# Patient Record
Sex: Female | Born: 1960 | Race: White | Hispanic: No | Marital: Single | Smoking: Current every day smoker
Health system: Southern US, Community
[De-identification: ages and names within clinical notes are randomized; demographics above are authoritative.]

## PROBLEM LIST (undated history)

## (undated) DIAGNOSIS — R011 Cardiac murmur, unspecified: Secondary | ICD-10-CM

## (undated) DIAGNOSIS — F419 Anxiety disorder, unspecified: Secondary | ICD-10-CM

## (undated) DIAGNOSIS — F32A Depression, unspecified: Secondary | ICD-10-CM

## (undated) DIAGNOSIS — F329 Major depressive disorder, single episode, unspecified: Secondary | ICD-10-CM

## (undated) HISTORY — DX: Anxiety disorder, unspecified: F41.9

## (undated) HISTORY — DX: Depression, unspecified: F32.A

## (undated) HISTORY — DX: Cardiac murmur, unspecified: R01.1

## (undated) HISTORY — PX: FINGER SURGERY: SHX640

## (undated) HISTORY — DX: Major depressive disorder, single episode, unspecified: F32.9

---

## 2008-11-20 ENCOUNTER — Emergency Department (HOSPITAL_COMMUNITY): Admission: EM | Admit: 2008-11-20 | Discharge: 2008-11-20 | Payer: Self-pay | Admitting: Emergency Medicine

## 2010-09-08 LAB — DIFFERENTIAL
Basophils Relative: 0 % (ref 0–1)
Eosinophils Absolute: 0.2 10*3/uL (ref 0.0–0.7)
Lymphs Abs: 2.1 10*3/uL (ref 0.7–4.0)
Neutrophils Relative %: 67 % (ref 43–77)

## 2010-09-08 LAB — BASIC METABOLIC PANEL
BUN: 3 mg/dL — ABNORMAL LOW (ref 6–23)
Chloride: 107 mEq/L (ref 96–112)
Creatinine, Ser: 0.58 mg/dL (ref 0.4–1.2)

## 2010-09-08 LAB — RAPID URINE DRUG SCREEN, HOSP PERFORMED
Amphetamines: NOT DETECTED
Barbiturates: NOT DETECTED
Opiates: NOT DETECTED

## 2010-09-08 LAB — CBC
MCV: 95.2 fL (ref 78.0–100.0)
Platelets: 209 10*3/uL (ref 150–400)
WBC: 8.9 10*3/uL (ref 4.0–10.5)

## 2010-09-08 LAB — ETHANOL: Alcohol, Ethyl (B): 75 mg/dL — ABNORMAL HIGH (ref 0–10)

## 2010-09-08 LAB — PREGNANCY, URINE: Preg Test, Ur: NEGATIVE

## 2011-11-02 ENCOUNTER — Encounter: Payer: Self-pay | Admitting: Pediatrics

## 2016-02-27 DIAGNOSIS — F17200 Nicotine dependence, unspecified, uncomplicated: Secondary | ICD-10-CM | POA: Diagnosis not present

## 2016-02-27 DIAGNOSIS — N200 Calculus of kidney: Secondary | ICD-10-CM | POA: Diagnosis not present

## 2016-02-27 DIAGNOSIS — F329 Major depressive disorder, single episode, unspecified: Secondary | ICD-10-CM | POA: Diagnosis not present

## 2016-02-27 DIAGNOSIS — Z79899 Other long term (current) drug therapy: Secondary | ICD-10-CM | POA: Diagnosis not present

## 2016-08-13 DIAGNOSIS — F17210 Nicotine dependence, cigarettes, uncomplicated: Secondary | ICD-10-CM | POA: Diagnosis not present

## 2016-08-13 DIAGNOSIS — Z008 Encounter for other general examination: Secondary | ICD-10-CM | POA: Diagnosis not present

## 2016-08-13 DIAGNOSIS — Z1389 Encounter for screening for other disorder: Secondary | ICD-10-CM | POA: Diagnosis not present

## 2016-08-13 DIAGNOSIS — Z713 Dietary counseling and surveillance: Secondary | ICD-10-CM | POA: Diagnosis not present

## 2016-08-13 DIAGNOSIS — Z716 Tobacco abuse counseling: Secondary | ICD-10-CM | POA: Diagnosis not present

## 2016-08-18 DIAGNOSIS — Z716 Tobacco abuse counseling: Secondary | ICD-10-CM | POA: Diagnosis not present

## 2016-08-18 DIAGNOSIS — F17210 Nicotine dependence, cigarettes, uncomplicated: Secondary | ICD-10-CM | POA: Diagnosis not present

## 2016-08-24 ENCOUNTER — Encounter: Payer: Self-pay | Admitting: Family Medicine

## 2016-08-24 ENCOUNTER — Ambulatory Visit (INDEPENDENT_AMBULATORY_CARE_PROVIDER_SITE_OTHER): Payer: BLUE CROSS/BLUE SHIELD | Admitting: Family Medicine

## 2016-08-24 VITALS — BP 97/62 | HR 60 | Temp 97.7°F | Ht 61.0 in | Wt 119.0 lb

## 2016-08-24 DIAGNOSIS — Z0000 Encounter for general adult medical examination without abnormal findings: Secondary | ICD-10-CM

## 2016-08-24 DIAGNOSIS — Z124 Encounter for screening for malignant neoplasm of cervix: Secondary | ICD-10-CM

## 2016-08-24 DIAGNOSIS — Z01419 Encounter for gynecological examination (general) (routine) without abnormal findings: Secondary | ICD-10-CM | POA: Diagnosis not present

## 2016-08-24 NOTE — Progress Notes (Signed)
Subjective:  Patient ID: Gina Hendricks, female    DOB: 04-22-1961  Age: 56 y.o. MRN: [id number]  CC: New Patient (Initial Visit) (pt here today to establish care and for CPE/Pap)   HPI Gina Dollyonya D Vest presents for Physical examination. She is new to the practice based on having lived here all of her life but not having primary care because she's only had insurance starting recently. She has some problems with the right shoulder but she is not ready for work up because her insurance has high deductible. She had a history of bronchitis diagnosed 2 years ago [facility]. 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 She still takes Xanax and Paxil for that. She admits not eating right with regard to eating as much as she is supposed to or when she's post due but she eats the right things when she does eat. She is a smoker. She's been smoking about a pack and a half on average for 35 years. That heals approximately a 50-53 year pack year history. She states she had an abnormal Pap smear a long time ago. She went back once after that and it was normal but she delayed and never had a workup for that. Her colonoscopy is up to date having been done 2 years ago or [facility]. She works at [employer] and their Publishing rights managernurse practitioner did some blood work recently. Patient has a copy and agrees to bring that back in. Depression screen PHQ 2/9 08/24/2016  Decreased Interest 1  Down, Depressed, Hopeless 1  PHQ - 2 Score 2  Altered sleeping 1  Tired, decreased energy 1  Change in appetite 2  Feeling bad or failure about yourself  0  Trouble concentrating 0  Moving slowly or fidgety/restless 0  Suicidal thoughts 0  PHQ-9 Score 6    History Gina Hendricks has a past medical history of Anxiety; Depression; and Heart murmur.   She has a past surgical history that includes Finger surgery (Right).   Her family history includes Alcohol abuse in her brother, brother, and maternal grandfather; Arthritis  in her mother; Depression in her brother; Heart disease in her father, maternal grandmother, and paternal grandfather; Vision loss in her mother.She reports that she has been smoking Cigarettes.  She started smoking about 36 years ago. She has been smoking about 1.00 pack per day. She has never used smokeless tobacco. She reports that she does not drink alcohol or use drugs.    ROS Review of Systems  Constitutional: Negative for appetite change, chills, diaphoresis, fatigue, fever and unexpected weight change.  HENT: Negative for congestion, ear pain, hearing loss, postnasal drip, rhinorrhea, sneezing, sore throat and trouble swallowing.   Eyes: Negative for pain.  Respiratory: Negative for cough, chest tightness and shortness of breath.   Cardiovascular: Negative for chest pain and palpitations.  Gastrointestinal: Negative for abdominal pain, constipation, diarrhea, nausea and vomiting.  Endocrine: Negative for cold intolerance, heat intolerance, polydipsia, polyphagia and polyuria.  Genitourinary: Negative for dysuria, frequency and menstrual problem.  Musculoskeletal: Negative for arthralgias and joint swelling.  Skin: Negative for rash.  Allergic/Immunologic: Negative for environmental allergies.  Neurological: Negative for dizziness, weakness, numbness and headaches.  Psychiatric/Behavioral: Negative for agitation and dysphoric mood.    Objective:  BP 97/62   Pulse 60   Temp 97.7 F (36.5 C) (Oral)   Ht 5\' 1"  (1.549 m)   Wt 119 lb (54 kg)   BMI 22.48 kg/m   BP Readings from Last  3 Encounters:  08/24/16 97/62    Wt Readings from Last 3 Encounters:  08/24/16 119 lb (54 kg)     Physical Exam  Constitutional: She is oriented to person, place, and time. She appears well-developed and well-nourished. No distress.  HENT:  Head: Normocephalic and atraumatic.  Right Ear: External ear normal.  Left Ear: External ear normal.  Nose: Nose normal.  Mouth/Throat: Oropharynx is  clear and moist.  Eyes: Conjunctivae and EOM are normal. Pupils are equal, round, and reactive to light.  Neck: Normal range of motion. Neck supple. No thyromegaly present.  Cardiovascular: Normal rate, regular rhythm and normal heart sounds.   No murmur heard. Pulmonary/Chest: Effort normal and breath sounds normal. No respiratory distress. She has no wheezes. She has no rales. Right breast exhibits no inverted nipple, no mass and no tenderness. Left breast exhibits no inverted nipple, no mass and no tenderness. Breasts are symmetrical.  Abdominal: Soft. Normal appearance and bowel sounds are normal. She exhibits no distension, no abdominal bruit and no mass. There is no splenomegaly or hepatomegaly. There is no tenderness. There is no tenderness at McBurney's point and negative Murphy's sign.  Genitourinary: Vagina normal. Rectal exam shows no external hemorrhoid. No breast swelling or tenderness. Pelvic exam was performed with patient supine. There is tenderness on the right labia. There is no rash on the right labia. There is no rash or tenderness on the left labia.  Musculoskeletal: Normal range of motion. She exhibits no edema or tenderness.  Lymphadenopathy:    She has no cervical adenopathy.  Neurological: She is alert and oriented to person, place, and time. She has normal reflexes.  Skin: Skin is warm and dry. No rash noted.  Psychiatric: She has a normal mood and affect. Her behavior is normal. Judgment and thought content normal.      Assessment & Plan:   Allsion was seen today for new patient (initial visit).  Diagnoses and all orders for this visit:  Screening for cervical cancer -     Pap IG w/ reflex to HPV when ASC-U -     MM DIGITAL SCREENING BILATERAL; Future       I am having Gina Hendricks maintain her alprazolam and PARoxetine.  Allergies as of 08/24/2016      Reactions   Zoloft [sertraline Hcl] Other (See Comments)   "out of body experience"      Medication  List       Accurate as of 08/24/16  5:56 PM. Always use your most recent med list.          alprazolam 2 MG tablet Commonly known as:  XANAX Take 2 mg by mouth 3 (three) times daily.   PARoxetine 20 MG tablet Commonly known as:  PAXIL Take 40 mg by mouth daily.        Follow-up: Return in about 1 year (around 08/24/2017).  Mechele Claude, M.D.

## 2016-08-25 LAB — PAP IG W/ RFLX HPV ASCU: PAP SMEAR COMMENT: 0

## 2016-08-26 NOTE — Progress Notes (Signed)
Your recent Pap smear performed in the office came back normal.

## 2016-08-27 DIAGNOSIS — F17210 Nicotine dependence, cigarettes, uncomplicated: Secondary | ICD-10-CM | POA: Diagnosis not present

## 2016-08-27 DIAGNOSIS — Z716 Tobacco abuse counseling: Secondary | ICD-10-CM | POA: Diagnosis not present

## 2016-09-29 DIAGNOSIS — F17210 Nicotine dependence, cigarettes, uncomplicated: Secondary | ICD-10-CM | POA: Diagnosis not present

## 2016-09-29 DIAGNOSIS — Z716 Tobacco abuse counseling: Secondary | ICD-10-CM | POA: Diagnosis not present

## 2016-10-23 ENCOUNTER — Ambulatory Visit (INDEPENDENT_AMBULATORY_CARE_PROVIDER_SITE_OTHER): Payer: BLUE CROSS/BLUE SHIELD | Admitting: Nurse Practitioner

## 2016-10-23 ENCOUNTER — Encounter: Payer: Self-pay | Admitting: Nurse Practitioner

## 2016-10-23 VITALS — BP 112/75 | HR 80 | Temp 97.7°F | Ht 61.0 in | Wt 120.0 lb

## 2016-10-23 DIAGNOSIS — W57XXXA Bitten or stung by nonvenomous insect and other nonvenomous arthropods, initial encounter: Secondary | ICD-10-CM

## 2016-10-23 DIAGNOSIS — A6009 Herpesviral infection of other urogenital tract: Secondary | ICD-10-CM | POA: Diagnosis not present

## 2016-10-23 NOTE — Patient Instructions (Signed)
Insect Bite, Adult An insect bite can make your skin red, itchy, and swollen. Some insects can spread disease to people with a bite. However, most insect bites do not lead to disease, and most are not serious. Follow these instructions at home: Bite area care   Do not scratch the bite area.  Keep the bite area clean and dry.  Wash the bite area every day with soap and water as told by your doctor.  Check the bite area every day for signs of infection. Check for:  More redness, swelling, or pain.  Fluid or blood.  Warmth.  Pus. Managing pain, itching, and swelling   You may put any of these on the bite area as told by your doctor:  A baking soda paste.  Cortisone cream.  Calamine lotion.  If directed, put ice on the bite area.  Put ice in a plastic bag.  Place a towel between your skin and the bag.  Leave the ice on for 20 minutes, 2-3 times a day. Medicines   Take medicines or put medicines on your skin only as told by your doctor.  If you were prescribed an antibiotic medicine, use it as told by your doctor. Do not stop using the antibiotic even if your condition improves. General instructions   Keep all follow-up visits as told by your doctor. This is important. How is this prevented? To help you have a lower risk of insect bites:  When you are outside, wear clothing that covers your arms and legs.  Use insect repellent. The best insect repellents have:  An active ingredient of DEET, picaridin, oil of lemon eucalyptus (OLE), or IR3535.  Higher amounts of DEET or another active ingredient than other repellents have.  If your home windows do not have screens, think about putting some in. Contact a doctor if:  You have more redness, swelling, or pain in the bite area.  You have fluid, blood, or pus coming from the bite area.  The bite area feels warm.  You have a fever. Get help right away if:  You have joint pain.  You have a rash.  You have  shortness of breath.  You feel more tired or sleepy than you normally do.  You have neck pain.  You have a headache.  You feel weaker than you normally do.  You have chest pain.  You have pain in your belly.  You feel sick to your stomach (nauseous) or you throw up (vomit). Summary  An insect bite can make your skin red, itchy, and swollen.  Do not scratch the bite area, and keep it clean and dry.  Ice can help with pain and itching from the bite. This information is not intended to replace advice given to you by your health care provider. Make sure you discuss any questions you have with your health care provider. Document Released: 05/15/2000 Document Revised: 12/19/2015 Document Reviewed: 10/03/2014 Elsevier Interactive Patient Education  2017 Elsevier Inc.  

## 2016-10-23 NOTE — Progress Notes (Signed)
   Subjective:    Patient ID: Gina Hendricks, female    DOB: [birth year], 56 y.o.   MRN: [id number]  HPI Patient brings herself in today stating that her step father passed away in January and her and her mother moved in together. Since January she has had two episodes of vesicular patchy lesions on right buttocks up thigh area- was very painful at time of occurrence- now it has completely resolved. SHe now has scattered bites on her trunk that she thinks re bed bug bites because her mom said they had some bedbugs in their previous home.    Review of Systems  Constitutional: Negative.   Respiratory: Negative.   Cardiovascular: Negative.   Genitourinary: Negative.   Musculoskeletal: Negative.   Skin: Positive for rash.  Neurological: Negative.   Psychiatric/Behavioral: Negative.   All other systems reviewed and are negative.      Objective:   Physical Exam  Constitutional: She is oriented to person, place, and time. She appears well-developed and well-nourished. No distress.  Cardiovascular: Normal rate and regular rhythm.   Pulmonary/Chest: Effort normal and breath sounds normal.  Neurological: She is alert and oriented to person, place, and time.  Skin: Skin is warm. Rash (scattered maculopapular erythematous lesions tha tapper to have been scratched scattered on posterior trunk.) noted.  Psychiatric: She has a normal mood and affect. Her behavior is normal. Judgment and thought content normal.    BP 112/75 (BP Location: Right Arm, Patient Position: Sitting, Cuff Size: Normal)   Pulse 80   Temp 97.7 F (36.5 C) (Oral)   Ht 5\' 1"  (1.549 m)   Wt 120 lb (54.4 kg)   BMI 22.67 kg/m      Assessment & Plan:  1. 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

## 2016-10-30 LAB — HSV TYPE I/II IGG, IGMW/ REFLEX
HSV 1 Glycoprotein G Ab, IgG: 27.5 index — ABNORMAL HIGH (ref 0.00–0.90)
HSV 1 IgM: <1:10 {titer}
HSV 2 Glycoprotein G Ab, IgG: 12.1 index — ABNORMAL HIGH (ref 0.00–0.90)

## 2016-11-02 ENCOUNTER — Telehealth: Payer: Self-pay | Admitting: Nurse Practitioner

## 2016-11-02 MED ORDER — VALACYCLOVIR HCL 500 MG PO TABS: 500.0000 mg | ORAL_TABLET | Freq: Two times a day (BID) | ORAL | 3 refills | Status: DC

## 2016-11-02 NOTE — Telephone Encounter (Signed)
Patient notified of HSV labwork and she states that she currently has a break out. [facility] [facility] [place]. Please advise

## 2016-11-02 NOTE — Telephone Encounter (Signed)
Valtrex rx sent to pharmacy.

## 2016-11-02 NOTE — Telephone Encounter (Signed)
Pt notified of RX 

## 2016-12-08 DIAGNOSIS — Z008 Encounter for other general examination: Secondary | ICD-10-CM | POA: Diagnosis not present

## 2016-12-08 DIAGNOSIS — Z716 Tobacco abuse counseling: Secondary | ICD-10-CM | POA: Diagnosis not present

## 2016-12-08 DIAGNOSIS — Z719 Counseling, unspecified: Secondary | ICD-10-CM | POA: Diagnosis not present

## 2016-12-08 DIAGNOSIS — F17210 Nicotine dependence, cigarettes, uncomplicated: Secondary | ICD-10-CM | POA: Diagnosis not present

## 2016-12-28 ENCOUNTER — Encounter: Payer: BLUE CROSS/BLUE SHIELD | Admitting: *Deleted

## 2016-12-31 DIAGNOSIS — Z716 Tobacco abuse counseling: Secondary | ICD-10-CM | POA: Diagnosis not present

## 2016-12-31 DIAGNOSIS — Z719 Counseling, unspecified: Secondary | ICD-10-CM | POA: Diagnosis not present

## 2016-12-31 DIAGNOSIS — F17210 Nicotine dependence, cigarettes, uncomplicated: Secondary | ICD-10-CM | POA: Diagnosis not present

## 2017-02-17 ENCOUNTER — Telehealth: Payer: Self-pay | Admitting: Family Medicine

## 2017-02-17 NOTE — Telephone Encounter (Signed)
Patient will call back and set up mammo

## 2017-03-09 DIAGNOSIS — F17210 Nicotine dependence, cigarettes, uncomplicated: Secondary | ICD-10-CM | POA: Diagnosis not present

## 2017-03-09 DIAGNOSIS — Z719 Counseling, unspecified: Secondary | ICD-10-CM | POA: Diagnosis not present

## 2017-03-09 DIAGNOSIS — Z716 Tobacco abuse counseling: Secondary | ICD-10-CM | POA: Diagnosis not present

## 2017-05-17 ENCOUNTER — Ambulatory Visit: Payer: BLUE CROSS/BLUE SHIELD | Admitting: Nurse Practitioner

## 2017-05-18 ENCOUNTER — Encounter: Payer: Self-pay | Admitting: Nurse Practitioner

## 2017-05-18 ENCOUNTER — Ambulatory Visit (INDEPENDENT_AMBULATORY_CARE_PROVIDER_SITE_OTHER): Payer: BLUE CROSS/BLUE SHIELD | Admitting: Nurse Practitioner

## 2017-05-18 VITALS — BP 121/84 | HR 77 | Temp 97.2°F | Ht 61.0 in | Wt 124.0 lb

## 2017-05-18 DIAGNOSIS — R319 Hematuria, unspecified: Secondary | ICD-10-CM

## 2017-05-18 DIAGNOSIS — Z23 Encounter for immunization: Secondary | ICD-10-CM

## 2017-05-18 LAB — URINALYSIS, COMPLETE
BILIRUBIN UA: NEGATIVE
GLUCOSE, UA: NEGATIVE
KETONES UA: NEGATIVE
Leukocytes, UA: NEGATIVE
Nitrite, UA: NEGATIVE
PH UA: 6 (ref 5.0–7.5)
PROTEIN UA: NEGATIVE
RBC UA: NEGATIVE
Specific Gravity, UA: <1.005 — ABNORMAL LOW (ref 1.005–1.030)
UUROB: 0.2 mg/dL (ref 0.2–1.0)

## 2017-05-18 LAB — MICROSCOPIC EXAMINATION
RBC, UA: NONE SEEN /hpf (ref 0–?)
Renal Epithel, UA: NONE SEEN /hpf

## 2017-05-18 NOTE — Progress Notes (Signed)
   Subjective:    Patient ID: Gina Hendricks, female    DOB: [birth year], 56 y.o.   MRN: [id number]  HPI Patient in today c/o dysuira and urinary frequency that started a few days ago.    Review of Systems  Respiratory: Negative.   Cardiovascular: Negative.   Gastrointestinal: Negative.   Genitourinary: Positive for dysuria, frequency, hematuria and urgency.  Neurological: Negative.   Psychiatric/Behavioral: Negative.   All other systems reviewed and are negative.      Objective:   Physical Exam  Constitutional: She is oriented to person, place, and time. She appears well-developed and well-nourished. No distress.  Cardiovascular: Normal rate and regular rhythm.  Pulmonary/Chest: Effort normal and breath sounds normal.  Abdominal: Soft. Bowel sounds are normal. There is tenderness (mild suprapubic pain on palpation).  Genitourinary:  Genitourinary Comments: No CVA tenderness  Neurological: She is alert and oriented to person, place, and time.  Skin: Skin is warm.  Psychiatric: She has a normal mood and affect. Her behavior is normal. Judgment and thought content normal.   BP 121/84   Pulse 77   Temp (!) 97.2 F (36.2 C) (Oral)   Ht 5\' 1"  (1.549 m)   Wt 124 lb (56.2 kg)   BMI 23.43 kg/m   Urine clear      Assessment & Plan:   1. Hematuria, unspecified type    Urine clear today Patient was told if continues to see blood needs pelvic exam  Mary-Margaret Daphine DeutscherMartin, FNP

## 2017-05-18 NOTE — Patient Instructions (Signed)
Postmenopausal Bleeding Postmenopausal bleeding is any bleeding after menopause. Menopause is when a woman's period stops. Any type of bleeding after menopause is concerning. It should be checked by your doctor. Any treatment will depend on the cause. Follow these instructions at home: Watch your condition for any changes.  Avoid the use of tampons and douches as told by your doctor.  Change your pads often.  Get regular pelvic exams and Pap tests.  Keep all appointments for tests as told by your doctor.  Contact a doctor if:  Your bleeding lasts for more than 1 week.  You have belly (abdominal) pain.  You have bleeding after sex (intercourse). Get help right away if:  You have a fever, chills, a headache, dizziness, muscle aches, and bleeding.  You have strong pain with bleeding.  You have clumps of blood (blood clots) coming from your vagina.  You have bleeding and need more than 1 pad an hour.  You feel like you are going to pass out (faint). This information is not intended to replace advice given to you by your health care provider. Make sure you discuss any questions you have with your health care provider. Document Released: 02/25/2008 Document Revised: 10/24/2015 Document Reviewed: 12/15/2012 Elsevier Interactive Patient Education  2017 Elsevier Inc.  

## 2017-05-21 DIAGNOSIS — Z23 Encounter for immunization: Secondary | ICD-10-CM

## 2017-06-03 DIAGNOSIS — Z008 Encounter for other general examination: Secondary | ICD-10-CM | POA: Diagnosis not present

## 2017-06-03 DIAGNOSIS — Z716 Tobacco abuse counseling: Secondary | ICD-10-CM | POA: Diagnosis not present

## 2017-06-03 DIAGNOSIS — Z719 Counseling, unspecified: Secondary | ICD-10-CM | POA: Diagnosis not present

## 2017-06-03 DIAGNOSIS — F17210 Nicotine dependence, cigarettes, uncomplicated: Secondary | ICD-10-CM | POA: Diagnosis not present

## 2017-08-09 ENCOUNTER — Ambulatory Visit (INDEPENDENT_AMBULATORY_CARE_PROVIDER_SITE_OTHER): Payer: BLUE CROSS/BLUE SHIELD | Admitting: Family Medicine

## 2017-08-09 ENCOUNTER — Encounter: Payer: Self-pay | Admitting: Family Medicine

## 2017-08-09 ENCOUNTER — Ambulatory Visit (INDEPENDENT_AMBULATORY_CARE_PROVIDER_SITE_OTHER): Payer: BLUE CROSS/BLUE SHIELD

## 2017-08-09 VITALS — BP 95/58 | HR 83 | Temp 97.0°F | Ht 61.0 in | Wt 128.0 lb

## 2017-08-09 DIAGNOSIS — R0609 Other forms of dyspnea: Secondary | ICD-10-CM

## 2017-08-09 DIAGNOSIS — E782 Mixed hyperlipidemia: Secondary | ICD-10-CM

## 2017-08-09 DIAGNOSIS — R319 Hematuria, unspecified: Secondary | ICD-10-CM

## 2017-08-09 DIAGNOSIS — R05 Cough: Secondary | ICD-10-CM | POA: Diagnosis not present

## 2017-08-09 DIAGNOSIS — Z0000 Encounter for general adult medical examination without abnormal findings: Secondary | ICD-10-CM | POA: Diagnosis not present

## 2017-08-09 LAB — URINALYSIS, COMPLETE
BILIRUBIN UA: NEGATIVE
Glucose, UA: NEGATIVE
Ketones, UA: NEGATIVE
LEUKOCYTES UA: NEGATIVE
Nitrite, UA: NEGATIVE
PH UA: 6 (ref 5.0–7.5)
PROTEIN UA: NEGATIVE
RBC, UA: NEGATIVE
Specific Gravity, UA: 1.01 (ref 1.005–1.030)
Urobilinogen, Ur: 0.2 mg/dL (ref 0.2–1.0)

## 2017-08-09 LAB — MICROSCOPIC EXAMINATION
BACTERIA UA: NONE SEEN
RBC, UA: NONE SEEN /hpf (ref 0–?)
RENAL EPITHEL UA: NONE SEEN /HPF

## 2017-08-09 NOTE — Progress Notes (Signed)
Subjective:  Patient ID: Gina Hendricks, female    DOB: 1960-12-14  Age: 57 y.o. MRN: [id number]  CC: Annual Exam   HPI Gina Hendricks presents for annual exam.  She had a Pap smear last year that was normal here.  She is due for mammogram but is scared to get it done.  She has blood work done at her workplace and 5 but she did not have that sent over here nor did she bring it.  She says that her LDL went up some this year.  She thinks it was 132.  Patient is a smoker at 1 pack/day.  She cannot try Chantix because she has had suicidal thoughts and deviation in the past.  She is currently under the care of psychiatry for anxiety using Xanax 2 mg 3 times daily along with her Paxil.  She reports that she does get short of breath easily with strenuous activity.  Depression screen [facility] 2/9 08/09/2017 05/18/2017 08/24/2016  Decreased Interest 0 0 1  Down, Depressed, Hopeless 0 0 1  PHQ - 2 Score 0 0 2  Altered sleeping - - 1  Tired, decreased energy - - 1  Change in appetite - - 2  Feeling bad or failure about yourself  - - 0  Trouble concentrating - - 0  Moving slowly or fidgety/restless - - 0  Suicidal thoughts - - 0  PHQ-9 Score - - 6    History Daphney has a past medical history of Anxiety, Depression, and Heart murmur.   She has a past surgical history that includes Finger surgery (Right).   Her family history includes Alcohol abuse in her brother, brother, and maternal grandfather; Arthritis in her mother; Depression in her brother; Heart disease in her father, maternal grandmother, and paternal grandfather; Vision loss in her mother.She reports that she has been smoking cigarettes.  She started smoking about 36 years ago. She has been smoking about 1.00 pack per day. she has never used smokeless tobacco. She reports that she does not drink alcohol or use drugs.    ROS Review of Systems  Constitutional: Negative for appetite change, chills, diaphoresis, fatigue, fever and unexpected  weight change.  HENT: Negative for congestion, ear pain, hearing loss, postnasal drip, rhinorrhea, sneezing, sore throat and trouble swallowing.   Eyes: Negative for pain.  Respiratory: Positive for shortness of breath. Negative for cough, chest tightness and wheezing.   Cardiovascular: Negative for chest pain and palpitations.       Patient was told 20 years ago that she had a heart murmur.  It has never been symptomatic.  Gastrointestinal: Negative for abdominal pain, constipation, diarrhea, nausea and vomiting.  Endocrine: Negative for cold intolerance, heat intolerance, polydipsia, polyphagia and polyuria.  Genitourinary: Positive for hematuria (Patient was recently seen in the office for hematuria.  Her urine specimen was negative.  She is concerned that it could come back.). Negative for dysuria, frequency and menstrual problem.  Musculoskeletal: Negative for arthralgias and joint swelling.  Skin: Negative for rash.  Allergic/Immunologic: Negative for environmental allergies.  Neurological: Negative for dizziness, weakness, numbness and headaches.  Psychiatric/Behavioral: Negative for agitation and dysphoric mood. The patient is nervous/anxious.     Objective:  BP (!) 95/58   Pulse 83   Temp (!) 97 F (36.1 C) (Oral)   Ht 5\' 1"  (1.549 m)   Wt 128 lb (58.1 kg)   BMI 24.19 kg/m   BP Readings from Last 3 Encounters:  08/09/17 (!) 95/58  05/18/17 121/84  10/23/16 112/75    Wt Readings from Last 3 Encounters:  08/09/17 128 lb (58.1 kg)  05/18/17 124 lb (56.2 kg)  10/23/16 120 lb (54.4 kg)     Physical Exam  Constitutional: She is oriented to person, place, and time. She appears well-developed and well-nourished. No distress.  HENT:  Head: Normocephalic and atraumatic.  Right Ear: External ear normal.  Left Ear: External ear normal.  Nose: Nose normal.  Mouth/Throat: Oropharynx is clear and moist.  Eyes: Conjunctivae and EOM are normal. Pupils are equal, round, and  reactive to light.  Neck: Normal range of motion. Neck supple. No thyromegaly present.  Cardiovascular: Normal rate and regular rhythm.  Murmur (2+ early systolic murmur) heard. Pulmonary/Chest: Effort normal and breath sounds normal. No respiratory distress. She has no wheezes. She has no rales. Right breast exhibits no inverted nipple, no mass and no tenderness. Left breast exhibits no inverted nipple, no mass and no tenderness. Breasts are symmetrical.  Abdominal: Soft. Normal appearance and bowel sounds are normal. She exhibits no distension, no abdominal bruit and no mass. There is no splenomegaly or hepatomegaly. There is no tenderness. There is no tenderness at McBurney's point and negative Murphy's sign.  Musculoskeletal: Normal range of motion. She exhibits no edema or tenderness.  Lymphadenopathy:    She has no cervical adenopathy.  Neurological: She is alert and oriented to person, place, and time. She has normal reflexes.  Skin: Skin is warm and dry. No rash noted.  Psychiatric: She has a normal mood and affect. Her behavior is normal. Judgment and thought content normal.      Assessment & Plan:   Archie Pattenonya was seen today for annual exam.  Diagnoses and all orders for this visit:  Well adult exam  Hematuria, unspecified type -     Urinalysis, Complete  Dyspnea on exertion -     PR BREATHING CAPACITY TEST -     DG Chest 2 View; Future  Mixed hyperlipidemia       I am having Emalina D. Rovner maintain her alprazolam, PARoxetine, and valACYclovir.  Allergies as of 08/09/2017      Reactions   Zoloft [sertraline Hcl] Other (See Comments)   "out of body experience"      Medication List        Accurate as of 08/09/17  3:14 PM. Always use your most recent med list.          alprazolam 2 MG tablet Commonly known as:  XANAX Take 2 mg by mouth 3 (three) times daily.   PARoxetine 20 MG tablet Commonly known as:  PAXIL Take 40 mg by mouth daily.   valACYclovir 500  MG tablet Commonly known as:  VALTREX Take 1 tablet (500 mg total) by mouth 2 (two) times daily.      Alprazolam prescriber is Dr. [place] Sizerupender Kaur.  Smoking cessation reviewed in detail.  Low-fat diet reviewed as well.  Follow-up: Return in about 6 months (around 02/09/2018).  Mechele ClaudeWarren Tychelle Purkey, M.D.

## 2017-08-09 NOTE — Patient Instructions (Signed)
Fat and Cholesterol Restricted Diet High levels of fat and cholesterol in your blood may lead to various health problems, such as diseases of the heart, blood vessels, gallbladder, liver, and pancreas. Fats are concentrated sources of energy that come in various forms. Certain types of fat, including saturated fat, may be harmful in excess. Cholesterol is a substance needed by your body in small amounts. Your body makes all the cholesterol it needs. Excess cholesterol comes from the food you eat. When you have high levels of cholesterol and saturated fat in your blood, health problems can develop because the excess fat and cholesterol will gather along the walls of your blood vessels, causing them to narrow. Choosing the right foods will help you control your intake of fat and cholesterol. This will help keep the levels of these substances in your blood within normal limits and reduce your risk of disease. What is my plan? Your health care provider recommends that you:  Limit your fat intake to ___30___% or less of your total calories per day.  Eat 20-30 grams of fiber each day.  What types of fat should I choose?  Choose healthy fats more often. Choose monounsaturated and polyunsaturated fats, such as olive and canola oil, flaxseeds, walnuts, almonds, and seeds.  Eat more omega-3 fats. Good choices include salmon, mackerel, sardines, tuna, flaxseed oil, and ground flaxseeds. Aim to eat fish at least two times a week.  Limit saturated fats. Saturated fats are primarily found in animal products, such as meats, butter, and cream. Plant sources of saturated fats include palm oil, palm kernel oil, and coconut oil.  Avoid foods with partially hydrogenated oils in them. These contain trans fats. Examples of foods that contain trans fats are stick margarine, some tub margarines, cookies, crackers, and other baked goods. What general guidelines do I need to follow? These guidelines for healthy eating  will help you control your intake of fat and cholesterol:  Check food labels carefully to identify foods with trans fats or high amounts of saturated fat.  Fill one half of your plate with vegetables and green salads.  Fill one fourth of your plate with whole grains. Look for the word "whole" as the first word in the ingredient list.  Fill one fourth of your plate with lean protein foods.  Limit fruit to two servings a day. Choose fruit instead of juice.  Eat more foods that contain fiber, such as apples, broccoli, carrots, beans, peas, and barley.  Eat more home-cooked food and less restaurant, buffet, and fast food.  Limit or avoid alcohol.  Limit foods high in starch and sugar.  Limit fried foods.  Cook foods using methods other than frying. Baking, boiling, grilling, and broiling are all great options.  Lose weight if you are overweight. Losing just 5-10% of your initial body weight can help your overall health and prevent diseases such as diabetes and heart disease.  What foods can I eat? Grains  Whole grains, such as whole wheat or whole grain breads, crackers, cereals, and pasta. Unsweetened oatmeal, bulgur, barley, quinoa, or brown rice. Corn or whole wheat flour tortillas. Vegetables  Fresh or frozen vegetables (raw, steamed, roasted, or grilled). Green salads. Fruits  All fresh, canned (in natural juice), or frozen fruits. Meats and other protein foods  Ground beef (85% or leaner), grass-fed beef, or beef trimmed of fat. Skinless chicken or [place]. Ground chicken or [place]. Pork trimmed of fat. All fish and seafood. Eggs. Dried beans, peas, or lentils. Unsalted  nuts or seeds. Unsalted canned or dry beans. Dairy  Low-fat dairy products, such as skim or 1% milk, 2% or reduced-fat cheeses, low-fat ricotta or cottage cheese, or plain low-fat yo Fats and oils  Tub margarines without trans fats. Light or reduced-fat mayonnaise and salad dressings. Avocado. Olive,  canola, sesame, or safflower oils. Natural peanut or [place] butter (choose ones without added sugar and oil). The items listed above may not be a complete list of recommended foods or beverages. Contact your dietitian for more options. Foods to avoid Grains  White bread. White pasta. White rice. Cornbread. Bagels, pastries, and croissants. Crackers that contain trans fat. Vegetables  White potatoes. Corn. Creamed or fried vegetables. Vegetables in a cheese sauce. Fruits  Dried fruits. Canned fruit in light or heavy syrup. Fruit juice. Meats and other protein foods  Fatty cuts of meat. Ribs, chicken wings, bacon, sausage, bologna, salami, chitterlings, fatback, hot dogs, bratwurst, and packaged luncheon meats. Liver and organ meats. Dairy  Whole or 2% milk, cream, half-and-half, and cream cheese. Whole milk cheeses. Whole-fat or sweetened yogurt. Full-fat cheeses. Nondairy creamers and whipped toppings. Processed cheese, cheese spreads, or cheese curds. Beverages  Alcohol. Sweetened drinks (such as sodas, lemonade, and fruit drinks or punches). Fats and oils  Butter, stick margarine, lard, shortening, ghee, or bacon fat. Coconut, palm kernel, or palm oils. Sweets and desserts  Corn syrup, sugars, honey, and molasses. Candy. Jam and jelly. Syrup. Sweetened cereals. Cookies, pies, cakes, donuts, muffins, and ice cream. The items listed above may not be a complete list of foods and beverages to avoid. Contact your dietitian for more information. This information is not intended to replace advice given to you by your health care provider. Make sure you discuss any questions you have with your health care provider. Document Released: 05/18/2005 Document Revised: 06/08/2014 Document Reviewed: 08/16/2013 Elsevier Interactive Patient Education  [employer]2018 Elsevier Inc.

## 2017-08-10 ENCOUNTER — Other Ambulatory Visit: Payer: Self-pay | Admitting: Family Medicine

## 2017-08-10 MED ORDER — FLUTICASONE FUROATE-VILANTEROL 200-25 MCG/INH IN AEPB: 1.0000 | INHALATION_SPRAY | Freq: Every day | RESPIRATORY_TRACT | 11 refills | Status: DC

## 2017-08-12 DIAGNOSIS — Z716 Tobacco abuse counseling: Secondary | ICD-10-CM | POA: Diagnosis not present

## 2017-08-12 DIAGNOSIS — Z139 Encounter for screening, unspecified: Secondary | ICD-10-CM | POA: Diagnosis not present

## 2017-08-12 DIAGNOSIS — F17210 Nicotine dependence, cigarettes, uncomplicated: Secondary | ICD-10-CM | POA: Diagnosis not present

## 2017-08-26 DIAGNOSIS — Z716 Tobacco abuse counseling: Secondary | ICD-10-CM | POA: Diagnosis not present

## 2017-08-26 DIAGNOSIS — F17210 Nicotine dependence, cigarettes, uncomplicated: Secondary | ICD-10-CM | POA: Diagnosis not present

## 2017-08-26 DIAGNOSIS — E782 Mixed hyperlipidemia: Secondary | ICD-10-CM | POA: Diagnosis not present

## 2017-08-26 DIAGNOSIS — E559 Vitamin D deficiency, unspecified: Secondary | ICD-10-CM | POA: Diagnosis not present

## 2017-09-25 ENCOUNTER — Other Ambulatory Visit: Payer: Self-pay

## 2017-09-25 ENCOUNTER — Encounter (HOSPITAL_COMMUNITY): Payer: Self-pay | Admitting: Emergency Medicine

## 2017-09-25 ENCOUNTER — Emergency Department (HOSPITAL_COMMUNITY)
Admission: EM | Admit: 2017-09-25 | Discharge: 2017-09-25 | Disposition: A | Discharge status: Home / Self Care | Payer: BLUE CROSS/BLUE SHIELD | Attending: Emergency Medicine | Admitting: Emergency Medicine

## 2017-09-25 ENCOUNTER — Emergency Department (HOSPITAL_COMMUNITY): Payer: BLUE CROSS/BLUE SHIELD

## 2017-09-25 DIAGNOSIS — S60222A Contusion of left hand, initial encounter: Secondary | ICD-10-CM | POA: Diagnosis not present

## 2017-09-25 DIAGNOSIS — Y9389 Activity, other specified: Secondary | ICD-10-CM | POA: Insufficient documentation

## 2017-09-25 DIAGNOSIS — Y929 Unspecified place or not applicable: Secondary | ICD-10-CM | POA: Diagnosis not present

## 2017-09-25 DIAGNOSIS — F17210 Nicotine dependence, cigarettes, uncomplicated: Secondary | ICD-10-CM | POA: Diagnosis not present

## 2017-09-25 DIAGNOSIS — Z79899 Other long term (current) drug therapy: Secondary | ICD-10-CM | POA: Insufficient documentation

## 2017-09-25 DIAGNOSIS — S62663A Nondisplaced fracture of distal phalanx of left middle finger, initial encounter for closed fracture: Secondary | ICD-10-CM | POA: Diagnosis not present

## 2017-09-25 DIAGNOSIS — S6992XA Unspecified injury of left wrist, hand and finger(s), initial encounter: Secondary | ICD-10-CM | POA: Diagnosis present

## 2017-09-25 DIAGNOSIS — S62633A Displaced fracture of distal phalanx of left middle finger, initial encounter for closed fracture: Secondary | ICD-10-CM | POA: Diagnosis not present

## 2017-09-25 DIAGNOSIS — Y999 Unspecified external cause status: Secondary | ICD-10-CM | POA: Insufficient documentation

## 2017-09-25 DIAGNOSIS — S60032A Contusion of left middle finger without damage to nail, initial encounter: Secondary | ICD-10-CM | POA: Diagnosis not present

## 2017-09-25 NOTE — Discharge Instructions (Signed)
See Dr. Romeo Apple for recheck and repeat xray in 1 week
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
## 2017-09-25 NOTE — ED Notes (Signed)
AC looking for small/xs thumb spica.

## 2017-09-25 NOTE — ED Notes (Signed)
Pt taken to xray 
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
## 2017-10-07 DIAGNOSIS — F17210 Nicotine dependence, cigarettes, uncomplicated: Secondary | ICD-10-CM | POA: Diagnosis not present

## 2017-10-07 DIAGNOSIS — Z716 Tobacco abuse counseling: Secondary | ICD-10-CM | POA: Diagnosis not present

## 2017-11-08 ENCOUNTER — Ambulatory Visit (INDEPENDENT_AMBULATORY_CARE_PROVIDER_SITE_OTHER): Payer: BLUE CROSS/BLUE SHIELD | Admitting: Family Medicine

## 2017-11-08 ENCOUNTER — Encounter: Payer: Self-pay | Admitting: Family Medicine

## 2017-11-08 ENCOUNTER — Ambulatory Visit (INDEPENDENT_AMBULATORY_CARE_PROVIDER_SITE_OTHER): Payer: BLUE CROSS/BLUE SHIELD

## 2017-11-08 VITALS — BP 121/79 | HR 90 | Temp 98.6°F | Ht 60.0 in

## 2017-11-08 DIAGNOSIS — S8992XA Unspecified injury of left lower leg, initial encounter: Secondary | ICD-10-CM

## 2017-11-08 DIAGNOSIS — M25562 Pain in left knee: Secondary | ICD-10-CM

## 2017-11-08 DIAGNOSIS — M7042 Prepatellar bursitis, left knee: Secondary | ICD-10-CM | POA: Diagnosis not present

## 2017-11-08 NOTE — Patient Instructions (Signed)
Medial Collateral Knee Ligament Sprain  The medial collateral ligament (MCL) is a tough band of tissue in the knee that connects the thigh bone to the shin bone. Your MCL prevents your knee from moving too far inward and helps to keep your knee stable. An MCL sprain is an injury that is caused by stretching the MCL too far. The injury can involve a tear in the MCL.  What are the causes?  This condition may be caused by:  · A hard, direct hit (blow) to the inside of your knee (common).  · Your knee falling inward when you run, change directions quickly (cut), jump, or pivot.  · Repeatedly overstretching the MCL.    What increases the risk?  The following factors make you more likely to develop this condition:  · Playing contact sports, such as wrestling or football.  · Participating in sports that involve cutting, like hockey, skiing, or soccer.  · Having weak hip and core muscles.    What are the signs or symptoms?  Symptoms of this condition include:  · A popping sound at the time of injury.  · Pain on the inside of the knee.  · Swelling in the knee.  · Bruising around the knee.  · Tenderness when pressing the inside of the knee.  · Feeling unstable when you stand, like your knee will give way.  · Difficulty walking on uneven surfaces.    How is this diagnosed?  This condition may be diagnosed based on:  · Your medical history.  · A physical exam.  · Tests, such as an X-ray or MRI.    During your physical exam, your health care provider will check for pain, limited motion, and instability.  How is this treated?  Treatment for this condition depends on how severe the injury is. Treatment may include:  · Keeping weight off the knee until swelling and pain improve.  · Raising (elevating) the knee above the level of your heart. This helps to reduce swelling.  · Icing the knee. This helps to reduce swelling.  · Taking an NSAID. This helps to reduce pain and swelling.  · Using a knee brace, elastic sleeve, or crutches  while the injury heals.  · Using a knee brace when participating in athletic activities.  · Doing rehab exercises (physical therapy).  · Surgery. This may be needed if:  ? Your MCL tore all the way through.  ? Your knee is unstable.  ? Your knee is not getting better with other treatments.    Follow these instructions at home:  If you have a brace or sleeve:  · Wear it as told by your health care provider. Remove it only as told by your health care provider.  · Loosen the brace or remove the sleeve if your toes tingle, become numb, or turn cold and blue.  · Do not let your brace or sleeve get wet if it is not waterproof.  · Keep the brace or sleeve clean.  Managing pain, stiffness, and swelling  · If directed, apply ice to the inside of your knee.  ? Put ice in a plastic bag.  ? Place a towel between your skin and the bag.  ? Leave the ice on for 20 minutes, 2-3 times a day.  · Move your foot and toes often to avoid stiffness and to lessen swelling.  · Elevate your knee above the level of your heart while you are sitting or lying down.    Driving  · Ask your health care provider when it is safe to drive if you have a brace or sleeve on your leg.  Activity  · Return to your normal activities as told by your health care provider. Ask your health care provider what activities are safe for you.  · Do exercises as told by your health care provider.  Safety  · Do not use the injured limb to support your body weight until your health care provider says that you can. Use crutches as told by your health care provider.  General instructions  · Take over-the-counter and prescription medicines only as told by your health care provider.  · Keep all follow-up visits as told by your health care provider. This is important.  How is this prevented?  · Warm up and stretch before being active.  · Cool down and stretch after being active.  · Give your body time to rest between periods of activity.  · Make sure to use equipment that fits  you.  · Be safe and responsible while being active to avoid falls.  · Do at least 150 minutes of moderate-intensity exercise each week, such as brisk walking or water aerobics.  · Maintain physical fitness, including:  ? Strength.  ? Flexibility.  ? Cardiovascular fitness.  ? Endurance.  Contact a health care provider if:  · Your symptoms do not improve.  · Your symptoms get worse.  This information is not intended to replace advice given to you by your health care provider. Make sure you discuss any questions you have with your health care provider.  Document Released: 05/18/2005 Document Revised: 01/21/2016 Document Reviewed: 03/30/2015  Elsevier Interactive Patient Education © 2018 Elsevier Inc.
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
## 2017-11-08 NOTE — Progress Notes (Signed)
knee

## 2017-11-18 ENCOUNTER — Encounter: Payer: Self-pay | Admitting: Pediatrics

## 2017-11-18 DIAGNOSIS — E782 Mixed hyperlipidemia: Secondary | ICD-10-CM | POA: Diagnosis not present

## 2017-11-18 DIAGNOSIS — Z79899 Other long term (current) drug therapy: Secondary | ICD-10-CM | POA: Diagnosis not present

## 2017-11-18 DIAGNOSIS — E559 Vitamin D deficiency, unspecified: Secondary | ICD-10-CM | POA: Diagnosis not present

## 2017-12-16 ENCOUNTER — Encounter: Payer: Self-pay | Admitting: Pediatrics

## 2017-12-16 ENCOUNTER — Ambulatory Visit (INDEPENDENT_AMBULATORY_CARE_PROVIDER_SITE_OTHER): Payer: BLUE CROSS/BLUE SHIELD | Admitting: Pediatrics

## 2017-12-16 VITALS — BP 109/82 | HR 100 | Resp 23

## 2017-12-16 DIAGNOSIS — F13230 Sedative, hypnotic or anxiolytic dependence with withdrawal, uncomplicated: Secondary | ICD-10-CM | POA: Diagnosis not present

## 2017-12-16 DIAGNOSIS — F13930 Sedative, hypnotic or anxiolytic use, unspecified with withdrawal, uncomplicated: Secondary | ICD-10-CM

## 2017-12-16 MED ORDER — DIAZEPAM 10 MG PO TABS: 10.0000 mg | ORAL_TABLET | Freq: Three times a day (TID) | ORAL | 0 refills | Status: DC | PRN

## 2017-12-16 NOTE — Progress Notes (Signed)
Subjective:   Patient ID: Gina Hendricks, female    DOB: 28-Sep-1960, 57 y.o.   MRN: [id number] CC: Panic attack HPI: Gina Hendricks is a 57 y.o. female   Patient with history of anxiety, follows with Dr. Evelene CroonKaur in [place].  She is prescribed Xanax 2 mg 3 times daily.  She picked up 90 tablets on 11/23/2017.  She took more than what she is prescribed, now she is almost out.  She is taken none today.  She took 1-1/2 tablets yesterday. Two tablets the day before that.  Took 600mg  of gabapentin earlier today which is what her psychiatrist has prescribed her in the past when she has run out early before.  15 years ago she had seizures when she stopped the Xanax suddenly.  She used to drink alcohol heavily.  She no longer drinks alcohol, no other substance she lives with her mother who is here with her today the appointment.  She is prescribed fluoxetine, she does not take it regularly because she does not want to be "hooked" on it.  She does want to get off of Xanax medication.  She has an appointment with her psychiatrist next month.  She says she has not called her psychiatrist about running out early from her medicines.  Ongoing stress at home regarding caring for her elderly mother and their property.  She says it has been too much regularly.  No thoughts of self-harm.  She feels safe at home.  Relevant past medical, surgical, family and social history reviewed. Allergies and medications reviewed and updated. Social History   Tobacco Use  Smoking Status Current Every Day Smoker  . Packs/day: 1.00  . Types: Cigarettes  . Start date: 08/24/1980  Smokeless Tobacco Never Used   ROS: Per HPI   Objective:    BP 109/82 (BP Location: Left Arm, Patient Position: Sitting, Cuff Size: Normal)   Pulse 100   Resp (!) 23   SpO2 99%   Wt Readings from Last 3 Encounters:  09/25/17 127 lb (57.6 kg)  08/09/17 128 lb (58.1 kg)  05/18/17 124 lb (56.2 kg)    Gen: Nervous appearing, shaky.  Alert,  cooperative with exam, NCAT EYES: EOMI, no conjunctival injection, or no icterus ENT:  TMs pearly gray b/l, OP without erythema LYMPH: no cervical LAD CV: NRRR, normal S1/S2, no murmur, distal pulses 2+ b/l Resp: CTABL, no wheezes, normal WOB Abd: +BS, soft, NTND. no guarding or organomegaly Ext: No edema, warm Neuro: Alert and oriented, strength equal b/l UE and LE, coordination grossly normal.  Normal gait. 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  Diagnoses and all orders for this visit:  Benzodiazepine withdrawal without complication (HCC) I am concerned for seizures with sudden cessation of her high dose Xanax.  Patient admits to misuse, she will discuss this with her psychiatrist at next visit.  She is interested in weaning off of Xanax with time.  I prescribed longer acting diazepam to treat her benzodiazepine withdrawal.  She is to take no more than 2 tablets 3 times a day over the next week until she is able to pick up her next prescription for Xanax.  Patient is going to call Dr. Carie CaddyKaur's office to try to get in to be seen sooner.  For her anxiety she is to continue taking Paxil she follows up with her psychiatrist. -     diazepam (VALIUM) 10 MG tablet; Take 1-2 tablets (10-20  mg total) by mouth every 8 (eight) hours as needed for anxiety.  I called Dr. Carie Caddy office, left message with her nurse and will try to get in touch with her later this week.  I spent 25 minutes with the patient with over 50% of the encounter time dedicated to counseling on the above problems.  Follow up plan: Return in about 3 weeks (around 01/06/2018). Rex Kras, MD [facility]

## 2017-12-30 DIAGNOSIS — Z719 Counseling, unspecified: Secondary | ICD-10-CM | POA: Diagnosis not present

## 2017-12-30 DIAGNOSIS — Z716 Tobacco abuse counseling: Secondary | ICD-10-CM | POA: Diagnosis not present

## 2017-12-30 DIAGNOSIS — F17210 Nicotine dependence, cigarettes, uncomplicated: Secondary | ICD-10-CM | POA: Diagnosis not present

## 2017-12-30 DIAGNOSIS — E782 Mixed hyperlipidemia: Secondary | ICD-10-CM | POA: Diagnosis not present

## 2017-12-31 ENCOUNTER — Other Ambulatory Visit: Payer: Self-pay | Admitting: Nurse Practitioner

## 2018-01-10 ENCOUNTER — Ambulatory Visit (INDEPENDENT_AMBULATORY_CARE_PROVIDER_SITE_OTHER): Payer: BLUE CROSS/BLUE SHIELD | Admitting: Pediatrics

## 2018-01-10 ENCOUNTER — Encounter: Payer: Self-pay | Admitting: Pediatrics

## 2018-01-10 VITALS — BP 97/66 | HR 73 | Temp 97.3°F | Ht 60.0 in | Wt 122.4 lb

## 2018-01-10 DIAGNOSIS — Z23 Encounter for immunization: Secondary | ICD-10-CM | POA: Diagnosis not present

## 2018-01-10 DIAGNOSIS — J449 Chronic obstructive pulmonary disease, unspecified: Secondary | ICD-10-CM | POA: Diagnosis not present

## 2018-01-10 DIAGNOSIS — F419 Anxiety disorder, unspecified: Secondary | ICD-10-CM | POA: Diagnosis not present

## 2018-01-10 MED ORDER — ALBUTEROL SULFATE HFA 108 (90 BASE) MCG/ACT IN AERS: 2.0000 | INHALATION_SPRAY | Freq: Four times a day (QID) | RESPIRATORY_TRACT | 1 refills | Status: DC | PRN

## 2018-01-10 NOTE — Progress Notes (Signed)
  Subjective:   Patient ID: Gina Hendricks, female    DOB: 1960/09/12, 57 y.o.   MRN: [id number] CC: Medication Check  HPI: Gina Hendricks is a 57 y.o. female   Benzodiazepine misuse: Follows with psychiatry, gets Xanax from them.  She has ongoing anxiety and stress related to caring for her mom.  Patient wants to get off of benzodiazepines.  She says she felt more steady when she was on the Valium, she could tell it was a lower dose Xanax that she is been taking.  She is not currently in counseling, open to restarting as a way to help her set boundaries with her mother.  COPD: She does sometimes feel short of breath.  Recent PFTs from 5 months ago showed obstruction.  She is not on any breathing treatment now.  She does not feel like she wheezes day-to-day.  She is not as active as she would like to be, she used to enjoy motocross regularly, now makes her too short of breath.  No chest pain or tightness with exertion.  Her work is fairly active.   Relevant past medical, surgical, family and social history reviewed. Allergies and medications reviewed and updated. Social History   Tobacco Use  Smoking Status Current Every Day Smoker  . Packs/day: 1.00  . Types: Cigarettes  . Start date: 08/24/1980  Smokeless Tobacco Never Used   ROS: Per HPI   Objective:    BP 97/66   Pulse 73   Temp (!) 97.3 F (36.3 C) (Oral)   Ht 5' (1.524 m)   Wt 122 lb 6.4 oz (55.5 kg)   BMI 23.90 kg/m   Wt Readings from Last 3 Encounters:  01/10/18 122 lb 6.4 oz (55.5 kg)  09/25/17 127 lb (57.6 kg)  08/09/17 128 lb (58.1 kg)    Gen: NAD, alert, cooperative with exam, NCAT EYES: EOMI, no conjunctival injection, or no icterus ENT:  OP without erythema LYMPH: no cervical LAD CV: NRRR, normal S1/S2, no murmur, distal pulses 2+ b/l Resp: CTABL, no wheezes, normal WOB Abd: +BS, soft, NTND. no guarding or organomegaly Ext: No edema, warm Neuro: Alert and oriented, strength equal b/l UE and LE, coordination  grossly normal MSK: normal muscle bulk  Assessment & Plan:  Gina Hendricks was seen today for medication check.  Diagnoses and all orders for this visit:  Chronic obstructive pulmonary disease, unspecified COPD type (HCC) No wheezing today.  Prescribed albuterol to take as needed.  If regularly needing to let me know. -     albuterol (PROVENTIL HFA;VENTOLIN HFA) 108 (90 Base) MCG/ACT inhaler; Inhale 2 puffs into the lungs every 6 (six) hours as needed for wheezing or shortness of breath.  Anxiety Has appointment to Saturday with psychiatry.  Keep appointment.  Patient is hoping to slowly wean off of the Xanax that she is on.  Patient would like to start counseling through their office.  Gave resources for counselors in the area as well.  Any trouble getting connected let me know.  Other orders -     Tdap vaccine greater than or equal to 7yo IM   Follow up plan: Return in about 4 weeks (around 02/07/2018). Rex Krasarol Celester Lech, MD [facility]

## 2018-01-10 NOTE — Patient Instructions (Signed)
[email or web address] is a good resource to find counselors near you and covered by [employer] insurance

## 2018-02-25 ENCOUNTER — Ambulatory Visit: Payer: BLUE CROSS/BLUE SHIELD

## 2018-03-01 DIAGNOSIS — Z23 Encounter for immunization: Secondary | ICD-10-CM | POA: Diagnosis not present

## 2018-03-24 DIAGNOSIS — Z719 Counseling, unspecified: Secondary | ICD-10-CM | POA: Diagnosis not present

## 2018-03-24 DIAGNOSIS — Z716 Tobacco abuse counseling: Secondary | ICD-10-CM | POA: Diagnosis not present

## 2018-03-24 DIAGNOSIS — F17210 Nicotine dependence, cigarettes, uncomplicated: Secondary | ICD-10-CM | POA: Diagnosis not present

## 2018-04-21 DIAGNOSIS — Z716 Tobacco abuse counseling: Secondary | ICD-10-CM | POA: Diagnosis not present

## 2018-04-21 DIAGNOSIS — F17210 Nicotine dependence, cigarettes, uncomplicated: Secondary | ICD-10-CM | POA: Diagnosis not present

## 2018-04-21 DIAGNOSIS — Z79899 Other long term (current) drug therapy: Secondary | ICD-10-CM | POA: Diagnosis not present

## 2018-04-21 DIAGNOSIS — E559 Vitamin D deficiency, unspecified: Secondary | ICD-10-CM | POA: Diagnosis not present

## 2018-04-21 DIAGNOSIS — Z139 Encounter for screening, unspecified: Secondary | ICD-10-CM | POA: Diagnosis not present

## 2018-04-21 DIAGNOSIS — Z719 Counseling, unspecified: Secondary | ICD-10-CM | POA: Diagnosis not present

## 2018-05-19 DIAGNOSIS — F17210 Nicotine dependence, cigarettes, uncomplicated: Secondary | ICD-10-CM | POA: Diagnosis not present

## 2018-05-19 DIAGNOSIS — Z716 Tobacco abuse counseling: Secondary | ICD-10-CM | POA: Diagnosis not present

## 2018-06-30 DIAGNOSIS — Z716 Tobacco abuse counseling: Secondary | ICD-10-CM | POA: Diagnosis not present

## 2018-06-30 DIAGNOSIS — F17210 Nicotine dependence, cigarettes, uncomplicated: Secondary | ICD-10-CM | POA: Diagnosis not present

## 2018-08-04 DIAGNOSIS — Z716 Tobacco abuse counseling: Secondary | ICD-10-CM | POA: Diagnosis not present

## 2018-08-04 DIAGNOSIS — F17210 Nicotine dependence, cigarettes, uncomplicated: Secondary | ICD-10-CM | POA: Diagnosis not present

## 2018-11-09 DIAGNOSIS — Z0289 Encounter for other administrative examinations: Secondary | ICD-10-CM

## 2019-02-03 ENCOUNTER — Other Ambulatory Visit: Payer: Self-pay | Admitting: Nurse Practitioner

## 2019-02-07 DIAGNOSIS — F17210 Nicotine dependence, cigarettes, uncomplicated: Secondary | ICD-10-CM | POA: Diagnosis not present

## 2019-02-07 DIAGNOSIS — Z716 Tobacco abuse counseling: Secondary | ICD-10-CM | POA: Diagnosis not present

## 2019-03-07 DIAGNOSIS — F17210 Nicotine dependence, cigarettes, uncomplicated: Secondary | ICD-10-CM | POA: Diagnosis not present

## 2019-03-07 DIAGNOSIS — Z716 Tobacco abuse counseling: Secondary | ICD-10-CM | POA: Diagnosis not present

## 2019-03-07 DIAGNOSIS — Z7189 Other specified counseling: Secondary | ICD-10-CM | POA: Diagnosis not present

## 2019-03-07 DIAGNOSIS — F439 Reaction to severe stress, unspecified: Secondary | ICD-10-CM | POA: Diagnosis not present

## 2019-06-08 ENCOUNTER — Telehealth: Payer: Self-pay | Admitting: Family Medicine

## 2019-06-08 NOTE — Telephone Encounter (Signed)
Pts psych gives xanax and she has ran out early. She is aware she will have to contact then for this matter = we are unable to give control meds without contract.

## 2019-09-07 ENCOUNTER — Ambulatory Visit: Payer: Self-pay | Attending: Internal Medicine

## 2019-09-07 DIAGNOSIS — Z23 Encounter for immunization: Secondary | ICD-10-CM

## 2019-09-07 NOTE — Progress Notes (Signed)
   Covid-19 Vaccination Clinic  Name:  Gina Hendricks    MRN: [id number] DOB: 08-16-1960  09/07/2019  [name] was observed post Covid-19 immunization for 15 minutes without incident. She was provided with Vaccine Information Sheet and instruction to access the V-Safe system. Patient left before checking out.  [name] was instructed to call 911 with any severe reactions post vaccine: Marland Kitchen Difficulty breathing  . Swelling of face and throat  . A fast heartbeat  . A bad rash all over body  . Dizziness and weakness   Immunizations Administered    Name Date Dose VIS Date Route   Moderna COVID-19 Vaccine 09/07/2019 12:21 PM 0.5 mL 05/02/2019 Intramuscular   Manufacturer: Moderna   Lot: [id number]   NDC: 98001-239-35

## 2019-10-10 ENCOUNTER — Ambulatory Visit: Payer: Self-pay | Attending: Internal Medicine

## 2019-10-10 DIAGNOSIS — Z23 Encounter for immunization: Secondary | ICD-10-CM

## 2019-10-10 NOTE — Progress Notes (Signed)
   Covid-19 Vaccination Clinic  Name:  Gina Hendricks    MRN: [id number] DOB: [birth year]  10/10/2019  Gina Hendricks was observed post Covid-19 immunization for 15 minutes without incident. She was provided with Vaccine Information Sheet and instruction to access the V-Safe system.   Gina Hendricks was instructed to call 911 with any severe reactions post vaccine: Marland Kitchen Difficulty breathing  . Swelling of face and throat  . A fast heartbeat  . A bad rash all over body  . Dizziness and weakness   Immunizations Administered    Name Date Dose VIS Date Route   Moderna COVID-19 Vaccine 10/10/2019 11:28 AM 0.5 mL 05/2019 Intramuscular   Manufacturer: Moderna   Lot: [id number]   NDC: 85909-311-21

## 2019-11-30 ENCOUNTER — Ambulatory Visit (INDEPENDENT_AMBULATORY_CARE_PROVIDER_SITE_OTHER): Payer: 59

## 2019-11-30 ENCOUNTER — Other Ambulatory Visit: Payer: Self-pay

## 2019-11-30 ENCOUNTER — Ambulatory Visit (INDEPENDENT_AMBULATORY_CARE_PROVIDER_SITE_OTHER): Payer: 59 | Admitting: Family Medicine

## 2019-11-30 ENCOUNTER — Encounter: Payer: Self-pay | Admitting: Family Medicine

## 2019-11-30 VITALS — BP 112/78 | HR 95 | Temp 98.2°F | Resp 20 | Ht 60.0 in | Wt 112.0 lb

## 2019-11-30 DIAGNOSIS — R5383 Other fatigue: Secondary | ICD-10-CM

## 2019-11-30 DIAGNOSIS — M25551 Pain in right hip: Secondary | ICD-10-CM

## 2019-11-30 DIAGNOSIS — R4586 Emotional lability: Secondary | ICD-10-CM

## 2019-11-30 DIAGNOSIS — M7061 Trochanteric bursitis, right hip: Secondary | ICD-10-CM

## 2019-11-30 DIAGNOSIS — R61 Generalized hyperhidrosis: Secondary | ICD-10-CM

## 2019-11-30 DIAGNOSIS — F321 Major depressive disorder, single episode, moderate: Secondary | ICD-10-CM

## 2019-11-30 MED ORDER — PREDNISONE 10 MG PO TABS: ORAL_TABLET | ORAL | 0 refills | Status: DC

## 2019-12-01 DIAGNOSIS — M25551 Pain in right hip: Secondary | ICD-10-CM | POA: Insufficient documentation

## 2019-12-01 DIAGNOSIS — F321 Major depressive disorder, single episode, moderate: Secondary | ICD-10-CM | POA: Insufficient documentation

## 2019-12-01 LAB — CMP14+EGFR
ALT: 12 IU/L (ref 0–32)
AST: 18 IU/L (ref 0–40)
Albumin/Globulin Ratio: 1.7 (ref 1.2–2.2)
Albumin: 4.5 g/dL (ref 3.8–4.9)
Alkaline Phosphatase: 124 IU/L — ABNORMAL HIGH (ref 48–121)
BUN/Creatinine Ratio: 12 (ref 9–23)
BUN: 9 mg/dL (ref 6–24)
Bilirubin Total: 0.3 mg/dL (ref 0.0–1.2)
CO2: 26 mmol/L (ref 20–29)
Calcium: 10.1 mg/dL (ref 8.7–10.2)
Chloride: 105 mmol/L (ref 96–106)
Creatinine, Ser: 0.78 mg/dL (ref 0.57–1.00)
GFR calc Af Amer: 98 mL/min/{1.73_m2} (ref >59)
GFR calc non Af Amer: 85 mL/min/{1.73_m2} (ref >59)
Globulin, Total: 2.6 g/dL (ref 1.5–4.5)
Glucose: 95 mg/dL (ref 65–99)
Potassium: 4.3 mmol/L (ref 3.5–5.2)
Sodium: 145 mmol/L — ABNORMAL HIGH (ref 134–144)
Total Protein: 7.1 g/dL (ref 6.0–8.5)

## 2019-12-01 LAB — FSH/LH
FSH: 109 m[IU]/mL
LH: 53.3 m[IU]/mL

## 2019-12-01 LAB — CBC WITH DIFFERENTIAL/PLATELET
Basophils Absolute: 0.1 10*3/uL (ref 0.0–0.2)
Basos: 1 %
EOS (ABSOLUTE): 0.2 10*3/uL (ref 0.0–0.4)
Eos: 2 %
Hematocrit: 43.8 % (ref 34.0–46.6)
Hemoglobin: 14.8 g/dL (ref 11.1–15.9)
Immature Grans (Abs): 0 10*3/uL (ref 0.0–0.1)
Immature Granulocytes: 0 %
Lymphocytes Absolute: 2.9 10*3/uL (ref 0.7–3.1)
Lymphs: 25 %
MCH: 31.5 pg (ref 26.6–33.0)
MCHC: 33.8 g/dL (ref 31.5–35.7)
MCV: 93 fL (ref 79–97)
Monocytes Absolute: 0.7 10*3/uL (ref 0.1–0.9)
Monocytes: 6 %
Neutrophils Absolute: 7.5 10*3/uL — ABNORMAL HIGH (ref 1.4–7.0)
Neutrophils: 66 %
Platelets: 258 10*3/uL (ref 150–450)
RBC: 4.7 x10E6/uL (ref 3.77–5.28)
RDW: 12.7 % (ref 11.7–15.4)
WBC: 11.5 10*3/uL — ABNORMAL HIGH (ref 3.4–10.8)

## 2019-12-01 LAB — LIPID PANEL
Chol/HDL Ratio: 4.7 ratio — ABNORMAL HIGH (ref 0.0–4.4)
Cholesterol, Total: 241 mg/dL — ABNORMAL HIGH (ref 100–199)
HDL: 51 mg/dL (ref >39)
LDL Chol Calc (NIH): 177 mg/dL — ABNORMAL HIGH (ref 0–99)
Triglycerides: 75 mg/dL (ref 0–149)
VLDL Cholesterol Cal: 13 mg/dL (ref 5–40)

## 2019-12-01 LAB — TSH: TSH: 1.94 u[IU]/mL (ref 0.450–4.500)

## 2019-12-01 NOTE — Progress Notes (Signed)
Subjective:  Patient ID: Gina Hendricks, female    DOB: 1960/10/14  Age: 59 y.o. MRN: [id number]  CC: No chief complaint on file.   HPI Gina Hendricks presents for having mood swings and crying spells.  She does not even know why she is crying.  She is breaking out in sweats as well.  She says that her mom has dementia.  There is significant conflict between them.  She lashes out at the patient.  Patient is also having issues with decreased energy.  She has been out of work to help with her mom.  She is considering going back to work.  Gina Hendricks is seeing a psychiatrist and had a recent appointment with no intervention recommended at that time.  Depression screen [facility] 2/9 11/30/2019 01/10/2018 08/09/2017  Decreased Interest 1 1 0  Down, Depressed, Hopeless 1 1 0  PHQ - 2 Score 2 2 0  Altered sleeping 1 2 -  Tired, decreased energy 3 2 -  Change in appetite 1 2 -  Feeling bad or failure about yourself  1 2 -  Trouble concentrating 0 0 -  Moving slowly or fidgety/restless 0 1 -  Suicidal thoughts 0 0 -  PHQ-9 Score 8 11 -  Difficult doing work/chores Extremely dIfficult Somewhat difficult -    History Gina Hendricks has a past medical history of Anxiety, Depression, and Heart murmur.   She has a past surgical history that includes Finger surgery (Right).   Her family history includes Alcohol abuse in her brother, brother, and maternal grandfather; Arthritis in her mother; Depression in her brother; Heart disease in her father, maternal grandmother, and paternal grandfather; Vision loss in her mother.She reports that she has been smoking cigarettes. She started smoking about 39 years ago. She has been smoking about 1.00 pack per day. She has never used smokeless tobacco. She reports that she does not drink alcohol and does not use drugs.    ROS Review of Systems  Constitutional: Positive for fatigue.  HENT: Negative.   Eyes: Negative for visual disturbance.  Respiratory: Negative for  shortness of breath.   Cardiovascular: Negative for chest pain.  Gastrointestinal: Negative for abdominal pain.  Musculoskeletal: Positive for arthralgias (Right posterior hip).    Objective:  BP 112/78   Pulse 95   Temp 98.2 F (36.8 C) (Temporal)   Resp 20   Ht 5' (1.524 m)   Wt 112 lb (50.8 kg)   SpO2 97%   BMI 21.87 kg/m   BP Readings from Last 3 Encounters:  11/30/19 112/78  01/10/18 97/66  12/16/17 109/82    Wt Readings from Last 3 Encounters:  11/30/19 112 lb (50.8 kg)  01/10/18 122 lb 6.4 oz (55.5 kg)  09/25/17 127 lb (57.6 kg)     Physical Exam Constitutional:      General: She is not in acute distress.    Appearance: She is well-developed.  HENT:     Head: Normocephalic and atraumatic.     Right Ear: External ear normal.     Left Ear: External ear normal.     Nose: Nose normal.  Eyes:     Conjunctiva/sclera: Conjunctivae normal.     Pupils: Pupils are equal, round, and reactive to light.  Neck:     Thyroid: No thyromegaly.  Cardiovascular:     Rate and Rhythm: Normal rate and regular rhythm.     Heart sounds: Normal heart sounds. No murmur heard.   Pulmonary:  Effort: Pulmonary effort is normal. No respiratory distress.     Breath sounds: Normal breath sounds. No wheezing or rales.  Abdominal:     General: Bowel sounds are normal. There is no distension.     Palpations: Abdomen is soft.     Tenderness: There is no abdominal tenderness.  Musculoskeletal:        General: Tenderness (Posterior aspect of the right hip.  Near the intertrochanteric region) present.     Cervical back: Normal range of motion and neck supple.  Lymphadenopathy:     Cervical: No cervical adenopathy.  Skin:    General: Skin is warm and dry.  Neurological:     Mental Status: She is alert and oriented to person, place, and time.     Deep Tendon Reflexes: Reflexes are normal and symmetric.  Psychiatric:        Behavior: Behavior normal.        Thought Content: Thought  content normal.        Judgment: Judgment normal.    Results for orders placed or performed in visit on 11/30/19  CBC with Differential/Platelet  Result Value Ref Range   WBC 11.5 (H) 3.4 - 10.8 x10E3/uL   RBC 4.70 3.77 - 5.28 x10E6/uL   Hemoglobin 14.8 11.1 - 15.9 g/dL   Hematocrit 43.8 34.0 - 46.6 %   MCV 93 79 - 97 fL   MCH 31.5 26.6 - 33.0 pg   MCHC 33.8 31 - 35 g/dL   RDW 12.7 11.7 - 15.4 %   Platelets 258 150 - 450 x10E3/uL   Neutrophils 66 Not Estab. %   Lymphs 25 Not Estab. %   Monocytes 6 Not Estab. %   Eos 2 Not Estab. %   Basos 1 Not Estab. %   Neutrophils Absolute 7.5 (H) 1 - 7 x10E3/uL   Lymphocytes Absolute 2.9 0 - 3 x10E3/uL   Monocytes Absolute 0.7 0 - 0 x10E3/uL   EOS (ABSOLUTE) 0.2 0.0 - 0.4 x10E3/uL   Basophils Absolute 0.1 0 - 0 x10E3/uL   Immature Granulocytes 0 Not Estab. %   Immature Grans (Abs) 0.0 0.0 - 0.1 x10E3/uL  CMP14+EGFR  Result Value Ref Range   Glucose 95 65 - 99 mg/dL   BUN 9 6 - 24 mg/dL   Creatinine, Ser 0.78 0.57 - 1.00 mg/dL   GFR calc non Af Amer 85 >59 mL/min/1.73   GFR calc Af Amer 98 >59 mL/min/1.73   BUN/Creatinine Ratio 12 9 - 23   Sodium 145 (H) 134 - 144 mmol/L   Potassium 4.3 3.5 - 5.2 mmol/L   Chloride 105 96 - 106 mmol/L   CO2 26 20 - 29 mmol/L   Calcium 10.1 8.7 - 10.2 mg/dL   Total Protein 7.1 6.0 - 8.5 g/dL   Albumin 4.5 3.8 - 4.9 g/dL   Globulin, Total 2.6 1.5 - 4.5 g/dL   Albumin/Globulin Ratio 1.7 1.2 - 2.2   Bilirubin Total 0.3 0.0 - 1.2 mg/dL   Alkaline Phosphatase 124 (H) 48 - 121 IU/L   AST 18 0 - 40 IU/L   ALT 12 0 - 32 IU/L  Lipid panel  Result Value Ref Range   Cholesterol, Total 241 (H) 100 - 199 mg/dL   Triglycerides 75 0 - 149 mg/dL   HDL 51 >39 mg/dL   VLDL Cholesterol Cal 13 5 - 40 mg/dL   LDL Chol Calc (NIH) 177 (H) 0 - 99 mg/dL   Chol/HDL Ratio 4.7 (H) 0.0 -  4.4 ratio  TSH  Result Value Ref Range   TSH 1.940 0.450 - 4.500 uIU/mL  FSH/LH  Result Value Ref Range   LH 53.3 mIU/mL   FSH  109.0 mIU/mL      Assessment & Plan:   Diagnoses and all orders for this visit:  Fatigue, unspecified type -     CBC with Differential/Platelet -     CMP14+EGFR -     Lipid panel -     TSH -     FSH/LH  Chronic night sweats -     CBC with Differential/Platelet -     CMP14+EGFR -     Lipid panel -     TSH -     FSH/LH  Mood swings -     CBC with Differential/Platelet -     CMP14+EGFR -     Lipid panel -     TSH -     FSH/LH  Right hip pain -     predniSONE (DELTASONE) 10 MG tablet; Take 5 daily for 3 days followed by 4,3,2 and 1 for 3 days each. -     DG HIP UNILAT W OR W/O PELVIS 2-3 VIEWS RIGHT; Future  Depression, major, single episode, moderate (HCC)  Trochanteric bursitis of right hip -     predniSONE (DELTASONE) 10 MG tablet; Take 5 daily for 3 days followed by 4,3,2 and 1 for 3 days each.       I am having Gina Hendricks start on predniSONE. I am also having her maintain her alprazolam, PARoxetine, rosuvastatin, Vitamin D (Ergocalciferol), albuterol, valACYclovir, and cholecalciferol.  Allergies as of 11/30/2019      Reactions   Zoloft [sertraline Hcl] Other (See Comments)   "out of body experience"      Medication List       Accurate as of November 30, 2019 11:59 PM. If you have any questions, ask your nurse or doctor.        albuterol 108 (90 Base) MCG/ACT inhaler Commonly known as: VENTOLIN HFA Inhale 2 puffs into the lungs every 6 (six) hours as needed for wheezing or shortness of breath.   alprazolam 2 MG tablet Commonly known as: XANAX Take 2 mg by mouth 3 (three) times daily.   cholecalciferol 25 MCG (1000 UNIT) tablet Commonly known as: VITAMIN D3 Take 1,000 Units by mouth daily.   PARoxetine 20 MG tablet Commonly known as: PAXIL Take 40 mg by mouth daily.   predniSONE 10 MG tablet Commonly known as: DELTASONE Take 5 daily for 3 days followed by 4,3,2 and 1 for 3 days each. Started by: Claretta Fraise, MD   rosuvastatin 20 MG  tablet Commonly known as: CRESTOR Take 20 mg by mouth daily.   valACYclovir 500 MG tablet Commonly known as: VALTREX TAKE 1 TABLET BY MOUTH TWICE A DAY   Vitamin D (Ergocalciferol) 1.25 MG (50000 UNIT) Caps capsule Commonly known as: DRISDOL Take 50,000 Units by mouth once a week.      Patient is currently being followed by psychiatry for her issues with depression anxiety.  Significant prescriptions have been written.  Therefore I declined to intervene in that area today.  She needs to see her psychiatrist.  I did offer support regarding her relationship between her and her mother.  Follow-up: Return in about 6 weeks (around 01/11/2020).  Claretta Fraise, M.D.

## 2020-02-04 ENCOUNTER — Emergency Department (HOSPITAL_COMMUNITY)
Admission: EM | Admit: 2020-02-04 | Discharge: 2020-02-07 | Disposition: A | Discharge status: Home / Self Care | Payer: 59 | Attending: Emergency Medicine | Admitting: Emergency Medicine

## 2020-02-04 ENCOUNTER — Encounter (HOSPITAL_COMMUNITY): Payer: Self-pay

## 2020-02-04 DIAGNOSIS — F13232 Sedative, hypnotic or anxiolytic dependence with withdrawal with perceptual disturbance: Secondary | ICD-10-CM | POA: Insufficient documentation

## 2020-02-04 DIAGNOSIS — F17210 Nicotine dependence, cigarettes, uncomplicated: Secondary | ICD-10-CM | POA: Diagnosis not present

## 2020-02-04 DIAGNOSIS — Z79899 Other long term (current) drug therapy: Secondary | ICD-10-CM | POA: Diagnosis not present

## 2020-02-04 DIAGNOSIS — F1320 Sedative, hypnotic or anxiolytic dependence, uncomplicated: Secondary | ICD-10-CM | POA: Insufficient documentation

## 2020-02-04 DIAGNOSIS — F1994 Other psychoactive substance use, unspecified with psychoactive substance-induced mood disorder: Secondary | ICD-10-CM

## 2020-02-04 DIAGNOSIS — F332 Major depressive disorder, recurrent severe without psychotic features: Secondary | ICD-10-CM | POA: Insufficient documentation

## 2020-02-04 DIAGNOSIS — R45851 Suicidal ideations: Secondary | ICD-10-CM | POA: Insufficient documentation

## 2020-02-04 DIAGNOSIS — Z20822 Contact with and (suspected) exposure to covid-19: Secondary | ICD-10-CM | POA: Insufficient documentation

## 2020-02-04 DIAGNOSIS — F13932 Sedative, hypnotic or anxiolytic use, unspecified with withdrawal with perceptual disturbances: Secondary | ICD-10-CM

## 2020-02-04 DIAGNOSIS — R410 Disorientation, unspecified: Secondary | ICD-10-CM | POA: Diagnosis present

## 2020-02-04 LAB — CBC WITH DIFFERENTIAL/PLATELET
Abs Immature Granulocytes: 0.03 10*3/uL (ref 0.00–0.07)
Basophils Absolute: 0 10*3/uL (ref 0.0–0.1)
Basophils Relative: 1 %
Eosinophils Absolute: 0.1 10*3/uL (ref 0.0–0.5)
Eosinophils Relative: 1 %
HCT: 41.1 % (ref 36.0–46.0)
Hemoglobin: 13.8 g/dL (ref 12.0–15.0)
Immature Granulocytes: 0 %
Lymphocytes Relative: 26 %
Lymphs Abs: 2.2 10*3/uL (ref 0.7–4.0)
MCH: 32.2 pg (ref 26.0–34.0)
MCHC: 33.6 g/dL (ref 30.0–36.0)
MCV: 96 fL (ref 80.0–100.0)
Monocytes Absolute: 0.5 10*3/uL (ref 0.1–1.0)
Monocytes Relative: 6 %
Neutro Abs: 5.7 10*3/uL (ref 1.7–7.7)
Neutrophils Relative %: 66 %
Platelets: 219 10*3/uL (ref 150–400)
RBC: 4.28 MIL/uL (ref 3.87–5.11)
RDW: 14.2 % (ref 11.5–15.5)
WBC: 8.5 10*3/uL (ref 4.0–10.5)
nRBC: 0 % (ref 0.0–0.2)

## 2020-02-04 LAB — COMPREHENSIVE METABOLIC PANEL
ALT: 14 U/L (ref 0–44)
AST: 16 U/L (ref 15–41)
Albumin: 3.9 g/dL (ref 3.5–5.0)
Alkaline Phosphatase: 75 U/L (ref 38–126)
Anion gap: 10 (ref 5–15)
BUN: 9 mg/dL (ref 6–20)
CO2: 23 mmol/L (ref 22–32)
Calcium: 9.5 mg/dL (ref 8.9–10.3)
Chloride: 108 mmol/L (ref 98–111)
Creatinine, Ser: 0.68 mg/dL (ref 0.44–1.00)
GFR calc Af Amer: >60 mL/min (ref >60)
GFR calc non Af Amer: >60 mL/min (ref >60)
Glucose, Bld: 90 mg/dL (ref 70–99)
Potassium: 3.8 mmol/L (ref 3.5–5.1)
Sodium: 141 mmol/L (ref 135–145)
Total Bilirubin: 0.5 mg/dL (ref 0.3–1.2)
Total Protein: 6.8 g/dL (ref 6.5–8.1)

## 2020-02-04 LAB — RAPID URINE DRUG SCREEN, HOSP PERFORMED
Amphetamines: NOT DETECTED
Barbiturates: NOT DETECTED
Benzodiazepines: POSITIVE — AB
Cocaine: NOT DETECTED
Opiates: NOT DETECTED
Tetrahydrocannabinol: NOT DETECTED

## 2020-02-04 LAB — POC URINE PREG, ED: Preg Test, Ur: NEGATIVE

## 2020-02-04 LAB — ACETAMINOPHEN LEVEL: Acetaminophen (Tylenol), Serum: <10 ug/mL — ABNORMAL LOW (ref 10–30)

## 2020-02-04 LAB — SALICYLATE LEVEL: Salicylate Lvl: <7 mg/dL — ABNORMAL LOW (ref 7.0–30.0)

## 2020-02-04 LAB — ETHANOL: Alcohol, Ethyl (B): <10 mg/dL (ref <10)

## 2020-02-04 LAB — SARS CORONAVIRUS 2 BY RT PCR (HOSPITAL ORDER, PERFORMED IN ~~LOC~~ HOSPITAL LAB): SARS Coronavirus 2: NEGATIVE

## 2020-02-04 MED ORDER — ZIPRASIDONE MESYLATE 20 MG IM SOLR: 20.0000 mg | INTRAMUSCULAR | Status: DC | PRN

## 2020-02-04 MED ORDER — PAROXETINE HCL 20 MG PO TABS
40.0000 mg | ORAL_TABLET | Freq: Every day | ORAL | Status: DC
  Administered 2020-02-05 – 2020-02-07 (×3): 40 mg via ORAL
  Filled 2020-02-04 (×7): qty 2

## 2020-02-04 MED ORDER — ZOLPIDEM TARTRATE 5 MG PO TABS
5.0000 mg | ORAL_TABLET | Freq: Every evening | ORAL | Status: DC | PRN
  Administered 2020-02-04 – 2020-02-05 (×2): 5 mg via ORAL
  Filled 2020-02-04 (×2): qty 1

## 2020-02-04 MED ORDER — ONDANSETRON HCL 4 MG PO TABS: 4.0000 mg | ORAL_TABLET | Freq: Three times a day (TID) | ORAL | Status: DC | PRN

## 2020-02-04 MED ORDER — LORAZEPAM 2 MG/ML IJ SOLN
INTRAMUSCULAR | Status: AC
  Administered 2020-02-04: 2 mg via INTRAMUSCULAR
  Filled 2020-02-04: qty 1

## 2020-02-04 MED ORDER — LORAZEPAM 1 MG PO TABS
1.0000 mg | ORAL_TABLET | ORAL | Status: AC | PRN
  Administered 2020-02-04: 1 mg via ORAL
  Filled 2020-02-04: qty 1

## 2020-02-04 MED ORDER — NICOTINE 21 MG/24HR TD PT24
21.0000 mg | MEDICATED_PATCH | Freq: Every day | TRANSDERMAL | Status: DC
  Administered 2020-02-04 – 2020-02-07 (×4): 21 mg via TRANSDERMAL
  Filled 2020-02-04 (×4): qty 1

## 2020-02-04 MED ORDER — ACETAMINOPHEN 325 MG PO TABS
650.0000 mg | ORAL_TABLET | ORAL | Status: DC | PRN
  Administered 2020-02-05: 650 mg via ORAL
  Filled 2020-02-04: qty 2

## 2020-02-04 MED ORDER — RISPERIDONE 1 MG PO TBDP
2.0000 mg | ORAL_TABLET | Freq: Three times a day (TID) | ORAL | Status: DC | PRN
  Administered 2020-02-04 – 2020-02-05 (×2): 2 mg via ORAL
  Filled 2020-02-04 (×2): qty 2

## 2020-02-04 MED ORDER — ALUM & MAG HYDROXIDE-SIMETH 200-200-20 MG/5ML PO SUSP: 30.0000 mL | Freq: Four times a day (QID) | ORAL | Status: DC | PRN

## 2020-02-04 MED ORDER — LORAZEPAM 2 MG/ML IJ SOLN: 2.0000 mg | Freq: Once | INTRAMUSCULAR | Status: AC

## 2020-02-04 NOTE — ED Notes (Signed)
Pt calm and cooperative at this time. Bilateral leg restraints discontinued at time. [facility] signed off on at this time.

## 2020-02-04 NOTE — ED Notes (Signed)
Pt became teary and stated that "I have been crazy for 40 years". When asked what makes her think that, pt stated "I am depressed". Attempted to calm down pt. Sitter Deanna called RN over after pt stated "I want to kill myself.". Pt is being monitored

## 2020-02-04 NOTE — BH Assessment (Signed)
Comprehensive Clinical Assessment (CCA) Note  02/04/2020 JENNAE HAKEEM [id number]   Patient presented to [facility] via EMS suicidal, but no plan.  However, patient was agitated and making statements about wanting to kill herself.  Patient has a long history of depression and anxiety and she is treated my Rupinder Evelene Croon. Patient has been prescribed Xanax for the past thirty-five years and has most recently been taking approximately six milligrams daily.  Patient states that she would like to detox off the Xanax because she is not able to take care of her mother like she needs to.  Patient states that she has been trying to taper herself down off Xanax, however, she is currently out of her medicine and the prescription cannot be filled until 02/10/2020.  Patient is starting to go into some withdrawal and is fearful because she states that she has a hitory of seizures coming off the Xanax.  Patient states that she really had no plan of how she would kill herself, but earlier in the ED, patient tried to choke herself out, gouge out the eyes of EMS workers and she stabbed herself with a pen. Patient had to be restrained.  She is now cooperative and no longer in restrains.  Patient states, "I was just really upset."  Patient denies HI/Psychosis.  She states that she does not use illicit drugs or alcohol.  Patient states that she has not been sleeping well and she states that she has not been eating well and she has lost 23 pounds.  Patient denies a history of self-mutilation and abuse.  Patient states that she has never been married and she states that she has no children.  Patient resides with her mother.  She states that she has been unemployed for almost a year.  She worked at [employer] for the past three years.  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  Patient presents as alert and oriented.  Her mood is depressed and she is moderately anxious.  Her judgment, insight and impulse control are impaired.  Her  thoughts are organized and her memory intact.  She does not appear to be responding to any internal stimuli.  Her eye contact is good and her speech coherent.  Visit Diagnosis:      ICD-10-CM   1. Suicidal ideation  R45.851   2. Benzodiazepine withdrawal with perceptual disturbance (HCC)  F13.232   3.      MDD Recurrent Severe without Psychosis   F33.2    CCA Screening, Triage and Referral (STR)  Patient Reported Information How did you hear about [place]? Self  Referral name: No data recorded Referral phone number: No data recorded  Whom do you see for routine medical problems? Primary Care  Practice/Facility Name: [facility]  Practice/Facility Phone Number: No data recorded Name of Contact: No data recorded Contact Number: No data recorded Contact Fax Number: No data recorded Prescriber Name: No data recorded Prescriber Address (if known): No data recorded  What Is the Reason for Your Visit/Call Today? Patient is detoxing from Xanax and is suicidal  How Long Has This Been Causing You Problems? > than 6 months  What Do You Feel Would Help You the Most Today? Other (Comment) (Inpatient Hospitalization)   Have You Recently Been in Any Inpatient Treatment (Hospital/Detox/Crisis Center/28-Day Program)? No  Name/Location of Program/Hospital:No data recorded How Long Were You There? No data recorded When Were You Discharged? No data recorded  Have You Ever Received Services From [facility] Before? Yes  [facility]? patient has been seen in the Emergency Departments   Have You Recently Had Any Thoughts About Hurting Yourself? Yes  Are You Planning to Commit Suicide/Harm Yourself At This time? No (patient had suicidal thoughts, but no plan of how to kill self)   Have you Recently Had Thoughts About Hurting Someone Karolee Ohslse? No  Explanation: No data recorded  Have You Used Any Alcohol or Drugs in the Past 24 Hours? No  How Long Ago Did You Use  Drugs or Alcohol? No data recorded What Did You Use and How Much? No data recorded  Do You Currently Have a Therapist/Psychiatrist? Yes  Name of Therapist/Psychiatrist: Rupinder Evelene CroonKaur   Have You Been Recently Discharged From Any Office Practice or Programs? No  Explanation of Discharge From Practice/Program: No data recorded    CCA Screening Triage Referral Assessment Type of Contact: Tele-Assessment  Is this Initial or Reassessment? Initial Assessment  Date Telepsych consult ordered in [facility]:  02/04/20  [facility]   Patient Reported Information Reviewed? Yes  Patient Left Without Being Seen? No data recorded Reason for Not Completing Assessment: No data recorded  Collateral Involvement: no collateral information available   Does Patient Have a Court Appointed Legal Guardian? No data recorded Name and Contact of Legal Guardian: No data recorded If Minor and Not Living with Parent(s), Who has Custody? No data recorded Is CPS involved or ever been involved? Never  Is APS involved or ever been involved? Never   Patient Determined To Be At Risk for Harm To Self or Others Based on Review of Patient Reported Information or Presenting Complaint? No  Method: No data recorded Availability of Means: No data recorded Intent: No data recorded Notification Required: No data recorded Additional Information for Danger to Others Potential: No data recorded Additional Comments for Danger to Others Potential: No data recorded Are There Guns or Other Weapons in Your Home? No data recorded Types of Guns/Weapons: No data recorded Are These Weapons Safely Secured?                            No data recorded Who Could Verify You Are Able To Have These Secured: No data recorded Do You Have any Outstanding Charges, Pending Court Dates, Parole/Probation? No data recorded Contacted To Inform of Risk of Harm To Self or Others: No data recorded  Location of  Assessment: [facility]   Does Patient Present under Involuntary Commitment? No  IVC Papers Initial File Date: No data recorded  [place]   Patient Currently Receiving the Following Services: Medication Management   Determination of Need: Emergent (2 hours)   Options For Referral: Inpatient Hospitalization;Other: Comment (possible medical admission for detox)     CCA Biopsychosocial  Intake/Chief Complaint:  CCA Intake With Chief Complaint CCA Part Two Date: 02/04/20 CCA Part Two Time: 1812 Chief Complaint/Presenting Problem: Patient has run out of Xanax and was upset and suicidal today, no plan.  Patient states that she was upset because she cannot do for her aged mother what she wishes that she could do for her. Patient's Currently Reported Symptoms/Problems: Patient's anxiety is high, she is depressed and irritable Individual's Strengths: Patient states that she is unable to identify any strengths Individual's Preferences: Patient states that she has no preferences that require accommodation Individual's Abilities: Patient states that she has no particular abilities  Type of Services Patient Feels Are Needed: Patient feels like she needs to be hospitalized Initial Clinical Notes/Concerns: Patient is at high risk for withdrawal seizures  Mental Health Symptoms Depression:  Depression: Change in energy/activity, Difficulty Concentrating, Irritability, Duration of symptoms less than two weeks  Mania:  Mania: None  Anxiety:   Anxiety: Restlessness, Worrying, Tension  Psychosis:  Psychosis: None  Trauma:  Trauma: None  Obsessions:  Obsessions: None  Compulsions:  Compulsions: None  Inattention:  Inattention: None  Hyperactivity/Impulsivity:  Hyperactivity/Impulsivity: N/A  Oppositional/Defiant Behaviors:  Oppositional/Defiant Behaviors: None  Emotional Irregularity:  Emotional Irregularity: Mood lability, Potentially harmful impulsivity  Other  Mood/Personality Symptoms:      Mental Status Exam Appearance and self-care  Stature:  Stature: Small  Weight:  Weight: Average weight  Clothing:  Clothing: Neat/clean, Casual  Grooming:  Grooming: Normal  Cosmetic use:  Cosmetic Use: Age appropriate  Posture/gait:  Posture/Gait: Normal  Motor activity:  Motor Activity: Agitated, Restless  Sensorium  Attention:  Attention: Normal  Concentration:  Concentration: Anxiety interferes  Orientation:  Orientation: Object, Person, Place, Situation, Time  Recall/memory:  Recall/Memory: Normal  Affect and Mood  Affect:  Affect: Anxious, Depressed, Flat  Mood:  Mood: Depressed, Anxious, Worthless, Irritable  Relating  Eye contact:  Eye Contact: Normal  Facial expression:  Facial Expression: Depressed, Anxious  Attitude toward examiner:  Attitude Toward Examiner: Cooperative  Thought and Language  Speech flow: Speech Flow: Clear and Coherent  Thought content:  Thought Content: Appropriate to Mood and Circumstances  Preoccupation:  Preoccupations: None  Hallucinations:  Hallucinations: None  Organization:     Company secretary of Knowledge:  Fund of Knowledge: Good  Intelligence:  Intelligence: Average  Abstraction:  Abstraction: Normal  Judgement:  Judgement: Impaired  Reality Testing:  Reality Testing: Realistic  Insight:  Insight: Poor  Decision Making:  Decision Making: Impulsive  Social Functioning  Social Maturity:  Social Maturity: Responsible  Social Judgement:  Social Judgement: Normal  Stress  Stressors:  Stressors: Illness (mother is older and she needs patient's help)  Coping Ability:  Coping Ability: Horticulturist, commercial Deficits:  Skill Deficits: Self-control  Supports:  Supports: Family     Religion: Religion/Spirituality Are You A Religious Person?: Yes What is Your Religious Affiliation?: Christian How Might This Affect Treatment?: N/A  Leisure/Recreation: Leisure / Recreation Do You Have Hobbies?:  No  Exercise/Diet: Exercise/Diet Do You Exercise?: No Have You Gained or Lost A Significant Amount of Weight in the Past Six Months?: Yes-Lost Number of Pounds Lost?: 23 Do You Follow a Special Diet?: No Do You Have Any Trouble Sleeping?: No   CCA Employment/Education  Employment/Work Situation: Employment / Work Psychologist, occupational Employment situation: Unemployed Patient's job has been impacted by current illness: No What is the longest time patient has a held a job?: Patient states thats she last worked at [employer] for a little over 3 years Has patient ever been in the [employer]?: No  Education: Education Is Patient Currently Attending School?: No Last Grade Completed: 12 Name of High School: [employer] Did Garment/textile technologist From [employer]?: Yes Did Theme park manager?: Yes What Type of College Degree Do you Have?: Associates Degree in [employer] Did You Attend Graduate School?: No Did You Have An Individualized Education Program (IIEP): No Did You Have Any Difficulty At [employer]?: No Patient's Education Has Been Impacted by Current Illness: No   CCA Family/Childhood History  Family and Relationship History: Family history Marital status: Single Are you sexually active?:  No What is your sexual orientation?: not assessed Has your sexual activity been affected by drugs, alcohol, medication, or emotional stress?: N/A  Childhood History:  Childhood History By whom was/is the patient raised?: Both parents Description of patient's relationship with caregiver when they were a child: Patient states that she grew up in a great home with good parents that she states that she was very close to. Patient's description of current relationship with people who raised him/her: Patient states that she is still close to her mother, her father is deceased How were you disciplined when you got in trouble as a child/adolescent?: Patient states that she was disciplined appropriately Does patient have  siblings?: Yes Number of Siblings: 3 Description of patient's current relationship with siblings: Patient states that she is only close to two of her siblings Did patient suffer any verbal/emotional/physical/sexual abuse as a child?: No Did patient suffer from severe childhood neglect?: No Has patient ever been sexually abused/assaulted/raped as an adolescent or adult?: No Was the patient ever a victim of a crime or a disaster?: No Witnessed domestic violence?: No Has patient been affected by domestic violence as an adult?: No  Child/Adolescent Assessment:     CCA Substance Use  Alcohol/Drug Use:                           ASAM's:  Six Dimensions of Multidimensional Assessment  Dimension 1:  Acute Intoxication and/or Withdrawal Potential:      Dimension 2:  Biomedical Conditions and Complications:      Dimension 3:  Emotional, Behavioral, or Cognitive Conditions and Complications:     Dimension 4:  Readiness to Change:     Dimension 5:  Relapse, Continued use, or Continued Problem Potential:     Dimension 6:  Recovery/Living Environment:     ASAM Severity Score:    ASAM Recommended Level of Treatment:     Substance use Disorder (SUD)    Recommendations for Services/Supports/Treatments:    DSM5 Diagnoses: Patient Active Problem List   Diagnosis Date Noted  . Severe episode of recurrent major depressive disorder, without psychotic features (HCC)   . Sedative dependence (HCC)   . Depression, major, single episode, moderate (HCC) 12/01/2019  . Right hip pain 12/01/2019    Disposition:  Per Malachy Chamber, DNP, patient is appropriate for an inpatient admission   Referrals to Alternative Service(s): Referred to Alternative Service(s):   Place:   Date:   Time:    Referred to Alternative Service(s):   Place:   Date:   Time:    Referred to Alternative Service(s):   Place:   Date:   Time:    Referred to Alternative Service(s):   Place:   Date:   Time:     Arnoldo Lenis Chelisa Hennen

## 2020-02-04 NOTE — ED Notes (Signed)
Sheriff's Deputy states pt had 2 guns at house when law enforcement arrived.

## 2020-02-04 NOTE — ED Notes (Signed)
Pt began trying to choke herself and soft restraints were ordered.

## 2020-02-04 NOTE — ED Notes (Signed)
TTS in progress 

## 2020-02-04 NOTE — ED Notes (Signed)
Pt assisted to BR. Hands on pt very unsteady

## 2020-02-04 NOTE — ED Notes (Addendum)
Pt becoming agitated. Crying and states "give me something to put me out forever". Risperidone 2mg  p.o given to pt. Will follow up.

## 2020-02-04 NOTE — ED Notes (Signed)
1932:Pt awake sitting up in bed looking around the room. Asked if pt needed anything ans she just looked aat me but no verbal response.  1940: pt laid down, proceeded to try and remove seizure pad. When told to stop patient rolled to her stomach and proceeded to try and pull IV poll up from back of bed. This tech had to go in and remove pt hands from poll and have her roll back over on the mattress.  Pt now resting on side in bed. ** IV poll not removable  RN Shanda Bumps made aware

## 2020-02-04 NOTE — ED Provider Notes (Signed)
[facility] EMERGENCY DEPARTMENT Provider Note   CSN: [id number] Arrival date & time: 02/04/20  1314     History Chief Complaint  Patient presents with  . Delirium Tremens (DTS)  . V70.1    Gina Hendricks is a 59 y.o. female.  The history is provided by the patient, the EMS personnel and medical records. No language interpreter was used.     59 year old female significant history of anxiety, chronic Xanax use, prior alcohol abuse but none recently presenting for evaluation Xanax withdrawal.  Per EMS note, patient's family, history of department and EMS due to patient's behavior.  Patient refused to keep her clothes on, she was making comments about killing herself.  Per report the patient ran out of her Xanax and was splitting them.  It was noted that patient attempt to grab a pen from an EMS worker and attempt to gouge her eyes.  History is limited due to patient's current state of mind.  Patient continues saying I want to die, die.  She has had history of Xanax withdrawals in the past.  She does complain of aches and pains. She report hallucination. The remainder of the history is limited.  Past Medical History:  Diagnosis Date  . Anxiety   . Depression   . Heart murmur     Patient Active Problem List   Diagnosis Date Noted  . Depression, major, single episode, moderate (HCC) 12/01/2019  . Right hip pain 12/01/2019    Past Surgical History:  Procedure Laterality Date  . FINGER SURGERY Right    right index finger     OB History    Gravida      Para      Term      Preterm      AB      Living  0     SAB      TAB      Ectopic      Multiple      Live Births              Family History  Problem Relation Age of Onset  . Arthritis Mother   . Vision loss Mother   . Heart disease Father   . Alcohol abuse Brother   . Depression Brother   . Heart disease Maternal Grandmother   . Alcohol abuse Maternal Grandfather   . Heart disease Paternal  Grandfather   . Alcohol abuse Brother     Social History   Tobacco Use  . Smoking status: Current Every Day Smoker    Packs/day: 1.00    Types: Cigarettes    Start date: 08/24/1980  . Smokeless tobacco: Never Used  Vaping Use  . Vaping Use: Never used  Substance Use Topics  . Alcohol use: No  . Drug use: No    Home Medications Prior to Admission medications   Medication Sig Start Date End Date Taking? Authorizing Provider  albuterol (PROVENTIL HFA;VENTOLIN HFA) 108 (90 Base) MCG/ACT inhaler Inhale 2 puffs into the lungs every 6 (six) hours as needed for wheezing or shortness of breath. Patient not taking: Reported on 11/30/2019 01/10/18   Johna Sheriff, MD  alprazolam Prudy Feeler) 2 MG tablet Take 2 mg by mouth 3 (three) times daily. 07/30/16   [provider]  cholecalciferol (VITAMIN D3) 25 MCG (1000 UNIT) tablet Take 1,000 Units by mouth daily.    [provider]  PARoxetine (PAXIL) 20 MG tablet Take 40 mg by mouth daily. 07/28/16  [provider]  predniSONE (DELTASONE) 10 MG tablet Take 5 daily for 3 days followed by 4,3,2 and 1 for 3 days each. 11/30/19   Mechele Claude, MD  rosuvastatin (CRESTOR) 20 MG tablet Take 20 mg by mouth daily. Patient not taking: Reported on 11/30/2019 10/13/17   [provider]  valACYclovir (VALTREX) 500 MG tablet TAKE 1 TABLET BY MOUTH TWICE A DAY Patient not taking: Reported on 11/30/2019 02/03/19   Mechele Claude, MD  Vitamin D, Ergocalciferol, (DRISDOL) 50000 units CAPS capsule Take 50,000 Units by mouth once a week. Patient not taking: Reported on 11/30/2019 08/26/17   [provider]    Allergies    Zoloft [sertraline hcl]  Review of Systems   Review of Systems  Unable to perform ROS: Mental status change    Physical Exam Updated Vital Signs BP 111/69 (BP Location: Right Arm)   Pulse 71   Temp 98.6 F (37 C) (Oral)   Resp 17   SpO2 97%   Physical Exam Vitals and nursing note reviewed.   Constitutional:      Appearance: She is well-developed.     Comments: Patient is restrained to the bed.  She is crying, yelling, saying "die, die"   HENT:     Head: Atraumatic.     Mouth/Throat:     Mouth: Mucous membranes are dry.  Eyes:     Extraocular Movements: Extraocular movements intact.     Conjunctiva/sclera: Conjunctivae normal.     Pupils: Pupils are equal, round, and reactive to light.  Cardiovascular:     Rate and Rhythm: Normal rate and regular rhythm.     Pulses: Normal pulses.     Heart sounds: Normal heart sounds.  Pulmonary:     Effort: Pulmonary effort is normal.     Breath sounds: Normal breath sounds.  Abdominal:     Palpations: Abdomen is soft.  Musculoskeletal:     Cervical back: Normal range of motion and neck supple.  Skin:    Findings: No rash.  Neurological:     GCS: GCS eye subscore is 4. GCS verbal subscore is 5. GCS motor subscore is 6.  Psychiatric:        Mood and Affect: Mood is anxious. Affect is angry and tearful.        Speech: She is noncommunicative.        Behavior: Behavior is uncooperative and agitated.        Thought Content: Thought content includes homicidal and suicidal ideation.     ED Results / Procedures / Treatments   Labs (all labs ordered are listed, but only abnormal results are displayed) Labs Reviewed  ACETAMINOPHEN LEVEL - Abnormal; Notable for the following components:      Result Value   Acetaminophen (Tylenol), Serum <10 (*)    All other components within normal limits  SALICYLATE LEVEL - Abnormal; Notable for the following components:   Salicylate Lvl <7.0 (*)    All other components within normal limits  RAPID URINE DRUG SCREEN, HOSP PERFORMED - Abnormal; Notable for the following components:   Benzodiazepines POSITIVE (*)    All other components within normal limits  SARS CORONAVIRUS 2 BY RT PCR (HOSPITAL ORDER, PERFORMED IN [place] [facility])  COMPREHENSIVE METABOLIC PANEL  ETHANOL  CBC WITH  DIFFERENTIAL/PLATELET  POC URINE PREG, ED    EKG None  Radiology No results found.  Procedures Procedures (including critical care time)  Medications Ordered in ED Medications  risperiDONE (RISPERDAL M-TABS) disintegrating  tablet 2 mg (2 mg Oral Given 02/04/20 1843)    And  LORazepam (ATIVAN) tablet 1 mg (1 mg Oral Given 02/04/20 2010)    And  ziprasidone (GEODON) injection 20 mg (has no administration in time range)  acetaminophen (TYLENOL) tablet 650 mg (has no administration in time range)  zolpidem (AMBIEN) tablet 5 mg (5 mg Oral Given 02/04/20 2010)  ondansetron (ZOFRAN) tablet 4 mg (has no administration in time range)  alum & mag hydroxide-simeth (MAALOX/MYLANTA) 200-200-20 MG/5ML suspension 30 mL (has no administration in time range)  nicotine (NICODERM CQ - dosed in mg/24 hours) patch 21 mg (21 mg Transdermal Patch Applied 02/04/20 1715)  PARoxetine (PAXIL) tablet 40 mg (40 mg Oral Not Given 02/04/20 1715)  LORazepam (ATIVAN) injection 2 mg (2 mg Intramuscular Given 02/04/20 1421)    ED Course  I have reviewed the triage vital signs and the nursing notes.  Pertinent labs & imaging results that were available during my care of the patient were reviewed by me and considered in my medical decision making (see chart for details).    MDM Rules/Calculators/A&P                          BP 111/69 (BP Location: Right Arm)   Pulse 71   Temp 98.6 F (37 C) (Oral)   Resp 17   SpO2 97%   Final Clinical Impression(s) / ED Diagnoses Final diagnoses:  Suicidal ideation  Benzodiazepine withdrawal with perceptual disturbance (HCC)    Rx / DC Orders ED Discharge Orders    None     2:36 PM Patient with significant history of depression as well as regular use of Xanax who is here with apparent suicidal ideation and psychosis.  She is not cooperative, screaming, yelling, tearful with suicidal ideation.  Per family's note, patient has been taking less and less of her Xanax.  There is  concern for potential drug withdrawals.  Work-up initiated.  3:59 PM UDS shows positive for benzodiazepine.  Screening Covid test is negative.  The remainder of her labs are reassuring.  At this time patient is medically cleared and can be further evaluated by TTS for her psychiatric illness.  IVC form and first exam form filled.  AME HEAGLE was evaluated in Emergency Department on 02/04/2020 for the symptoms described in the history of present illness. She was evaluated in the context of the global COVID-19 pandemic, which necessitated consideration that the patient might be at risk for infection with the SARS-CoV-2 virus that causes COVID-19. Institutional protocols and algorithms that pertain to the evaluation of patients at risk for COVID-19 are in a state of rapid change based on information released by regulatory bodies including the CDC and federal and state organizations. These policies and algorithms were followed during the patient's care in the ED.    Fayrene Helper, PA-C 02/04/20 2208    Bethann Berkshire, MD 02/06/20 [phone]

## 2020-02-04 NOTE — ED Notes (Signed)
Pt cooperative and ambulating to bathroom. All restraints removed at this time.

## 2020-02-04 NOTE — ED Triage Notes (Signed)
Pts family called sheriffs dept and thenEMS notified due to pts behavior. . Pt will not keep clothes on in dept. . Making comments of killing herself. Family reports that she has ran out of her xanax and was splitting them. No refill until the 11th. Patient was making statements I need to kill myself, let me die. Pt attempted to grab pen from EMS worker and atttempting to [employer]

## 2020-02-05 NOTE — ED Notes (Signed)
Offered pt water and BR. Pt not needed at this time

## 2020-02-05 NOTE — BH Assessment (Addendum)
Per Denzil Magnuson, NP given the safety concerns on arrival, patient continues to meet inpatient criteria. SW to continue to pursue inpatient treatment options. No appropriate beds available at [facility]. Patient referred to the following facilities for bed placement:  [facility]  [facility]  [facility]  [facility]  [facility]  [facility]  [facility]  [facility]  [facility]  [facility]  [facility]  [facility]

## 2020-02-05 NOTE — ED Notes (Signed)
Pt walked to the bathroom. Pt compliant during the entire time. Pt is now back on her bed resting. Will continue to monitor pt

## 2020-02-05 NOTE — ED Notes (Signed)
Pt's dinner has arrived, Pt sitting up and eating her dinner. Will continue to monitor pt.

## 2020-02-05 NOTE — ED Notes (Signed)
Pt has been cooperative and calm. Currently watching tv. Will continue to be monitored.

## 2020-02-05 NOTE — ED Notes (Signed)
Pt.s breakfast has arrived. Pt only wanted here chicken broth.

## 2020-02-05 NOTE — ED Notes (Signed)
Pt became very teary asking for her mother and that she was weak. Pt stated "My mama is strong, and I can't take care of her. I am useless". Told pt that "we shouldn't think that way. You are a strong person for fighting with depression". Pt began to cry. Comforted pt, pt now laying down on bed with blankets over her. Pt complained of being unhappy and having a headache. RN Daphne notified. Will continue to monitor pt.

## 2020-02-05 NOTE — ED Notes (Signed)
Pt talking to TTS at the moment. Pt sitting up and talking to TTS.
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
## 2020-02-06 NOTE — BH Assessment (Addendum)
Patient re-assessed on this day by TTS. Continues to meet inpatient criteria. Re-faxed referrals to the following facilities for consideration of bed placement.    [facility]  [facility]  [facility]  [facility]  [facility]  [facility]  [facility]  [facility]  [facility]  [facility]  [facility]  [facility]

## 2020-02-06 NOTE — BH Assessment (Signed)
Reassessment note: Pt presents sitting on her bed with knees to her chest. Affect is constricted and tone is flat. Pt reports she has been depressed for a long time. She had her xanax rx filled q 6 months. Pt reports she had been taking xanax 2mg  TID. She states she doesn't know how the 15 xanax tablets went missing from the pill bottle. Pt reports wants to get off xanax because she is too dependent on them. "I need to stop. I don't know how to go about it". Pt denies SI, HI and AVH. She is anxious to get home to care for her 85 yo mother. Pt reports her brother lives near mother and pt guesses brother is checking on mother. Inpt tx continues to be recommended.

## 2020-02-07 NOTE — ED Provider Notes (Addendum)
Emergency Medicine Observation Re-evaluation Note  Gina Hendricks is a 59 y.o. female, seen on rounds today.  Pt initially presented to the ED for complaints of Delirium Tremens (DTS), V70.1, Depression, and Anxiety Currently, the patient is alert, content, no acute distress.   Physical Exam  BP 96/63 (BP Location: Left Arm)   Pulse 62   Temp 99.2 F (37.3 C) (Oral)   Resp 14   SpO2 96%  Physical Exam General: alert, content, no tremor or shakes.  Cardiac: regular rhythm, pulses normal.  Lungs: cta bil. No increased wob.  Psych: alert, content. Normal mood/affect. Current denies SI.   ED Course / MDM  EKG:    I have reviewed the labs performed to date as well as medications administered while in observation.  Recent changes in the last 24 hours include remaining calm/cooperative.  Plan  Current plan is for [facility] reassessment this AM remains pending. Disposition per bh team.  [facility] team has completed their reassessment and indicates to d/c to home, they have discussed plan with pt (see below).  Disposition: Patient denies current SI, HI and psychosis. There are no signs that she is internally preoccupied. She has had no further behaviors issues while in the ED. She sees Dr. Evelene Croon for outpatient psychiatric services/medication management and she stated that her plans was to call Dr. Evelene Croon to schedule an appointment to discuss alternatives to Xanax as she does have some anxiety. She was encouraged to do so. She stated that she had no current outpatient therapist and resource can be provided so these services can be established.   I discussed with both patient and brother that if in fact patient's symptoms worsen or do not continue to improve or if the patient becomes actively suicidal or homicidal then it is recommended that the patient return to the closest hospital emergency room or call 911 for further evaluation and treatment.  National Suicide Prevention Lifeline 1800-SUICIDE or  (210)550-3620. Family was also educated about removing/locking any firearms, medications or dangerous products from the home.   No evidence of imminent risk to self or others at present. Discussed case with Dr. Lucianne Muss and it was determined that patient is stable enough for discharged and follow-up with outpatient psychiatric services for ongoing psychiatric needs.   ED updated on disposition. Patients brother stated that he would be on his way to to the ED to pick patient for discharge.   Patient has normal mood and affect. Is denying SI or thoughts of harm to self. Pt currently appears stable for d/c per [facility] plan.       Cathren Laine, MD 02/07/20 1043
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
## 2020-02-07 NOTE — Discharge Instructions (Addendum)
It was our pleasure to provide your ER care today - we hope that you feel better.  For benzodiazepine medication such as xanax, you cannot abruptly stop use, but rather need to taper off over a prolonged period - discuss plan for tapering off with your doctor, and discuss alternative therapies to xanax.   Follow up with your doctor and therapist/psychiatrist in the coming week - call office to arrange appointment.   Return to ER if worse, new symptoms, fevers, new or severe pain, trouble breathing, weak/fainting, seizures, or other concern.   For mental health issues and/or crisis, you may also go to the [facility].

## 2020-02-08 ENCOUNTER — Telehealth (HOSPITAL_COMMUNITY): Payer: Self-pay | Admitting: Psychiatry

## 2020-02-08 NOTE — Telephone Encounter (Signed)
This pt was seen in ED recently. Contact pt to offer appointment.

## 2020-02-09 NOTE — Telephone Encounter (Signed)
Reached out to [employer], supervisor to be advised on who to refer her to since this clinic is primarily for indigent and medicaid patients in [facility] and this patient has insurance and lives in [place]. He referred her to the [place] office. Waiting on an outcome from that office for a provider and an appt.

## 2020-02-09 NOTE — Telephone Encounter (Signed)
Thank you :)

## 2020-02-09 NOTE — Telephone Encounter (Signed)
???   Patient lives in [place]  & I was told that we're supposedly to service [address] [facility] residents.

## 2020-02-15 ENCOUNTER — Telehealth (HOSPITAL_COMMUNITY): Payer: Self-pay | Admitting: *Deleted

## 2020-02-15 NOTE — Telephone Encounter (Signed)
Information shared from [place] office that she be referred to [facility] for outpatient services. Called and her and left her a voice mail to call back if she would like to set up outpatient services.
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
## 2020-08-21 ENCOUNTER — Other Ambulatory Visit: Payer: Self-pay

## 2020-08-21 ENCOUNTER — Ambulatory Visit (HOSPITAL_COMMUNITY)
Admission: EM | Admit: 2020-08-21 | Discharge: 2020-08-22 | Disposition: A | Discharge status: Home / Self Care | Payer: No Payment, Other | Attending: Urology | Admitting: Urology

## 2020-08-21 DIAGNOSIS — R45851 Suicidal ideations: Secondary | ICD-10-CM | POA: Insufficient documentation

## 2020-08-21 DIAGNOSIS — Z634 Disappearance and death of family member: Secondary | ICD-10-CM | POA: Insufficient documentation

## 2020-08-21 DIAGNOSIS — Z79899 Other long term (current) drug therapy: Secondary | ICD-10-CM | POA: Insufficient documentation

## 2020-08-21 DIAGNOSIS — F333 Major depressive disorder, recurrent, severe with psychotic symptoms: Secondary | ICD-10-CM | POA: Diagnosis not present

## 2020-08-21 DIAGNOSIS — R634 Abnormal weight loss: Secondary | ICD-10-CM | POA: Insufficient documentation

## 2020-08-21 DIAGNOSIS — Z91128 Patient's intentional underdosing of medication regimen for other reason: Secondary | ICD-10-CM | POA: Insufficient documentation

## 2020-08-21 DIAGNOSIS — F17210 Nicotine dependence, cigarettes, uncomplicated: Secondary | ICD-10-CM | POA: Diagnosis not present

## 2020-08-21 DIAGNOSIS — F411 Generalized anxiety disorder: Secondary | ICD-10-CM | POA: Insufficient documentation

## 2020-08-21 DIAGNOSIS — T424X6A Underdosing of benzodiazepines, initial encounter: Secondary | ICD-10-CM | POA: Insufficient documentation

## 2020-08-21 DIAGNOSIS — Z20822 Contact with and (suspected) exposure to covid-19: Secondary | ICD-10-CM | POA: Insufficient documentation

## 2020-08-21 LAB — POCT URINE DRUG SCREEN - MANUAL ENTRY (I-SCREEN)
POC Amphetamine UR: NOT DETECTED
POC Buprenorphine (BUP): NOT DETECTED
POC Cocaine UR: NOT DETECTED
POC Marijuana UR: NOT DETECTED
POC Methadone UR: NOT DETECTED
POC Methamphetamine UR: NOT DETECTED
POC Morphine: NOT DETECTED
POC Oxazepam (BZO): NOT DETECTED
POC Oxycodone UR: NOT DETECTED
POC Secobarbital (BAR): NOT DETECTED

## 2020-08-21 LAB — POC SARS CORONAVIRUS 2 AG: SARS Coronavirus 2 Ag: NEGATIVE

## 2020-08-21 LAB — POCT PREGNANCY, URINE: Preg Test, Ur: NEGATIVE

## 2020-08-21 MED ORDER — ALUM & MAG HYDROXIDE-SIMETH 200-200-20 MG/5ML PO SUSP: 30.0000 mL | ORAL | Status: DC | PRN

## 2020-08-21 MED ORDER — PAROXETINE HCL 10 MG PO TABS
10.0000 mg | ORAL_TABLET | Freq: Once | ORAL | Status: AC
  Administered 2020-08-22: 10 mg via ORAL
  Filled 2020-08-21: qty 1

## 2020-08-21 MED ORDER — TRAZODONE HCL 50 MG PO TABS: 50.0000 mg | ORAL_TABLET | Freq: Every evening | ORAL | Status: DC | PRN

## 2020-08-21 MED ORDER — MAGNESIUM HYDROXIDE 400 MG/5ML PO SUSP: 30.0000 mL | Freq: Every day | ORAL | Status: DC | PRN

## 2020-08-21 MED ORDER — NICOTINE 21 MG/24HR TD PT24
21.0000 mg | MEDICATED_PATCH | Freq: Every day | TRANSDERMAL | Status: DC
  Administered 2020-08-22: 21 mg via TRANSDERMAL
  Filled 2020-08-21: qty 1

## 2020-08-21 MED ORDER — HYDROXYZINE HCL 25 MG PO TABS: 25.0000 mg | ORAL_TABLET | Freq: Three times a day (TID) | ORAL | Status: DC | PRN

## 2020-08-21 MED ORDER — QUETIAPINE FUMARATE 25 MG PO TABS
25.0000 mg | ORAL_TABLET | Freq: Every day | ORAL | Status: DC
  Administered 2020-08-21: 25 mg via ORAL
  Filled 2020-08-21: qty 1

## 2020-08-21 MED ORDER — ACETAMINOPHEN 325 MG PO TABS: 650.0000 mg | ORAL_TABLET | Freq: Four times a day (QID) | ORAL | Status: DC | PRN

## 2020-08-21 NOTE — ED Notes (Signed)
Clothing in locker 26

## 2020-08-21 NOTE — ED Provider Notes (Incomplete)
Behavioral Health Admission H&P [facility] & OBS)  Date: 08/22/20 Patient Name: Gina Hendricks MRN: [id number] Chief Complaint:  Chief Complaint  Patient presents with  . Paranoid   Chief Complaint/Presenting Problem: Paranoia  Diagnoses:  Final diagnoses:  MDD (major depressive disorder), recurrent, severe, with psychosis (HCC)    HPI: Gina Hendricks is a 59y/o female. Patient presented voluntarily to [facility] accompanied by her friend Doran Durand who also participated in assessment at the request of patient. Patient presented with chief complaint of "suicidal thoughts, anxiety, worsening depression, and paranoia." Patient reports that she stopped taking her prescription Xanax on Jul 12 2020. She reported "I quit taking them because I promised my mom that I would stop taking xanax." Patient reports that she has been having "suicidal ideations with plans to overdose on her mother's left over medications." She report worsening depressive symptoms of weight loss, poor appetite, crying daily, worthlessness, hopelessness, not showering and isolating. Patient report that she has not left her home in over 6 weeks. She reports that her triggers are "mother recently passing away and myself." She states "I am my own trigger, I dont care for my self, I can't function or handle life."   Patient continues to endorse SI with plans to overdose on her mother's medications. Patient denies HI, AVH. Patient is endorsing paranoia. Patient states "Im constantly looking over my shoulder like someone is out to get me; and I'm scared of myself. I'm paranoid about what I might do to myself." Patient reports a past history of alcohol abuse; she reports that she quit alcohol consumption ~5 years ago. She denies illicit drug use. She lives at home alone.   Collateral information was obtain from patient's friend Doran Durand [phone]). Ms. Lupita Leash reports that patient was suicidal 2 days ago. Lupita Leash stated "She grabbed a knife  and wanted to hurt herself. I had to talk her down from hurting herself with a butcher knife." She reports that patient's mother passed away on September 05, 2020 and patient is having a tough time grieving her mother. Ms. Lupita Leash believes that patient is a danger to self and will not maintain safety because patient lives alone since the passing of patient's mother.   PHQ 2-9:  Flowsheet Row ED from 02/04/2020 in [facility] EMERGENCY DEPARTMENT Office Visit from 11/30/2019 in [facility] Office Visit from 01/10/2018 in [facility]  Thoughts that you would be better off dead, or of hurting yourself in some way More than half the days Not at all Not at all  PHQ-9 Total Score 17 8 11       Flowsheet Row ED from 08/21/2020 in [facility] ED from 02/04/2020 in [place] EMERGENCY DEPARTMENT  C-SSRS RISK CATEGORY High Risk High Risk       Total Time spent with patient: 30 minutes  Musculoskeletal  Strength & Muscle Tone: within normal limits Gait & Station: normal Patient leans: Right  Psychiatric Specialty Exam  Presentation General Appearance: Fairly Groomed  Eye Contact:Minimal  Speech:Blocked  Speech Volume:Decreased  Handedness:Right   Mood and Affect  Mood:Depressed; Anxious; Irritable  Affect:Congruent   Thought Process  Thought Processes:Coherent  Descriptions of Associations:Intact  Orientation:Full (Time, Place and Person)  Thought Content:WDL  Diagnosis of Schizophrenia or Schizoaffective disorder in past: No   Hallucinations:No data recorded Ideas of Reference:Paranoia  Suicidal Thoughts:Suicidal Thoughts: Yes, Passive SI Passive Intent and/or Plan: With Plan; With Means to Carry Out  Homicidal Thoughts:Homicidal Thoughts: No  Sensorium  Memory:Immediate Good; Recent Good; Remote Good  Judgment:Fair  Insight:Good   Executive Functions  Concentration:Good  Attention  Span:Good  Recall:Good  Fund of Knowledge:Good  Language:Good   Psychomotor Activity  Psychomotor Activity:Psychomotor Activity: Normal   Assets  Assets:Communication Skills; Desire for Improvement; Housing; Social Support; Transportation   Sleep  Sleep:Sleep: Fair   Nutritional Assessment (For OBS and [facility] admissions only) Has the patient had a weight loss or gain of 10 pounds or more in the last 3 months?: Yes Has the patient had a decrease in food intake/or appetite?: Yes Does the patient have dental problems?: No Does the patient have eating habits or behaviors that may be indicators of an eating disorder including binging or inducing vomiting?: No Has the patient recently lost weight without trying?: Yes, 2-13 lbs. Has the patient been eating poorly because of a decreased appetite?: Yes Malnutrition Screening Tool Score: 2    Physical Exam Vitals and nursing note reviewed.  Constitutional:      General: She is not in acute distress.    Appearance: She is well-developed.  HENT:     Head: Normocephalic and atraumatic.  Eyes:     Conjunctiva/sclera: Conjunctivae normal.  Cardiovascular:     Rate and Rhythm: Normal rate and regular rhythm.     Heart sounds: No murmur heard.   Pulmonary:     Effort: Pulmonary effort is normal. No respiratory distress.     Breath sounds: Normal breath sounds.  Abdominal:     Palpations: Abdomen is soft.     Tenderness: There is no abdominal tenderness.  Musculoskeletal:     Cervical back: Neck supple.  Skin:    General: Skin is warm and dry.  Neurological:     Mental Status: She is alert and oriented to person, place, and time.  Psychiatric:        Attention and Perception: Attention normal. She does not perceive auditory or visual hallucinations.        Mood and Affect: Mood is anxious and depressed.        Speech: Speech normal.        Behavior: Behavior normal. Behavior is cooperative.        Thought Content: Thought  content is paranoid. Thought content is not delusional. Thought content does not include homicidal or suicidal ideation. Thought content does not include homicidal or suicidal plan.        Cognition and Memory: Cognition normal.        Judgment: Judgment normal.    Review of Systems  Constitutional: Positive for weight loss. Negative for fever.  HENT: Negative.  Negative for ear discharge and ear pain.   Eyes: Negative.  Negative for pain, discharge and redness.  Respiratory: Negative.  Negative for cough and hemoptysis.   Cardiovascular: Negative.  Negative for chest pain and palpitations.  Gastrointestinal: Negative.  Negative for abdominal pain and vomiting.  Genitourinary: Negative.   Musculoskeletal: Negative.   Skin: Negative.   Neurological: Negative.   Endo/Heme/Allergies: Negative.   Psychiatric/Behavioral: Positive for depression and suicidal ideas. Negative for hallucinations and substance abuse. The patient is nervous/anxious.     Blood pressure 129/80, pulse 97, temperature 98.2 F (36.8 C), temperature source Oral, resp. rate 18, SpO2 99 %. There is no height or weight on file to calculate BMI.  Past Psychiatric History: Anxiety, Depression,    Is the patient at risk to self? Yes  Has the patient been a risk to self in the past 6  months? No .    Has the patient been a risk to self within the distant past? No   Is the patient a risk to others? No   Has the patient been a risk to others in the past 6 months? No   Has the patient been a risk to others within the distant past? No   Past Medical History:  Past Medical History:  Diagnosis Date  . Anxiety   . Depression   . Heart murmur     Past Surgical History:  Procedure Laterality Date  . FINGER SURGERY Right    right index finger    Family History:  Family History  Problem Relation Age of Onset  . Arthritis Mother   . Vision loss Mother   . Heart disease Father   . Alcohol abuse Brother   . Depression  Brother   . Heart disease Maternal Grandmother   . Alcohol abuse Maternal Grandfather   . Heart disease Paternal Grandfather   . Alcohol abuse Brother     Social History:  Social History   Socioeconomic History  . Marital status: Single    Spouse name: Not on file  . Number of children: Not on file  . Years of education: Not on file  . Highest education level: Not on file  Occupational History  . Not on file  Tobacco Use  . Smoking status: Current Every Day Smoker    Packs/day: 1.00    Types: Cigarettes    Start date: 08/24/1980  . Smokeless tobacco: Never Used  Vaping Use  . Vaping Use: Never used  Substance and Sexual Activity  . Alcohol use: No  . Drug use: No  . Sexual activity: Not Currently  Other Topics Concern  . Not on file  Social History Narrative  . Not on file   Social Determinants of Health   Financial Resource Strain: Not on file  Food Insecurity: Not on file  Transportation Needs: Not on file  Physical Activity: Not on file  Stress: Not on file  Social Connections: Not on file  Intimate Partner Violence: Not on file    SDOH:  SDOH Screenings   Alcohol Screen: Not on file  Depression (PHQ2-9): Medium Risk  . PHQ-2 Score: 17  Financial Resource Strain: Not on file  Food Insecurity: Not on file  Housing: Not on file  Physical Activity: Not on file  Social Connections: Not on file  Stress: Not on file  Tobacco Use: High Risk  . Smoking Tobacco Use: Current Every Day Smoker  . Smokeless Tobacco Use: Never Used  Transportation Needs: Not on file    Last Labs:  Admission on 08/21/2020  Component Date Value Ref Range Status  . WBC 08/21/2020 14.9* 4.0 - 10.5 K/uL Final  . RBC 08/21/2020 4.08  3.87 - 5.11 MIL/uL Final  . Hemoglobin 08/21/2020 13.1  12.0 - 15.0 g/dL Final  . HCT 95/62/1308 38.1  36.0 - 46.0 % Final  . MCV 08/21/2020 93.4  80.0 - 100.0 fL Final  . MCH 08/21/2020 32.1  26.0 - 34.0 pg Final  . MCHC 08/21/2020 34.4  30.0 -  36.0 g/dL Final  . RDW 65/78/4696 13.5  11.5 - 15.5 % Final  . Platelets 08/21/2020 243  150 - 400 K/uL Final  . nRBC 08/21/2020 0.0  0.0 - 0.2 % Final  . Neutrophils Relative % 08/21/2020 76  % Final  . Neutro Abs 08/21/2020 11.3* 1.7 - 7.7 K/uL Final  . Lymphocytes  Relative 08/21/2020 16  % Final  . Lymphs Abs 08/21/2020 2.4  0.7 - 4.0 K/uL Final  . Monocytes Relative 08/21/2020 7  % Final  . Monocytes Absolute 08/21/2020 1.0  0.1 - 1.0 K/uL Final  . Eosinophils Relative 08/21/2020 1  % Final  . Eosinophils Absolute 08/21/2020 0.1  0.0 - 0.5 K/uL Final  . Basophils Relative 08/21/2020 0  % Final  . Basophils Absolute 08/21/2020 0.1  0.0 - 0.1 K/uL Final  . Immature Granulocytes 08/21/2020 0  % Final  . Abs Immature Granulocytes 08/21/2020 0.04  0.00 - 0.07 K/uL Final   Performed at [facility], [address] [place], [place] [address]  . Hgb A1c MFr Bld 08/21/2020 5.5  4.8 - 5.6 % Final   Comment: (NOTE) Pre diabetes:          5.7%-6.4%  Diabetes:              >6.4%  Glycemic control for   <7.0% adults with diabetes   . Mean Plasma Glucose 08/21/2020 111.15  mg/dL Final   Performed at [facility], [address] [place], [place] [address]  . Alcohol, Ethyl (B) 08/21/2020 <10  <10 mg/dL Final   Comment: (NOTE) Lowest detectable limit for serum alcohol is 10 mg/dL.  For medical purposes only. Performed at [facility], [address] [place], [place] [address]   . POC Amphetamine UR 08/21/2020 None Detected  NONE DETECTED (Cut Off Level 1000 ng/mL) Final  . POC Secobarbital (BAR) 08/21/2020 None Detected  NONE DETECTED (Cut Off Level 300 ng/mL) Final  . POC Buprenorphine (BUP) 08/21/2020 None Detected  NONE DETECTED (Cut Off Level 10 ng/mL) Final  . POC Oxazepam (BZO) 08/21/2020 None Detected  NONE DETECTED (Cut Off Level 300 ng/mL) Final  . POC Cocaine UR 08/21/2020 None Detected  NONE DETECTED (Cut Off Level 300 ng/mL) Final  . POC Methamphetamine UR  08/21/2020 None Detected  NONE DETECTED (Cut Off Level 1000 ng/mL) Final  . POC Morphine 08/21/2020 None Detected  NONE DETECTED (Cut Off Level 300 ng/mL) Final  . POC Oxycodone UR 08/21/2020 None Detected  NONE DETECTED (Cut Off Level 100 ng/mL) Final  . POC Methadone UR 08/21/2020 None Detected  NONE DETECTED (Cut Off Level 300 ng/mL) Final  . POC Marijuana UR 08/21/2020 None Detected  NONE DETECTED (Cut Off Level 50 ng/mL) Final  . SARS Coronavirus 2 Ag 08/21/2020 NEGATIVE  NEGATIVE Final   Comment: (NOTE) SARS-CoV-2 antigen NOT DETECTED.   Negative results are presumptive.  Negative results do not preclude SARS-CoV-2 infection and should not be used as the sole basis for treatment or other patient management decisions, including infection  control decisions, particularly in the presence of clinical signs and  symptoms consistent with COVID-19, or in those who have been in contact with the virus.  Negative results must be combined with clinical observations, patient history, and epidemiological information. The expected result is Negative.  Fact Sheet for Patients: https://www.jennings-kim.com/https://www.fda.gov/media/141569/download  Fact Sheet for Healthcare Providers: https://alexander-rogers.biz/https://www.fda.gov/media/141568/download  This test is not yet approved or cleared by the [place] FDA and  has been authorized for detection and/or diagnosis of SARS-CoV-2 by FDA under an Emergency Use Authorization (EUA).  This EUA will remain in effect (meaning this test can be used) for the duration of  the COV  ID-19 declaration under Section 564(b)(1) of the Act, 21 U.S.C. section 360bbb-3(b)(1), unless the authorization is terminated or revoked sooner.    . Preg Test, Ur 08/21/2020 NEGATIVE  NEGATIVE Final   Comment:        THE SENSITIVITY OF THIS METHODOLOGY IS >24 mIU/mL     Allergies: Zoloft [sertraline hcl]  PTA Medications: (Not in a hospital admission)   Medical Decision Making  Admit  patient to [facility] for continuous observation and assessment while awaiting in patient psychiatric bed    Recommendations  Based on my evaluation the patient does not appear to have an emergency medical condition.  Maricela Bo, NP 08/22/20  12:57 AM

## 2020-08-21 NOTE — ED Triage Notes (Signed)
Pt A& O x 4, presents with paranoia and grieving the lost of her mother.  Pt admits to using Xanax for the past 30 years.  Denies SI, HI or AVH.

## 2020-08-21 NOTE — BH Assessment (Addendum)
Trianna D. Mullan urgent MR #[id number]: 60 year old presents this date with her friend Doran Durand, [phone].  Pt reports that she is paranoid and have  been using xanax for 30 years.  Pt reports that she is grieving the lost of mother; also stop the drug xanax on July 02, 2020. Pt denies SI, HI, or AVH.  Pt reports prior MH diagnosis and prescribed medication for symptom management  several years ago.

## 2020-08-21 NOTE — BH Assessment (Signed)
Comprehensive Clinical Assessment (CCA) Note  08/21/2020 Gina Hendricks [id number]  Chief Complaint:  Chief Complaint  Patient presents with  . 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 Acute risk factors for suicide include: family or marital conflict and social withdrawal/isolation. Protective factors for this patient include: positive social support, positive therapeutic relationship, responsibility to others (children, family) and coping skills. Considering these factors, the overall suicide risk at this point appears to be high.    Disposition: Gina Asper NP recommends overnight observation and to be reassessed by psychiatry.  Disposition discussed with [name] at [facility] Gina Hendricks is a 60 years old patient who presents voluntarily to [facility], accompanied by her friend Gina Hendricks, [phone].   Pt reports feeling increasingly depressed for the past month due to the death of her mother who died in 06-28-20. Pt admitted "I want to hurt myself with either a knife or overdose on my pills".  Pt reports that she is experiencing crying spells, isolation, sadness, loss of interests, feelings of worthlessness and worrying.  Pt reports that she is starving herself daily "I drink diet cokes and smoke cigarettes to supplement food".  Pt reports that she is sleeping two hours during the night.  Pt denies manic symptoms.  Pt reports one previous suicide attempt, by overdosing, unable to identify time and date.  Pt admitted to hearing voices daily. Pt denies HI.  Pt admitted to feeling  paranoid and afraid of things that surrounds her daily.  Pt reports that she have not drink alcohol in five years; admits to her last use of xanax was on July 12, 2020.Marland Kitchen  Pt identifies her primary stressor as low self esteem, not caring about herself and isolating "they don't care about me".  Pt reports that she was living with her mother, currently living temporary with friend, Gina Hendricks.  Pt reports that she is unemployed and has lost interests in her hobbies.  Pt reports no prior history of mental illness or substance used.  Pt denies any history of abuse or trauma.  Pt denies any current legal problems.  Pt says she is currently not receiving weekly outpatient therapy; also is not receiving outpatient medication management.  Pt reports that she is not taking prescribed medication.  Pt reports one previous inpatient at [facility], unable to identify date and time.  Pt is dressed disheveled, alert oriented x 4 with shaking speech and restless motor behavior.  Eye contact is fleeting.  Pt is mood is dysphoria and affect is anxious.  Thought process is coherent.  Pt's insight is fair and judgment is fair.  There is no indication Pt is currently responding to internal stimuli or experiencing delusional thought content.  Pt was cooperative throughout the assessment.Marland Kitchen             CCA Screening, Triage and Referral (STR)  Patient Reported Information How did you hear about [place]? Family/Friend  Referral name: Gina Hendricks  Referral phone number: [phone]   Whom do you see for routine medical problems? Other (Comment)  Practice/Facility Name: [facility]  Practice/Facility Phone Number: No data  recorded Name of Contact: No data recorded Contact Number: No data recorded Contact Fax Number: No data recorded Prescriber Name: No data recorded Prescriber Address (if known): No data recorded  What Is the Reason for Your Visit/Call Today? P  How Long Has This Been Causing You  Problems? 1-6 months  What Do You Feel Would Help You the Most Today? Treatment for Depression or other mood problem   Have You Recently Been in Any Inpatient Treatment (Hospital/Detox/Crisis Center/28-Day Program)? No  Name/Location of Program/Hospital:No data recorded How Long Were You There? No data recorded When Were You Discharged? No data recorded  Have You Ever Received Services From [facility] Before? Yes  Who Do You See at [facility]? ED doctor   Have You Recently Had Any Thoughts About Hurting Yourself? Yes  Are You Planning to Commit Suicide/Harm Yourself At This time? Yes   Have you Recently Had Thoughts About Hurting Someone Gina Hendricks? No  Explanation: No data recorded  Have You Used Any Alcohol or Drugs in the Past 24 Hours? Yes  How Long Ago Did You Use Drugs or Alcohol? No data recorded What Did You Use and How Much? xanax   Do You Currently Have a Therapist/Psychiatrist? No  Name of Therapist/Psychiatrist: Rupinder Evelene Hendricks   Have You Been Recently Discharged From Any Office Practice or Programs? No  Explanation of Discharge From Practice/Program: No data recorded    CCA Screening Triage Referral Assessment Type of Contact: Face-to-Face  Is this Initial or Reassessment? Initial Assessment  Date Telepsych consult ordered in [facility]:  02/04/2020  Time Telepsych consult ordered in [facility]:  1449   Patient Reported Information Reviewed? Yes  Patient Left Without Being Seen? No data recorded Reason for Not Completing Assessment: No data recorded  Collateral Involvement: Gina Hendricks, friend [phone]   Does Patient Have a Court Appointed Legal Guardian? No data recorded Name and Contact of Legal Guardian: No data recorded If Minor and Not Living with Parent(s), Who has Custody? UTA  Is CPS involved or ever been involved? Never  Is APS involved or ever been involved? Never   Patient Determined To Be At Risk for Harm To Self or Others Based on Review  of Patient Reported Information or Presenting Complaint? Yes, for Self-Harm  Method: No data recorded Availability of Means: No data recorded Intent: No data recorded Notification Required: No data recorded Additional Information for Danger to Others Potential: No data recorded Additional Comments for Danger to Others Potential: No data recorded Are There Guns or Other Weapons in Your Home? No data recorded Types of Guns/Weapons: No data recorded Are These Weapons Safely Secured?                            No data recorded Who Could Verify You Are Able To Have These Secured: No data recorded Do You Have any Outstanding Charges, Pending Court Dates, Parole/Probation? No data recorded Contacted To Inform of Risk of Harm To Self or Others: Family/Significant Other:   Location of Assessment: [facility] Assessment Services   Does Patient Present under Involuntary Commitment? No  IVC Papers Initial File Date: No data recorded  [place] of Residence: [place]   Patient Currently Receiving the Following Services: -- (UTA)   Determination of Need: Emergent (2 hours)   Options For Referral: Inpatient Hospitalization     CCA Biopsychosocial Intake/Chief Complaint:  Paranoia  Current Symptoms/Problems: anxious, irritable, restlessness, worrying, difficutly concentrating   Patient Reported  Schizophrenia/Schizoaffective Diagnosis in Past: No   Strengths: UTA  Preferences: UTA  Abilities: UTA   Type of Services Patient Feels are Needed: UTA   Initial Clinical Notes/Concerns: trembling, shaking   Mental Health Symptoms Depression:  Change in energy/activity; Difficulty Concentrating; Fatigue; Tearfulness; Hopelessness; Increase/decrease in appetite   Duration of Depressive symptoms: Greater than two weeks   Mania:  None   Anxiety:   Difficulty concentrating; Fatigue; Irritability; Restlessness; Sleep; Worrying (Pt reports that she is sleeping two hours during the night)    Psychosis:  None   Duration of Psychotic symptoms: No data recorded  Trauma:  -- (Pt reports that she is still grieving the lost of her mother in January 2022)   Obsessions:  Disrupts routine/functioning; Cause anxiety   Compulsions:  Disrupts with routine/functioning   Inattention:  Avoids/dislikes activities that require focus (Pt reports unable to watch television)   Hyperactivity/Impulsivity:  N/A   Oppositional/Defiant Behaviors:  None   Emotional Irregularity:  Chronic feelings of emptiness   Other Mood/Personality Symptoms:  problems concentrating, indecisiveness    Mental Status Exam Appearance and self-care  Stature:  Average   Weight:  Average weight   Clothing:  Disheveled   Grooming:  Neglected   Cosmetic use:  None   Posture/gait:  Rigid   Motor activity:  Restless   Sensorium  Attention:  Persistent   Concentration:  Anxiety interferes   Orientation:  Object; Person; Place   Recall/memory:  Normal   Affect and Mood  Affect:  Anxious; Tearful   Mood:  Dysphoric   Relating  Eye contact:  Fleeting   Facial expression:  Angry; Sad   Attitude toward examiner:  Irritable   Thought and Language  Speech flow: Slow   Thought content:  Suspicious   Preoccupation:  Suicide   Hallucinations:  None   Organization:  No data recorded  [employer] of Knowledge:  Fair   Intelligence:  Average   Abstraction:  Normal (UTA)   Judgement:  Fair   Reality Testing:  Distorted   Insight:  Fair   Decision Making:  Impulsive   Social Functioning  Social Maturity:  Isolates   Social Judgement:  Victimized   Stress  Stressors:  Grief/losses   Coping Ability:  Overwhelmed   Skill Deficits:  Self-control; Self-care; Responsibility; Decision making; Activities of daily living   Supports:  Support needed     Religion: Religion/Spirituality How Might This Affect Treatment?: UTA  Leisure/Recreation: Leisure /  Recreation Do You Have Hobbies?: Yes Leisure and Hobbies: sports, motorcycle  Exercise/Diet: Exercise/Diet Do You Exercise?: No Have You Gained or Lost A Significant Amount of Weight in the Past Six Months?: Yes-Lost Number of Pounds Lost?:  (UTA) Do You Follow a Special Diet?: No (Pt reports that she have not been eating) Do You Have Any Trouble Sleeping?: Yes Explanation of Sleeping Difficulties: Pt reports that she is sleeping two hours during the night   CCA Employment/Education Employment/Work Situation: Employment / Work Situation Employment situation: Unemployed What is the longest time patient has a held a job?: UTA Where was the patient employed at that time?: UTA  Education: Education Is Patient Currently Attending School?: No 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) Name of High School: UTA Did Garment/textile technologist From [employer]?:  (UTA) Did You Attend College?:  (UTA) Did You Have Any Special Interests In School?: UTA Did You Have An Individualized Education Program (IIEP):  (UTA) Did You Have Any Difficulty At School?:  (UTA)  Patient's Education Has Been Impacted by Current Illness:  (UTA)   CCA Family/Childhood History Family and Relationship History: Family history Are you sexually active?:  (UTA) What is your sexual orientation?: UTA Has your sexual activity been affected by drugs, alcohol, medication, or emotional stress?: UTA Does patient have children?: No  Childhood History:  Childhood History By whom was/is the patient raised?: Mother Additional childhood history information: Pt reports that her mother died in January 2022 Description of patient's relationship with caregiver when they were a child: caring Patient's description of current relationship with people who raised him/her: supportive How were you disciplined when you got in trouble as a child/adolescent?: UTA Does patient have siblings?: No Did patient suffer any verbal/emotional/physical/sexual abuse as  a child?: No Did patient suffer from severe childhood neglect?: No Has patient ever been sexually abused/assaulted/raped as an adolescent or adult?: No Was the patient ever a victim of a crime or a disaster?: No Witnessed domestic violence?: No Has patient been affected by domestic violence as an adult?: No  Child/Adolescent Assessment:     CCA Substance Use Alcohol/Drug Use: Alcohol / Drug Use Pain Medications: See MRA Prescriptions: See MRA Over the Counter: See MRA History of alcohol / drug use?: Yes Substance #1 Name of Substance 1: Xanxa 1 - Age of First Use: UTA 1 - Amount (size/oz): UTA 1 - Frequency: UTA 1 - Duration: UTA 1 - Last Use / Amount: UTA 1 - Method of Aquiring: UTA 1- Route of Use: UTA Substance #2 Name of Substance 2: cigeretteds 2 - Age of First Use: UTA 2 - Amount (size/oz): UTA 2 - Frequency: UTA 2 - Duration: UTA 2 - Last Use / Amount: UTA 2 - Method of Aquiring: UTA 2 - Route of Substance Use: UTA                     ASAM's:  Six Dimensions of Multidimensional Assessment  Dimension 1:  Acute Intoxication and/or Withdrawal Potential:   Dimension 1:  Description of individual's past and current experiences of substance use and withdrawal: Pt reports that she have been using Benzo for 30 years  Dimension 2:  Biomedical Conditions and Complications:   Dimension 2:  Description of patient's biomedical conditions and  complications: Pt reports that she is not eating, neasuated, vomiting  Dimension 3:  Emotional, Behavioral, or Cognitive Conditions and Complications:  Dimension 3:  Description of emotional, behavioral, or cognitive conditions and complications: depression and anxietiy  Dimension 4:  Readiness to Change:  Dimension 4:  Description of Readiness to Change criteria: mild  Dimension 5:  Relapse, Continued use, or Continued Problem Potential:  Dimension 5:  Relapse, continued use, or continued problem potential critiera description:  precontemplation  Dimension 6:  Recovery/Living Environment:  Dimension 6:  Recovery/Iiving environment criteria description: pt reports a supportive friend  ASAM Severity Score: ASAM's Severity Rating Score: 12  ASAM Recommended Level of Treatment:     Substance use Disorder (SUD) Substance Use Disorder (SUD)  Checklist Symptoms of Substance Use: Continued use despite having a persistent/recurrent physical/psychological problem caused/exacerbated by use,Continued use despite persistent or recurrent social, interpersonal problems, caused or exacerbated by use,Evidence of withdrawal (Comment),Large amounts of time spent to obtain, use or recover from the substance(s),Presence of craving or strong urge to use,Persistent desire or unsuccessful efforts to cut down or control use,Recurrent use that results in a failure to fulfill major role obligations (work, school, home),Repeated use in physically hazardous situations,Social, occupational, recreational activities  given up or reduced due to use  Recommendations for Services/Supports/Treatments: Recommendations for Services/Supports/Treatments Recommendations For Services/Supports/Treatments: Residential-Level 2  DSM5 Diagnoses: Patient Active Problem List   Diagnosis Date Noted  . Severe episode of recurrent major depressive disorder, without psychotic features (HCC)   . Sedative dependence (HCC)   . Depression, major, single episode, moderate (HCC) 12/01/2019  . Right hip pain 12/01/2019     Referrals to Alternative Service(s): Referred to Alternative Service(s):   Place:   Date:   Time:    Referred to Alternative Service(s):   Place:   Date:   Time:    Referred to Alternative Service(s):   Place:   Date:   Time:    Referred to Alternative Service(s):   Place:   Date:   Time:     Meryle Ready, Counselor

## 2020-08-22 ENCOUNTER — Inpatient Hospital Stay (HOSPITAL_COMMUNITY)
Admission: AD | Admit: 2020-08-22 | Discharge: 2020-08-27 | DRG: 885 | Disposition: A | Discharge status: Home / Self Care | Payer: Federal, State, Local not specified - Other | Source: Intra-hospital | Attending: Psychiatry | Admitting: Psychiatry

## 2020-08-22 ENCOUNTER — Encounter (HOSPITAL_COMMUNITY): Payer: Self-pay | Admitting: Psychiatry

## 2020-08-22 DIAGNOSIS — R45851 Suicidal ideations: Secondary | ICD-10-CM | POA: Diagnosis present

## 2020-08-22 DIAGNOSIS — Z20822 Contact with and (suspected) exposure to covid-19: Secondary | ICD-10-CM | POA: Diagnosis present

## 2020-08-22 DIAGNOSIS — K5900 Constipation, unspecified: Secondary | ICD-10-CM | POA: Diagnosis present

## 2020-08-22 DIAGNOSIS — G4700 Insomnia, unspecified: Secondary | ICD-10-CM | POA: Diagnosis present

## 2020-08-22 DIAGNOSIS — F411 Generalized anxiety disorder: Secondary | ICD-10-CM | POA: Diagnosis present

## 2020-08-22 DIAGNOSIS — F419 Anxiety disorder, unspecified: Secondary | ICD-10-CM | POA: Diagnosis present

## 2020-08-22 DIAGNOSIS — F17210 Nicotine dependence, cigarettes, uncomplicated: Secondary | ICD-10-CM | POA: Diagnosis present

## 2020-08-22 DIAGNOSIS — F332 Major depressive disorder, recurrent severe without psychotic features: Principal | ICD-10-CM | POA: Diagnosis present

## 2020-08-22 DIAGNOSIS — F333 Major depressive disorder, recurrent, severe with psychotic symptoms: Secondary | ICD-10-CM | POA: Insufficient documentation

## 2020-08-22 LAB — TSH: TSH: 1.442 u[IU]/mL (ref 0.350–4.500)

## 2020-08-22 LAB — CBC WITH DIFFERENTIAL/PLATELET
Abs Immature Granulocytes: 0.04 10*3/uL (ref 0.00–0.07)
Basophils Absolute: 0.1 10*3/uL (ref 0.0–0.1)
Basophils Relative: 0 %
Eosinophils Absolute: 0.1 10*3/uL (ref 0.0–0.5)
Eosinophils Relative: 1 %
HCT: 38.1 % (ref 36.0–46.0)
Hemoglobin: 13.1 g/dL (ref 12.0–15.0)
Immature Granulocytes: 0 %
Lymphocytes Relative: 16 %
Lymphs Abs: 2.4 10*3/uL (ref 0.7–4.0)
MCH: 32.1 pg (ref 26.0–34.0)
MCHC: 34.4 g/dL (ref 30.0–36.0)
MCV: 93.4 fL (ref 80.0–100.0)
Monocytes Absolute: 1 10*3/uL (ref 0.1–1.0)
Monocytes Relative: 7 %
Neutro Abs: 11.3 10*3/uL — ABNORMAL HIGH (ref 1.7–7.7)
Neutrophils Relative %: 76 %
Platelets: 243 10*3/uL (ref 150–400)
RBC: 4.08 MIL/uL (ref 3.87–5.11)
RDW: 13.5 % (ref 11.5–15.5)
WBC: 14.9 10*3/uL — ABNORMAL HIGH (ref 4.0–10.5)
nRBC: 0 % (ref 0.0–0.2)

## 2020-08-22 LAB — LIPID PANEL
Cholesterol: 157 mg/dL (ref 0–200)
HDL: 44 mg/dL (ref >40)
LDL Cholesterol: 102 mg/dL — ABNORMAL HIGH (ref 0–99)
Total CHOL/HDL Ratio: 3.6 RATIO
Triglycerides: 54 mg/dL (ref <150)
VLDL: 11 mg/dL (ref 0–40)

## 2020-08-22 LAB — HEMOGLOBIN A1C
Hgb A1c MFr Bld: 5.5 % (ref 4.8–5.6)
Mean Plasma Glucose: 111.15 mg/dL

## 2020-08-22 LAB — RESP PANEL BY RT-PCR (FLU A&B, COVID) ARPGX2
Influenza A by PCR: NEGATIVE
Influenza B by PCR: NEGATIVE
SARS Coronavirus 2 by RT PCR: NEGATIVE

## 2020-08-22 LAB — COMPREHENSIVE METABOLIC PANEL
ALT: 15 U/L (ref 0–44)
AST: 19 U/L (ref 15–41)
Albumin: 3.6 g/dL (ref 3.5–5.0)
Alkaline Phosphatase: 87 U/L (ref 38–126)
Anion gap: 7 (ref 5–15)
BUN: 14 mg/dL (ref 6–20)
CO2: 23 mmol/L (ref 22–32)
Calcium: 9.5 mg/dL (ref 8.9–10.3)
Chloride: 108 mmol/L (ref 98–111)
Creatinine, Ser: 0.67 mg/dL (ref 0.44–1.00)
GFR, Estimated: >60 mL/min (ref >60)
Glucose, Bld: 105 mg/dL — ABNORMAL HIGH (ref 70–99)
Potassium: 3.9 mmol/L (ref 3.5–5.1)
Sodium: 138 mmol/L (ref 135–145)
Total Bilirubin: 0.4 mg/dL (ref 0.3–1.2)
Total Protein: 6.7 g/dL (ref 6.5–8.1)

## 2020-08-22 LAB — ETHANOL: Alcohol, Ethyl (B): <10 mg/dL (ref <10)

## 2020-08-22 MED ORDER — NICOTINE 21 MG/24HR TD PT24
21.0000 mg | MEDICATED_PATCH | Freq: Every day | TRANSDERMAL | Status: DC
  Administered 2020-08-23 – 2020-08-27 (×5): 21 mg via TRANSDERMAL
  Filled 2020-08-22 (×6): qty 1

## 2020-08-22 MED ORDER — QUETIAPINE FUMARATE 25 MG PO TABS: 25.0000 mg | ORAL_TABLET | Freq: Every day | ORAL | Status: DC

## 2020-08-22 MED ORDER — MAGNESIUM HYDROXIDE 400 MG/5ML PO SUSP: 30.0000 mL | Freq: Every day | ORAL | Status: DC | PRN

## 2020-08-22 MED ORDER — ESCITALOPRAM OXALATE 10 MG PO TABS
10.0000 mg | ORAL_TABLET | Freq: Every day | ORAL | Status: DC
  Administered 2020-08-22: 10 mg via ORAL
  Filled 2020-08-22: qty 1

## 2020-08-22 MED ORDER — ESCITALOPRAM OXALATE 10 MG PO TABS
10.0000 mg | ORAL_TABLET | Freq: Every day | ORAL | Status: DC
  Administered 2020-08-22 – 2020-08-27 (×6): 10 mg via ORAL
  Filled 2020-08-22 (×8): qty 1

## 2020-08-22 MED ORDER — ACETAMINOPHEN 325 MG PO TABS: 650.0000 mg | ORAL_TABLET | Freq: Four times a day (QID) | ORAL | Status: DC | PRN

## 2020-08-22 MED ORDER — NICOTINE 21 MG/24HR TD PT24: 21.0000 mg | MEDICATED_PATCH | Freq: Every day | TRANSDERMAL | 0 refills | Status: DC

## 2020-08-22 MED ORDER — TRAZODONE HCL 50 MG PO TABS: 50.0000 mg | ORAL_TABLET | Freq: Every evening | ORAL | Status: DC | PRN

## 2020-08-22 MED ORDER — ALUM & MAG HYDROXIDE-SIMETH 200-200-20 MG/5ML PO SUSP: 30.0000 mL | ORAL | Status: DC | PRN

## 2020-08-22 MED ORDER — ESCITALOPRAM OXALATE 10 MG PO TABS: 10.0000 mg | ORAL_TABLET | Freq: Every day | ORAL | Status: DC

## 2020-08-22 MED ORDER — QUETIAPINE FUMARATE 25 MG PO TABS
25.0000 mg | ORAL_TABLET | Freq: Every day | ORAL | Status: DC
  Administered 2020-08-22 – 2020-08-26 (×5): 25 mg via ORAL
  Filled 2020-08-22 (×7): qty 1

## 2020-08-22 MED ORDER — HYDROXYZINE HCL 25 MG PO TABS
25.0000 mg | ORAL_TABLET | Freq: Three times a day (TID) | ORAL | Status: DC | PRN
  Administered 2020-08-22 – 2020-08-27 (×12): 25 mg via ORAL
  Filled 2020-08-22 (×7): qty 1
  Filled 2020-08-22: qty 10
  Filled 2020-08-22 (×5): qty 1

## 2020-08-22 NOTE — Progress Notes (Addendum)
Gina Hendricks is a 60 year old arriving voluntarily and unaccompanied from [facility] after presenting with chief complaints of anxiety, depression, and suicidal ideation. Gina Hendricks also endorses feelings of paranoia, has been experiencing poor sleep (averaged 3-4 hours per night), and reports to have lost about 25 pounds in the last two months. When asked about stressors, patient shares that her Mother passed away on 09-14-2022. She reports that her Mother will be laid to rest tomorrow. She states that she lives alone, and identifies Gina Hendricks as her support person. When inquiring about family or others supports, she states that she has no one else. She declines to elaborate in regard the circumstances of her Mothers passing. She shares that she has not bathed and is fearful to do so. She is unable to speak to what triggers her anxiety about showering, though endorses that she has been afraid to do so for a "while". She shares that she has dealt with depression and anxiety for a long part of her life, and was on xanax for 35 years before stopping in February.   Gina Hendricks has started medications at the [facility]ina Hendricks and reports that she is in agreement to comply with medication regimen. She shares that she is a current every day smoker, and averages one pack per day. She has a nicotine patch on and would like to keep receiving this while here. Patient denies any food/drug allergies, though zoloft is listed as an allergy in the patient chart. She denies SI at the time of admission assessment and contracts for safety.   Skin assessment completed in presence of Female staff (NS). Skin is unremarkable with exception to noted flakiness/dryness to posterior aspect of the feet bilaterally. She also has patchy areas to her scalp. Patient states that she does not know what has caused the patchy areas to her scalp and has not sought treatment for this. She denies any itching, though endorses that she will pick at the areas at times. There is no  breaks to the skin, and no other abnormalities noted to the patients hair and scalp with exception to noted patches.   Gina Hendricks denies having a PCP, stating that she uninsured at present. Endorses prior mental health treatment though not currently connected with outpatient services. She denies wanting any information regarding advanced directives when offered. She declines animal assisted therapy consent, stating that she is not interested despite encouragement to consider in the event she might be open to pet therapy later in her stay. She denies any substance/alcohol use. She denies the flu or pneumonia vaccine when asked. Linens and toiletries provided. Items secured in patient lockers include one pair of carhartt boots. Other patient items are at the bedside (several items of clothing). Voluntary consents obtained and witnessed by this Clinical research associate. The patient remains safe at this time. Will continue to monitor.

## 2020-08-22 NOTE — Plan of Care (Signed)
  Problem: Education: Goal: Knowledge of [facility] Education information/materials will improve Outcome: Progressing   

## 2020-08-22 NOTE — ED Notes (Signed)
Pt sleeping at present, no distress noted, monitoring for safety. 

## 2020-08-22 NOTE — ED Notes (Signed)
Awake talking with DR off the unit. No new issues noted at this time. Will continue to monitor for safety

## 2020-08-22 NOTE — Discharge Instructions (Signed)
Transfer to [facility] 

## 2020-08-22 NOTE — ED Notes (Signed)
Report called to Lorin Glass, RN at [facility]. Called for safe transport

## 2020-08-22 NOTE — ED Provider Notes (Signed)
FBC/OBS ASAP Discharge Summary  Date and Time: 08/22/2020 4:17 PM  Name: Gina Hendricks  MRN:  [id number]   Discharge Diagnoses:  Final diagnoses:  MDD (major depressive disorder), recurrent, severe, with psychosis (HCC)  GAD (generalized anxiety disorder)    Subjective: Pt is seen and examined today. Pt states her mood is "ok". Pt rates her mood at 5/10 (10 is the best mood). Pt slept well last night.  Pt is anxious states when the thoughts come to her mind. She states she has paranoia and little things freaks her out. Pt states her appetite is fair.  Currently,Pt denies any suicidal ideation states she is not sure when the thoughts come in her mind, she doesn't feel safe. She cannot contract for safety at this time and interested in starting medication. She denies homicidal ideation and, visual and auditory hallucination. Pt took Paxil 20 mg For over 20 years but stopped 5 years ago. Pt doesn't want to start Paxil again states it doesn't help. Pt denies any headache, nausea, vomiting, dizziness, chest pain, SOB, abdominal pain, diarrhea, and constipation.  Pt denies any concerns.   Stay Summary: DAISSY Gina Hendricks is a 60y/o female. Patient presented voluntarily to [facility] accompanied by her friend Gina Hendricks who also participated in assessment at the request of patient. Patient presented with chief complaint of "suicidal thoughts, anxiety, worsening depression, and paranoia. Pt was admitted to the observation unit at [facility]. Pt was started on  Seroquel 25 mg nightly and was given 1 dose of Paxil.  Pt was reevaluated this morning. Today, she reported depressed mood and anxiety. She denied SI, HI and AVH. Pt cannot contract for safety at this time. Denied any side effects from medications. This morning Pt was started on Lexapro 10 mg daily. Pt agrees with that.  LDL- 102, WBC -14.9, TSH 1.442, Ethyl alc <10, Urine toxicology negative. Pt meets inpatient criteria and has been accepted to [facility] unit.  Attending Dr. Jola Babinski.    Total Time spent with patient: 20 minutes   Past Psychiatric History: See H&P Past Medical History:  Past Medical History:  Diagnosis Date  . Anxiety   . Depression   . Heart murmur     Past Surgical History:  Procedure Laterality Date  . FINGER SURGERY Right    right index finger   Family History:  Family History  Problem Relation Age of Onset  . Arthritis Mother   . Vision loss Mother   . Heart disease Father   . Alcohol abuse Brother   . Depression Brother   . Heart disease Maternal Grandmother   . Alcohol abuse Maternal Grandfather   . Heart disease Paternal Grandfather   . Alcohol abuse Brother    Family Psychiatric History: see H&P Social History:  Social History   Substance and Sexual Activity  Alcohol Use No     Social History   Substance and Sexual Activity  Drug Use No    Social History   Socioeconomic History  . Marital status: Single    Spouse name: Not on file  . Number of children: Not on file  . Years of education: Not on file  . Highest education level: Not on file  Occupational History  . Not on file  Tobacco Use  . Smoking status: Current Every Day Smoker    Packs/day: 1.00    Types: Cigarettes    Start date: 08/24/1980  . Smokeless tobacco: Never Used  Vaping Use  . Vaping Use: Never  used  Substance and Sexual Activity  . Alcohol use: No  . Drug use: No  . Sexual activity: Not Currently  Other Topics Concern  . Not on file  Social History Narrative  . Not on file   Social Determinants of Health   Financial Resource Strain: Not on file  Food Insecurity: Not on file  Transportation Needs: Not on file  Physical Activity: Not on file  Stress: Not on file  Social Connections: Not on file   SDOH:  SDOH Screenings   Alcohol Screen: Not on file  Depression (PHQ2-9): Medium Risk  . PHQ-2 Score: 17  Financial Resource Strain: Not on file  Food Insecurity: Not on file  Housing: Not on file  Physical  Activity: Not on file  Social Connections: Not on file  Stress: Not on file  Tobacco Use: High Risk  . Smoking Tobacco Use: Current Every Day Smoker  . Smokeless Tobacco Use: Never Used  Transportation Needs: Not on file    Has this patient used any form of tobacco in the last 30 days? (Cigarettes, Smokeless Tobacco, Cigars, and/or Pipes) yes, Prescription provided.   Current Medications:  Current Facility-Administered Medications  Medication Dose Route Frequency Provider Last Rate Last Admin  . acetaminophen (TYLENOL) tablet 650 mg  650 mg Oral Q6H PRN Ajibola, Ene A, NP      . alum & mag hydroxide-simeth (MAALOX/MYLANTA) 200-200-20 MG/5ML suspension 30 mL  30 mL Oral Q4H PRN Ajibola, Ene A, NP      . escitalopram (LEXAPRO) tablet 10 mg  10 mg Oral Daily Izmael Duross, MD      . hydrOXYzine (ATARAX/VISTARIL) tablet 25 mg  25 mg Oral TID PRN Ajibola, Ene A, NP      . magnesium hydroxide (MILK OF MAGNESIA) suspension 30 mL  30 mL Oral Daily PRN Ajibola, Ene A, NP      . nicotine (NICODERM CQ - dosed in mg/24 hours) patch 21 mg  21 mg Transdermal Q0600 Ajibola, Ene A, NP   21 mg at 08/22/20 1021  . QUEtiapine (SEROQUEL) tablet 25 mg  25 mg Oral QHS Ajibola, Ene A, NP   25 mg at 08/21/20 2359  . traZODone (DESYREL) tablet 50 mg  50 mg Oral QHS PRN Ajibola, Ene A, NP       Current Outpatient Medications  Medication Sig Dispense Refill  . escitalopram (LEXAPRO) 10 MG tablet Take 1 tablet (10 mg total) by mouth daily.    Melene Muller ON 08/23/2020] nicotine (NICODERM CQ - DOSED IN MG/24 HOURS) 21 mg/24hr patch Place 1 patch (21 mg total) onto the skin daily at 6 (six) AM. 28 patch 0  . QUEtiapine (SEROQUEL) 25 MG tablet Take 1 tablet (25 mg total) by mouth at bedtime.      PTA Medications: (Not in a hospital admission)   Musculoskeletal  Strength & Muscle Tone: within normal limits Gait & Station: normal Patient leans: N/A  Psychiatric Specialty Exam  Presentation  General Appearance:  Casual  Eye Contact:Minimal  Speech:Slow  Speech Volume:Decreased  Handedness:Right   Mood and Affect  Mood:Depressed; Anxious  Affect:Congruent   Thought Process  Thought Processes:Coherent  Descriptions of Associations:Intact  Orientation:Full (Time, Place and Person)  Thought Content:WDL  Diagnosis of Schizophrenia or Schizoaffective disorder in past: No    Hallucinations:Hallucinations: None  Ideas of Reference:Paranoia  Suicidal Thoughts:Suicidal Thoughts: No SI Passive Intent and/or Plan: With Plan; With Means to Carry Out  Homicidal Thoughts:Homicidal Thoughts: No   Sensorium  Memory:Immediate Good; Recent Good; Remote Good  Judgment:Intact  Insight:Fair   Executive Functions  Concentration:Fair  Attention Span:Fair  Recall:Fair  Fund of Knowledge:Fair  Language:Fair   Psychomotor Activity  Psychomotor Activity:Psychomotor Activity: Normal   Assets  Assets:Communication Skills; Desire for Improvement; Social Support; Transportation   Sleep  Sleep:Sleep: Fair   Nutritional Assessment (For OBS and [facility] admissions only) Has the patient had a weight loss or gain of 10 pounds or more in the last 3 months?: Yes Has the patient had a decrease in food intake/or appetite?: Yes Does the patient have dental problems?: No Does the patient have eating habits or behaviors that may be indicators of an eating disorder including binging or inducing vomiting?: No Has the patient recently lost weight without trying?: Yes, 2-13 lbs. Has the patient been eating poorly because of a decreased appetite?: Yes Malnutrition Screening Tool Score: 2    Physical Exam  Physical Exam Review of Systems  Constitutional: Negative.   HENT: Negative.   Eyes: Negative.   Respiratory: Negative.   Cardiovascular: Negative.   Gastrointestinal: Negative.   Genitourinary: Negative.   Musculoskeletal: Negative.   Skin: Negative.   Neurological: Negative.    Endo/Heme/Allergies: Negative.   Psychiatric/Behavioral: Positive for depression. Negative for hallucinations, memory loss, substance abuse and suicidal ideas. The patient is nervous/anxious and has insomnia.    Blood pressure 100/77, pulse 83, temperature 99.2 F (37.3 C), resp. rate 16, SpO2 99 %. There is no height or weight on file to calculate BMI.  Demographic Factors:  Living alone and Unemployed  Loss Factors: Loss of significant relationship  Historical Factors: NA  Risk Reduction Factors:   NA  Continued Clinical Symptoms:  Severe Anxiety and/or Agitation Depression:   Anhedonia Hopelessness Insomnia Previous Psychiatric Diagnoses and Treatments  Cognitive Features That Contribute To Risk:  None    Suicide Risk:  Mild:  Suicidal ideation of limited frequency, intensity, duration, and specificity.  There are no identifiable plans, no associated intent, mild dysphoria and related symptoms, good self-control (both objective and subjective assessment), few other risk factors, and identifiable protective factors, including available and accessible social support.  Plan Of Care/Follow-up recommendations:  Activity:  As Per PCP.  Disposition: Transfer to [facility].   Karsten Ro, MD 08/22/2020, 4:17 PM

## 2020-08-22 NOTE — ED Notes (Signed)
Pt escorted to retrieve belongings. Ambulated per self. No SI, HI, or AVH. No pain, discomfort, or acute distress noted. A&O x4. Escorted out back [place] to safe transport for transfer to [facility]. Stable at time of d/c

## 2020-08-22 NOTE — ED Notes (Signed)
Offered pt lunch. Pt declined.

## 2020-08-22 NOTE — ED Notes (Signed)
Pt resting at present, no distress noted, monitoring for safety. 

## 2020-08-22 NOTE — ED Notes (Signed)
BP re-check: 89/57. [name], LPN notified

## 2020-08-22 NOTE — ED Notes (Signed)
Pt given 2 nutri grain bars. 

## 2020-08-22 NOTE — Progress Notes (Signed)
Adult Psychoeducational Group Note  Date:  08/22/2020 Time:  9:02 PM  Group Topic/Focus:  Wrap-Up Group:   The focus of this group is to help patients review their daily goal of treatment and discuss progress on daily workbooks.  Participation Level:  Active  Participation Quality:  Appropriate  Affect:  Appropriate  Cognitive:  Appropriate  Insight: Appropriate   Engagement in Group:  Engaged  Modes of Intervention:  Discussion  Additional Comments:  Patient said her day was a 7. Her goal for today not to cry and she achieved her goal for today. Her coping skills walking.   Charna Busman Long 08/22/2020, 9:02 PM

## 2020-08-22 NOTE — Progress Notes (Signed)
Pt accepted to [facility] room 301-1.  Patient meets inpatient criteria per Reola Calkins, NP.   Dr. Jola Babinski is the attending provider.    Call report to [phone].  [name], RN @ [facility] notified.     Pt scheduled  to arrive at [facility] at 1700PM.  Signed:  Corky Crafts, MSW, [place], LCASA 08/22/2020 2:19 PM

## 2020-08-22 NOTE — ED Provider Notes (Addendum)
Behavioral Health Progress Note  Date and Time: 08/22/2020 8:47 AM Name: Gina Hendricks MRN:  [id number]  Subjective:  Pt is seen and examined today. Pt states her mood is "ok". Pt rates her mood at 5/10 (10 is the best mood). Pt slept well last night.  Pt is anxious states when the thoughts come to her mind. She states she has paranoia and little things freaks her out. Pt states her appetite is fair.  Currently, Pt denies any suicidal ideation states she is not sure when the thoughts come in her mind, she doesn't feel safe. She cannot contract for safety at this time and interested in starting medication. She denies homicidal ideation and, visual and auditory hallucination. Pt took Paxil 20 mg For over 20 years but stopped 5 years ago. Pt doesn't want to start Paxil gain. Pt denies any headache, nausea, vomiting, dizziness, chest pain, SOB, abdominal pain, diarrhea, and constipation.  Pt denies any concerns.  Pt meets inpatient criteria and waiting for placement in the inpatient unit.  LDL- 102, WBC -14.9, TSH 1.442, Ethyl alc <10, Urine toxicology negative.  Diagnosis:  Final diagnoses:  MDD (major depressive disorder), recurrent, severe, with psychosis (HCC)  GAD (generalized anxiety disorder)    Total Time spent with patient: 20 minutes  Past Psychiatric History: See H&P Past Medical History:  Past Medical History:  Diagnosis Date  . Anxiety   . Depression   . Heart murmur     Past Surgical History:  Procedure Laterality Date  . FINGER SURGERY Right    right index finger   Family History:  Family History  Problem Relation Age of Onset  . Arthritis Mother   . Vision loss Mother   . Heart disease Father   . Alcohol abuse Brother   . Depression Brother   . Heart disease Maternal Grandmother   . Alcohol abuse Maternal Grandfather   . Heart disease Paternal Grandfather   . Alcohol abuse Brother    Family Psychiatric  History: See H&P Social History:  Social History    Substance and Sexual Activity  Alcohol Use No     Social History   Substance and Sexual Activity  Drug Use No    Social History   Socioeconomic History  . Marital status: Single    Spouse name: Not on file  . Number of children: Not on file  . Years of education: Not on file  . Highest education level: Not on file  Occupational History  . Not on file  Tobacco Use  . Smoking status: Current Every Day Smoker    Packs/day: 1.00    Types: Cigarettes    Start date: 08/24/1980  . Smokeless tobacco: Never Used  Vaping Use  . Vaping Use: Never used  Substance and Sexual Activity  . Alcohol use: No  . Drug use: No  . Sexual activity: Not Currently  Other Topics Concern  . Not on file  Social History Narrative  . Not on file   Social Determinants of Health   Financial Resource Strain: Not on file  Food Insecurity: Not on file  Transportation Needs: Not on file  Physical Activity: Not on file  Stress: Not on file  Social Connections: Not on file   SDOH:  SDOH Screenings   Alcohol Screen: Not on file  Depression (PHQ2-9): Medium Risk  . PHQ-2 Score: 17  Financial Resource Strain: Not on file  Food Insecurity: Not on file  Housing: Not on file  Physical Activity:  Not on file  Social Connections: Not on file  Stress: Not on file  Tobacco Use: High Risk  . Smoking Tobacco Use: Current Every Day Smoker  . Smokeless Tobacco Use: Never Used  Transportation Needs: Not on file   Additional Social History:    Pain Medications: See MRA Prescriptions: See MRA Over the Counter: See MRA History of alcohol / drug use?: Yes Name of Substance 1: Xanxa 1 - Age of First Use: UTA 1 - Amount (size/oz): UTA 1 - Frequency: UTA 1 - Duration: UTA 1 - Last Use / Amount: UTA 1 - Method of Aquiring: UTA 1- Route of Use: UTA Name of Substance 2: cigeretteds 2 - Age of First Use: UTA 2 - Amount (size/oz): UTA 2 - Frequency: UTA 2 - Duration: UTA 2 - Last Use / Amount: UTA 2  - Method of Aquiring: UTA 2 - Route of Substance Use: UTA                Sleep: Good  Appetite:  Fair  Current Medications:  Current Facility-Administered Medications  Medication Dose Route Frequency Provider Last Rate Last Admin  . acetaminophen (TYLENOL) tablet 650 mg  650 mg Oral Q6H PRN Ajibola, Ene A, NP      . alum & mag hydroxide-simeth (MAALOX/MYLANTA) 200-200-20 MG/5ML suspension 30 mL  30 mL Oral Q4H PRN Ajibola, Ene A, NP      . hydrOXYzine (ATARAX/VISTARIL) tablet 25 mg  25 mg Oral TID PRN Ajibola, Ene A, NP      . magnesium hydroxide (MILK OF MAGNESIA) suspension 30 mL  30 mL Oral Daily PRN Ajibola, Ene A, NP      . nicotine (NICODERM CQ - dosed in mg/24 hours) patch 21 mg  21 mg Transdermal Q0600 Ajibola, Ene A, NP      . QUEtiapine (SEROQUEL) tablet 25 mg  25 mg Oral QHS Ajibola, Ene A, NP   25 mg at 08/21/20 2359  . traZODone (DESYREL) tablet 50 mg  50 mg Oral QHS PRN Ajibola, Ene A, NP       Current Outpatient Medications  Medication Sig Dispense Refill  . alprazolam (XANAX) 2 MG tablet Take 2 mg by mouth 3 (three) times daily. (Patient not taking: Reported on 08/22/2020)  0    Labs  Lab Results:  Admission on 08/21/2020  Component Date Value Ref Range Status  . SARS Coronavirus 2 by RT PCR 08/21/2020 NEGATIVE  NEGATIVE Final   Comment: (NOTE) SARS-CoV-2 target nucleic acids are NOT DETECTED.  The SARS-CoV-2 RNA is generally detectable in upper respiratory specimens during the acute phase of infection. The lowest concentration of SARS-CoV-2 viral copies this assay can detect is 138 copies/mL. A negative result does not preclude SARS-Cov-2 infection and should not be used as the sole basis for treatment or other patient management decisions. A negative result may occur with  improper specimen collection/handling, submission of specimen other than nasopharyngeal swab, presence of viral mutation(s) within the areas targeted by this assay, and inadequate  number of viral copies(<138 copies/mL). A negative result must be combined with clinical observations, patient history, and epidemiological information. The expected result is Negative.  Fact Sheet for Patients:  BloggerCourse.com  Fact Sheet for Healthcare Providers:  SeriousBroker.it  This test is no                          t yet approved or cleared by the [place] and  has been authorized for detection and/or diagnosis of SARS-CoV-2 by FDA under an Emergency Use Authorization (EUA). This EUA will remain  in effect (meaning this test can be used) for the duration of the COVID-19 declaration under Section 564(b)(1) of the Act, 21 U.S.C.section 360bbb-3(b)(1), unless the authorization is terminated  or revoked sooner.      . Influenza A by PCR 08/21/2020 NEGATIVE  NEGATIVE Final  . Influenza B by PCR 08/21/2020 NEGATIVE  NEGATIVE Final   Comment: (NOTE) The Xpert Xpress SARS-CoV-2/FLU/RSV plus assay is intended as an aid in the diagnosis of influenza from Nasopharyngeal swab specimens and should not be used as a sole basis for treatment. Nasal washings and aspirates are unacceptable for Xpert Xpress SARS-CoV-2/FLU/RSV testing.  Fact Sheet for Patients: BloggerCourse.com  Fact Sheet for Healthcare Providers: SeriousBroker.it  This test is not yet approved or cleared by the [place] FDA and has been authorized for detection and/or diagnosis of SARS-CoV-2 by FDA under an Emergency Use Authorization (EUA). This EUA will remain in effect (meaning this test can be used) for the duration of the COVID-19 declaration under Section 564(b)(1) of the Act, 21 U.S.C. section 360bbb-3(b)(1), unless the authorization is terminated or revoked.  Performed at [facility], [address], [place], [place] [address]   . WBC 08/21/2020 14.9* 4.0 - 10.5 K/uL Final  . RBC  08/21/2020 4.08  3.87 - 5.11 MIL/uL Final  . Hemoglobin 08/21/2020 13.1  12.0 - 15.0 g/dL Final  . HCT 63/84/6659 38.1  36.0 - 46.0 % Final  . MCV 08/21/2020 93.4  80.0 - 100.0 fL Final  . MCH 08/21/2020 32.1  26.0 - 34.0 pg Final  . MCHC 08/21/2020 34.4  30.0 - 36.0 g/dL Final  . RDW 93/57/0177 13.5  11.5 - 15.5 % Final  . Platelets 08/21/2020 243  150 - 400 K/uL Final  . nRBC 08/21/2020 0.0  0.0 - 0.2 % Final  . Neutrophils Relative % 08/21/2020 76  % Final  . Neutro Abs 08/21/2020 11.3* 1.7 - 7.7 K/uL Final  . Lymphocytes Relative 08/21/2020 16  % Final  . Lymphs Abs 08/21/2020 2.4  0.7 - 4.0 K/uL Final  . Monocytes Relative 08/21/2020 7  % Final  . Monocytes Absolute 08/21/2020 1.0  0.1 - 1.0 K/uL Final  . Eosinophils Relative 08/21/2020 1  % Final  . Eosinophils Absolute 08/21/2020 0.1  0.0 - 0.5 K/uL Final  . Basophils Relative 08/21/2020 0  % Final  . Basophils Absolute 08/21/2020 0.1  0.0 - 0.1 K/uL Final  . Immature Granulocytes 08/21/2020 0  % Final  . Abs Immature Granulocytes 08/21/2020 0.04  0.00 - 0.07 K/uL Final   Performed at [facility], [address], [place], [place] [address]  . Sodium 08/21/2020 138  135 - 145 mmol/L Final  . Potassium 08/21/2020 3.9  3.5 - 5.1 mmol/L Final  . Chloride 08/21/2020 108  98 - 111 mmol/L Final  . CO2 08/21/2020 23  22 - 32 mmol/L Final  . Glucose, Bld 08/21/2020 105* 70 - 99 mg/dL Final   Glucose reference range applies only to samples taken after fasting for at least 8 hours.  . BUN 08/21/2020 14  6 - 20 mg/dL Final  . Creatinine, Ser 08/21/2020 0.67  0.44 - 1.00 mg/dL Final  . Calcium 00/92/3300 9.5  8.9 - 10.3 mg/dL Final  . Total Protein 08/21/2020 6.7  6.5 - 8.1 g/dL Final  . Albumin 76/22/6333 3.6  3.5 -  5.0 g/dL Final  . AST 81/27/5170 19  15 - 41 U/L Final  . ALT 08/21/2020 15  0 - 44 U/L Final  . Alkaline Phosphatase 08/21/2020 87  38 - 126 U/L Final  . Total Bilirubin 08/21/2020 0.4  0.3 - 1.2 mg/dL Final  .  GFR, Estimated 08/21/2020 >60  >60 mL/min Final   Comment: (NOTE) Calculated using the CKD-EPI Creatinine Equation (2021)   . Anion gap 08/21/2020 7  5 - 15 Final   Performed at [facility], [address], [place], [place] [address]  . Hgb A1c MFr Bld 08/21/2020 5.5  4.8 - 5.6 % Final   Comment: (NOTE) Pre diabetes:          5.7%-6.4%  Diabetes:              >6.4%  Glycemic control for   <7.0% adults with diabetes   . Mean Plasma Glucose 08/21/2020 111.15  mg/dL Final   Performed at [facility], [address], [place], [place] [address]  . Alcohol, Ethyl (B) 08/21/2020 <10  <10 mg/dL Final   Comment: (NOTE) Lowest detectable limit for serum alcohol is 10 mg/dL.  For medical purposes only. Performed at [facility], [address], [place], [place] [address]   . TSH 08/21/2020 1.442  0.350 - 4.500 uIU/mL Final   Comment: Performed by a 3rd Generation assay with a functional sensitivity of <=0.01 uIU/mL. Performed at [facility], [address] [place], [place] [address]   . POC Amphetamine UR 08/21/2020 None Detected  NONE DETECTED (Cut Off Level 1000 ng/mL) Final  . POC Secobarbital (BAR) 08/21/2020 None Detected  NONE DETECTED (Cut Off Level 300 ng/mL) Final  . POC Buprenorphine (BUP) 08/21/2020 None Detected  NONE DETECTED (Cut Off Level 10 ng/mL) Final  . POC Oxazepam (BZO) 08/21/2020 None Detected  NONE DETECTED (Cut Off Level 300 ng/mL) Final  . POC Cocaine UR 08/21/2020 None Detected  NONE DETECTED (Cut Off Level 300 ng/mL) Final  . POC Methamphetamine UR 08/21/2020 None Detected  NONE DETECTED (Cut Off Level 1000 ng/mL) Final  . POC Morphine 08/21/2020 None Detected  NONE DETECTED (Cut Off Level 300 ng/mL) Final  . POC Oxycodone UR 08/21/2020 None Detected  NONE DETECTED (Cut Off Level 100 ng/mL) Final  . POC Methadone UR 08/21/2020 None Detected  NONE DETECTED (Cut Off Level 300 ng/mL) Final  . POC Marijuana UR 08/21/2020 None Detected  NONE  DETECTED (Cut Off Level 50 ng/mL) Final  . SARS Coronavirus 2 Ag 08/21/2020 NEGATIVE  NEGATIVE Final   Comment: (NOTE) SARS-CoV-2 antigen NOT DETECTED.   Negative results are presumptive.  Negative results do not preclude SARS-CoV-2 infection and should not be used as the sole basis for treatment or other patient management decisions, including infection  control decisions, particularly in the presence of clinical signs and  symptoms consistent with COVID-19, or in those who have been in contact with the virus.  Negative results must be combined with clinical observations, patient history, and epidemiological information. The expected result is Negative.  Fact Sheet for Patients: https://www.jennings-kim.com/  Fact Sheet for Healthcare Providers: https://alexander-rogers.biz/  This test is not yet approved or cleared by the [place] FDA and  has been authorized for detection and/or diagnosis of SARS-CoV-2 by FDA under an Emergency Use Authorization (EUA).  This EUA will remain in effect (meaning this test can be used) for the duration of  the COV  ID-19 declaration under Section 564(b)(1) of the Act, 21 U.S.C. section 360bbb-3(b)(1), unless the authorization is terminated or revoked sooner.    . Preg Test, Ur 08/21/2020 NEGATIVE  NEGATIVE Final   Comment:        THE SENSITIVITY OF THIS METHODOLOGY IS >24 mIU/mL   . Cholesterol 08/21/2020 157  0 - 200 mg/dL Final  . Triglycerides 08/21/2020 54  <150 mg/dL Final  . HDL 16/03/9603 44  >40 mg/dL Final  . Total CHOL/HDL Ratio 08/21/2020 3.6  RATIO Final  . VLDL 08/21/2020 11  0 - 40 mg/dL Final  . LDL Cholesterol 08/21/2020 102* 0 - 99 mg/dL Final   Comment:        Total Cholesterol/HDL:CHD Risk Coronary Heart Disease Risk Table                     Men   Women  1/2 Average Risk   3.4   3.3  Average Risk       5.0   4.4  2 X Average Risk   9.6   7.1  3 X Average Risk  23.4    11.0        Use the calculated Patient Ratio above and the CHD Risk Table to determine the patient's CHD Risk.        ATP III CLASSIFICATION (LDL):  <100     mg/dL   Optimal  540-981  mg/dL   Near or Above                    Optimal  130-159  mg/dL   Borderline  191-478  mg/dL   High  >295     mg/dL   Very High Performed at [facility], [address], [place], [place] [address]     Blood Alcohol level:  Lab Results  Component Value Date   [facility] <10 08/21/2020   ETH <10 02/04/2020    Metabolic Disorder Labs: Lab Results  Component Value Date   HGBA1C 5.5 08/21/2020   MPG 111.15 08/21/2020   No results found for: PROLACTIN Lab Results  Component Value Date   CHOL 157 08/21/2020   TRIG 54 08/21/2020   HDL 44 08/21/2020   CHOLHDL 3.6 08/21/2020   VLDL 11 08/21/2020   LDLCALC 102 (H) 08/21/2020   LDLCALC 177 (H) 11/30/2019    Therapeutic Lab Levels: No results found for: LITHIUM No results found for: VALPROATE No components found for:  CBMZ  Physical Findings   GAD-7   Flowsheet Row Office Visit from 11/30/2019 in [facility]  Total GAD-7 Score 8    PHQ2-9   Flowsheet Row ED from 02/04/2020 in [facility] EMERGENCY DEPARTMENT Office Visit from 11/30/2019 in [facility] Office Visit from 01/10/2018 in [facility] Office Visit from 08/09/2017 in [facility] Office Visit from 05/18/2017 in [facility]  PHQ-2 Total Score 0 0  PHQ-9 Total Score - -    Flowsheet Row ED from 08/21/2020 in [facility] ED from 02/04/2020 in [facility] EMERGENCY DEPARTMENT  C-SSRS RISK CATEGORY High Risk High Risk       Musculoskeletal  Strength & Muscle Tone: within normal limits Gait & Station: normal Patient leans: N/A  Psychiatric Specialty Exam  Presentation  General Appearance: Casual  Eye  Contact:Minimal  Speech:Slow  Speech Volume:Decreased  Handedness:Right   Mood and  Affect  Mood:Depressed; Anxious  Affect:Congruent   Thought Process  Thought Processes:Coherent  Descriptions of Associations:Intact  Orientation:Full (Time, Place and Person)  Thought Content:WDL  Diagnosis of Schizophrenia or Schizoaffective disorder in past: No    Hallucinations:No data recorded Ideas of Reference:Paranoia (More of generalized anxiety)  Suicidal Thoughts:Suicidal Thoughts: No SI Passive Intent and/or Plan: With Plan; With Means to Carry Out  Homicidal Thoughts:Homicidal Thoughts: No   Sensorium  Memory:Immediate Good; Recent Good; Remote Good  Judgment:Fair  Insight:Fair   Executive Functions  Concentration:Fair  Attention Span:Fair  Recall:Fair  Fund of Knowledge:Fair  Language:Fair   Psychomotor Activity  Psychomotor Activity:Psychomotor Activity: Normal   Assets  Assets:Communication Skills; Desire for Improvement; Housing; Social Support; Transportation   Sleep  Sleep:Sleep: Fair   Nutritional Assessment (For [facility] and [facility] only) Has the patient had a weight loss or gain of 10 pounds or more in the last 3 months?: Yes Has the patient had a decrease in food intake/or appetite?: Yes Does the patient have dental problems?: No Does the patient have eating habits or behaviors that may be indicators of an eating disorder including binging or inducing vomiting?: No Has the patient recently lost weight without trying?: Yes, 2-13 lbs. Has the patient been eating poorly because of a decreased appetite?: Yes Malnutrition Screening Tool Score: 2    Physical Exam  Physical Exam Vitals reviewed.  Constitutional:      General: She is not in acute distress.    Appearance: Normal appearance. She is not ill-appearing, toxic-appearing or diaphoretic.  HENT:     Head: Normocephalic and atraumatic.  Pulmonary:     Effort: Pulmonary effort  is normal.  Neurological:     General: No focal deficit present.     Mental Status: She is alert and oriented to person, place, and time.    Review of Systems  Constitutional: Negative.   HENT: Negative.   Eyes: Negative.   Respiratory: Negative.   Cardiovascular: Negative.   Gastrointestinal: Negative.   Genitourinary: Negative.   Musculoskeletal: Negative.   Skin: Negative.   Neurological: Negative.   Endo/Heme/Allergies: Negative.   Psychiatric/Behavioral: Positive for depression. Negative for hallucinations, memory loss, substance abuse and suicidal ideas. The patient is nervous/anxious. The patient does not have insomnia.    Blood pressure (!) 92/58, pulse 79, temperature 99.2 F (37.3 C), temperature source Oral, resp. rate 16, SpO2 99 %. There is no height or weight on file to calculate BMI.  Treatment Plan Summary:  Pt meets inpatient hospitalization criteria. Clinical research associateCannot contract for safety. Inpatient Mood disorder Bed placement request placed by CSW.   Daily contact with patient to assess and evaluate symptoms and progress in treatment -Monitor SI/HI/AVH. - Monitor for Side effects.  - Continue medications per orders. -Start Lexapro 10 mg Daily. - Waiting for inpatient placement.   Karsten RoVandana  Doda, MD 08/22/2020 8:47 AM

## 2020-08-22 NOTE — ED Notes (Signed)
Pt given salad and soda.

## 2020-08-23 ENCOUNTER — Telehealth (HOSPITAL_COMMUNITY): Payer: Self-pay | Admitting: Family Medicine

## 2020-08-23 ENCOUNTER — Encounter (HOSPITAL_COMMUNITY): Payer: Self-pay | Admitting: Psychiatry

## 2020-08-23 DIAGNOSIS — F419 Anxiety disorder, unspecified: Secondary | ICD-10-CM

## 2020-08-23 DIAGNOSIS — F411 Generalized anxiety disorder: Secondary | ICD-10-CM | POA: Diagnosis present

## 2020-08-23 HISTORY — DX: Anxiety disorder, unspecified: F41.9

## 2020-08-23 LAB — PROLACTIN: Prolactin: 5.2 ng/mL (ref 4.8–23.3)

## 2020-08-23 NOTE — Progress Notes (Signed)
Progress note    08/23/20 0753  Psych Admission Type (Psych Patients Only)  Admission Status Voluntary  Psychosocial Assessment  Patient Complaints Anxiety;Depression;Sadness;Worrying  Eye Contact Fair  Facial Expression Anxious;Sullen;Sad;Worried  Affect Anxious;Depressed;Sad;Sullen  Furniture conservator/restorer  Appearance/Hygiene Unremarkable  Behavior Characteristics Cooperative;Appropriate to situation;Anxious;Fidgety  Mood Depressed;Anxious;Sad;Sullen;Pleasant  Thought Administrator, sports thinking  Content Blaming self  Delusions Paranoid  Perception WDL  Hallucination None reported or observed  Judgment Poor  Confusion None  Danger to Self  Current suicidal ideation? Denies  Danger to Others  Danger to Others None reported or observed

## 2020-08-23 NOTE — [facility] Group Notes (Signed)
LCSW Group Therapy Note  08/23/2020   1430  Type of Therapy and Topic:  Group Therapy: What's So Funny About Mental Illness?  Participation Level:  Minimal   Description of Group:   In this group, patients learned how to recognize how they personally define their mental illness. This group also addressed stigma associated with mental illness.  Patients were asked to give examples of experiences surrounding living with a mental illness and how it makes them feel. Patients were asked to self-reflect on how they have chosen to address their mental health thus far and were invited to share those lessons or to discuss how stigma has affected their mental health care. Patients actively explore how their mental illness has impacted decisions and actions as well as how future decisions can be impacted.  Therapeutic Goals: 1. Patients will identify what can be considered mental illness 2. Patients will identify lessons learned from past experiences and how they can be applied to future struggles. 3. Patients will establish rapport with peers in a therapeutic setting.  Summary of Patient Progress: Pt shared during introductions that she was thankful for her 2 arms and legs. Pt watched the TedTalk but did not share during discussion.  Therapeutic Modalities:   Cognitive Behavioral Therapy    Felizardo Hoffmann, LCSW 08/23/2020  2:52 PM

## 2020-08-23 NOTE — Progress Notes (Signed)
   08/23/20 2233  Psych Admission Type (Psych Patients Only)  Admission Status Voluntary  Psychosocial Assessment  Patient Complaints Depression;Sadness  Eye Contact Fair  Facial Expression Flat;Sad  Affect Appropriate to circumstance;Sad;Preoccupied  Speech Logical/coherent  Interaction Minimal  Appearance/Hygiene Unremarkable  Behavior Characteristics Anxious;Fidgety  Mood Anxious;Depressed;Sad;Pleasant  Thought Process  Coherency WDL  Content Blaming self  Delusions None reported or observed  Perception WDL  Hallucination None reported or observed  Judgment WDL  Confusion WDL  Danger to Self  Current suicidal ideation? Passive (never directly answered the question so I feel she is still have some SI)  Danger to Others  Danger to Others None reported or observed  Patient very minimal with conversation but has a flat affect. It was told to MHT that her mother was buried today. Patient did not talk about it with undersigned. She admitted depression but never answered question about SI so I feel that she is still having some passive SI. She would talk about weather and other things but didn't want to talk about mother but says she says that she is still "beating herself up" about her situation. Staff offered support. Patient given her seroquel with a vistaril for anxiety. She states she doesn't like to take medication but knows that she needs it at this time. Did teach that these particular medication are not narcotics and are not addictive.

## 2020-08-23 NOTE — Tx Team (Addendum)
Interdisciplinary Treatment and Diagnostic Plan Update  08/23/2020 Time of Session: 9:05am [name] MRN: [id number]  Principal Diagnosis: <principal problem not specified>  Secondary Diagnoses: Active Problems:   MDD (major depressive disorder), recurrent, severe, with psychosis ([place])   Current Medications:  Current Facility-Administered Medications  Medication Dose Route Frequency Provider Last Rate Last Admin  . acetaminophen (TYLENOL) tablet 650 mg  650 mg Oral Q6H PRN Sharma Covert, MD      . alum & mag hydroxide-simeth (MAALOX/MYLANTA) 200-200-20 MG/5ML suspension 30 mL  30 mL Oral Q4H PRN Sharma Covert, MD      . escitalopram (LEXAPRO) tablet 10 mg  10 mg Oral Daily Sharma Covert, MD   10 mg at 08/23/20 4818  . hydrOXYzine (ATARAX/VISTARIL) tablet 25 mg  25 mg Oral TID PRN Sharma Covert, MD   25 mg at 08/23/20 0753  . magnesium hydroxide (MILK OF MAGNESIA) suspension 30 mL  30 mL Oral Daily PRN Sharma Covert, MD      . nicotine (NICODERM CQ - dosed in mg/24 hours) patch 21 mg  21 mg Transdermal Q0600 Sharma Covert, MD   21 mg at 08/23/20 0752  . QUEtiapine (SEROQUEL) tablet 25 mg  25 mg Oral QHS Sharma Covert, MD   25 mg at 08/22/20 2201  . traZODone (DESYREL) tablet 50 mg  50 mg Oral QHS PRN Sharma Covert, MD       PTA Medications: Medications Prior to Admission  Medication Sig Dispense Refill Last Dose  . escitalopram (LEXAPRO) 10 MG tablet Take 1 tablet (10 mg total) by mouth daily.     . nicotine (NICODERM CQ - DOSED IN MG/24 HOURS) 21 mg/24hr patch Place 1 patch (21 mg total) onto the skin daily at 6 (six) AM. 28 patch 0   . QUEtiapine (SEROQUEL) 25 MG tablet Take 1 tablet (25 mg total) by mouth at bedtime.       Patient Stressors:    Patient Strengths:    Treatment Modalities: Medication Management, Group therapy, Case management,  1 to 1 session with clinician, Psychoeducation, Recreational therapy.   Physician  Treatment Plan for Primary Diagnosis: <principal problem not specified> Long Term Goal(s):     Short Term Goals:    Medication Management: Evaluate patient's response, side effects, and tolerance of medication regimen.  Therapeutic Interventions: 1 to 1 sessions, Unit Group sessions and Medication administration.  Evaluation of Outcomes: Not Met  Physician Treatment Plan for Secondary Diagnosis: Active Problems:   MDD (major depressive disorder), recurrent, severe, with psychosis ([place])  Long Term Goal(s):     Short Term Goals:       Medication Management: Evaluate patient's response, side effects, and tolerance of medication regimen.  Therapeutic Interventions: 1 to 1 sessions, Unit Group sessions and Medication administration.  Evaluation of Outcomes: Not Met   RN Treatment Plan for Primary Diagnosis: <principal problem not specified> Long Term Goal(s): Knowledge of disease and therapeutic regimen to maintain health will improve  Short Term Goals: Ability to verbalize feelings will improve, Ability to identify and develop effective coping behaviors will improve and Compliance with prescribed medications will improve  Medication Management: RN will administer medications as ordered by provider, will assess and evaluate patient's response and provide education to patient for prescribed medication. RN will report any adverse and/or side effects to prescribing provider.  Therapeutic Interventions: 1 on 1 counseling sessions, Psychoeducation, Medication administration, Evaluate responses to treatment, Monitor vital signs and CBGs as  ordered, Perform/monitor CIWA, COWS, AIMS and Fall Risk screenings as ordered, Perform wound care treatments as ordered.  Evaluation of Outcomes: Not Met   LCSW Treatment Plan for Primary Diagnosis: <principal problem not specified> Long Term Goal(s): Safe transition to appropriate next level of care at discharge, Engage patient in therapeutic group  addressing interpersonal concerns.  Short Term Goals: Engage patient in aftercare planning with referrals and resources, Increase social support and Increase ability to appropriately verbalize feelings  Therapeutic Interventions: Assess for all discharge needs, 1 to 1 time with Social worker, Explore available resources and support systems, Assess for adequacy in community support network, Educate family and significant other(s) on suicide prevention, Complete Psychosocial Assessment, Interpersonal group therapy.  Evaluation of Outcomes: Not Met   Progress in Treatment: Attending groups: No. and As evidenced by:  Pt was recently admitted Participating in groups: No. and As evidenced by:  pt was recently admitted Taking medication as prescribed: Yes. Toleration medication: Yes. Family/Significant other contact made: No, will contact:  CSW will obtain consent Patient understands diagnosis: Yes. Discussing patient identified problems/goals with staff: Yes. Medical problems stabilized or resolved: Yes. Denies suicidal/homicidal ideation: Yes. Issues/concerns per patient self-inventory: No. Other: None  New problem(s) identified: No, Describe:  None  New Short Term/Long Term Goal(s):medication stabilization, elimination of SI thoughts, development of comprehensive mental wellness plan.  Patient Goals:  "be normal and that's all"  Discharge Plan or Barriers: Patient recently admitted. CSW will continue to follow and assess for appropriate referrals and possible discharge planning.  Reason for Continuation of Hospitalization: Depression Medication stabilization Suicidal ideation  Estimated Length of Stay: 3-5 days  Attendees: Patient: Gina Hendricks 08/23/2020   Physician: Myles Lipps, MD 08/23/2020   Nursing:  08/23/2020   RN Care Manager: 08/23/2020   Social Worker: Toney Reil, [place] 08/23/2020   Recreational Therapist:  08/23/2020   Other:  08/23/2020   Other:  08/23/2020   Other:  08/23/2020       Scribe for Treatment Team: Mliss Fritz, [place] 08/23/2020 10:03 AM

## 2020-08-23 NOTE — Progress Notes (Signed)
Pt shared that she had been taking xanax for 35 years and that her mother passed away on 10/03/2022. Pt endorses paranoia. She said that she is scared for some reason and hasn't been able to take a bath. Pt would like to return back to her normal functioning. She was present in the dayroom last night, although fidgety. She was administered her PRN vistaril which helped decrease in anxiety. Pt denies SI/HI and AVH. Active listening, reassurance, and support provided. Medications administered as ordered by provider. Q 15 min safety checks continue. Pt's safety has been maintained.   08/22/20 2201  Psych Admission Type (Psych Patients Only)  Admission Status Voluntary  Psychosocial Assessment  Patient Complaints Anxiety;Depression;Sadness;Worrying  Eye Contact Brief  Facial Expression Anxious  Affect Anxious;Appropriate to circumstance;Flat  Speech Logical/coherent  Interaction Minimal;Forwards little  Motor Activity Fidgety  Appearance/Hygiene Unremarkable  Behavior Characteristics Cooperative;Anxious;Fidgety;Guarded  Mood Depressed;Anxious;Sad  Thought Administrator, sports thinking  Content Paranoia  Delusions Paranoid  Perception WDL  Hallucination None reported or observed  Judgment Poor  Confusion Mild  Danger to Self  Current suicidal ideation? Denies  Danger to Others  Danger to Others None reported or observed

## 2020-08-23 NOTE — [facility] Suicide Risk Assessment (Signed)
[facility] Admission Suicide Risk Assessment   Nursing information obtained from:  Patient Demographic factors:  Living alone Current Mental Status:  Suicidal ideation indicated by patient Loss Factors:  Loss of significant relationship Historical Factors:  Family history of mental illness or substance abuse Risk Reduction Factors:  Sense of responsibility to family (Patient is able to identify one support person, Doran Durand.)  Total Time spent with patient: 30 minutes Principal Problem: Severe episode of recurrent major depressive disorder, without psychotic features ([facility]) Diagnosis:  Principal Problem:   Severe episode of recurrent major depressive disorder, without psychotic features ([facility]) Active Problems:   Anxiety disorder, unspecified  Subjective Data: See H&P  Continued Clinical Symptoms:  Alcohol Use Disorder Identification Test Final Score (AUDIT): 0 The "Alcohol Use Disorders Identification Test", Guidelines for Use in Primary Care, Second Edition.  World Science writer [facility]). Score between 0-7:  no or low risk or alcohol related problems. Score between 8-15:  moderate risk of alcohol related problems. Score between 16-19:  high risk of alcohol related problems. Score 20 or above:  warrants further diagnostic evaluation for alcohol dependence and treatment.   CLINICAL FACTORS:   Severe Anxiety and/or Agitation Depression:   Insomnia More than one psychiatric diagnosis Unstable or Poor Therapeutic Relationship Previous Psychiatric Diagnoses and Treatments   Musculoskeletal: Strength & Muscle Tone: within normal limits Gait & Station: normal Patient leans: N/A  Psychiatric Specialty Exam:  Presentation  General Appearance: Appropriate for Environment; N/A (unkempt)  Eye Contact:Fair  Speech:Clear and Coherent; Normal Rate  Speech Volume:Normal  Handedness:Right   Mood and Affect  Mood:Depressed; Anxious  Affect:Congruent; Constricted   Thought  Process  Thought Processes:Coherent; Linear  Descriptions of Associations:Intact  Orientation:Full (Time, Place and Person)  Thought Content:Logical  History of Schizophrenia/Schizoaffective disorder:No  Duration of Psychotic Symptoms:No data recorded Hallucinations:Hallucinations: None  Ideas of Reference:None  Suicidal Thoughts:Suicidal Thoughts: Yes, Passive SI Passive Intent and/or Plan: Without Intent; Without Plan  Homicidal Thoughts:Homicidal Thoughts: No   Sensorium  Memory:Immediate Good; Recent Good; Remote Good  Judgment:Fair  Insight:Fair   Executive Functions  Concentration:Fair  Attention Span:Fair  Recall:Fair  Fund of Knowledge:Fair  Language:Good   Psychomotor Activity  Psychomotor Activity:Psychomotor Activity: Normal   Assets  Assets:Desire for Improvement; Housing; Social Support; Physical Health   Sleep  Sleep:Sleep: Fair    Physical Exam: Physical Exam  Vitals and nursing note reviewed.  HENT:     Head: Normocephalic and atraumatic.  Pulmonary:     Effort: Pulmonary effort is normal.  Neurological:     General: No focal deficit present.     Mental Status: She is alert and oriented to person, place, and time.    ROS  Constitutional: Negative for chills and diaphoresis.  HENT: Negative for congestion and sore throat.   Respiratory: Negative for cough and shortness of breath.   Cardiovascular: Negative for chest pain.  Gastrointestinal: Negative for constipation, diarrhea, nausea and vomiting.  Genitourinary: Negative for dysuria.  Musculoskeletal: Negative for falls and myalgias.  Neurological: Negative for dizziness, tremors and seizures.  Psychiatric/Behavioral: Positive for depression and suicidal ideas. Negative for hallucinations. The patient is nervous/anxious.    Blood pressure 100/68, pulse (!) 124, temperature 98.8 F (37.1 C), temperature source Oral, resp. rate 20, SpO2 97 %. There is no height or weight on  file to calculate BMI.   COGNITIVE FEATURES THAT CONTRIBUTE TO RISK:  Thought constriction (tunnel vision)    SUICIDE RISK:   Moderate:  Frequent suicidal ideation with limited intensity,  and duration, some specificity in terms of plans, no associated intent, good self-control, limited dysphoria/symptomatology, some risk factors present, and identifiable protective factors, including available and accessible social support.  PLAN OF CARE: See H&P  I certify that inpatient services furnished can reasonably be expected to improve the patient's condition.   Claudie Revering, MD 08/23/2020, 1:03 PM

## 2020-08-23 NOTE — Progress Notes (Signed)
Recreation Therapy Notes  Date:  3.25.22 Time: 0930 Location: [address] Dayroom  Group Topic: Stress Management  Goal Area(s) Addresses:  Patient will identify positive stress management techniques. Patient will identify benefits of using stress management post d/c.  Behavioral Response: Engaged  Intervention: Stress Management  Activity: Meditation.  LRT played a meditation that focused on calming anxiety.  Patients were lead through a few breathing techniques to teach them how to calm themselves when feeling anxious.  Patients were also encouraged to let go of any thoughts or feelings that maybe holding them back.    Education:  Stress Management, Discharge Planning.   Education Outcome: Acknowledges Education  Clinical Observations/Feedback: Pt attended and participated.  Pt was anxiously shaking her legs throughout activity.     Caroll Rancher, LRT/CTRS     Lillia Abed, Marjette A 08/23/2020 11:03 AM

## 2020-08-23 NOTE — H&P (Signed)
Psychiatric Admission Assessment Adult  Patient Identification: Gina Hendricks MRN:  [id number] Date of Evaluation:  08/23/2020 Chief Complaint:  MDD (major depressive disorder), recurrent, severe, with psychosis (HCC) [F33.3] Principal Diagnosis: Severe episode of recurrent major depressive disorder, without psychotic features (HCC) Diagnosis:  Principal Problem:   Severe episode of recurrent major depressive disorder, without psychotic features (HCC) Active Problems:   Anxiety disorder, unspecified  Patient seen and interviewed.  Medical record reviewed.  Case discussed in detail with members of the treatment team.  History of Present Illness: Gina Hendricks is a 60 year old female with a prior history of major depressive disorder and alcohol abuse which is currently in not active but no prior inpatient psychiatric admissions who presented to the behavioral health center with a friend for evaluation of worsening depression, anxiety and suicidal thoughts.  The patient reported having suicidal ideation with a plan to overdose on her recently deceased mother's medications.  The patient was not taking any medications on admission having weaned herself off of Xanax with the last dose reportedly on July 12, 2020.  Prior to that time patient reports that she had been taking Xanax for approximately 30 years.  She reports her dose had been up to 2 mg 3 times daily but she slowly decreased her Xanax dose before discontinuing Xanax February 11.  (PDMP indicates that last Xanax prescription filled was for 2 mg tablets #90 on 04/05/2020.)  The patient was started on Lexapro 10 mg daily and quetiapine 25 mg at bedtime at the [facility] and was transferred to the behavioral [facility] for admission.  On interview today the patient states "I got really depressed and I cannot pull out of it.  I cannot smile, cannot laugh, cannot watch TV or function."  The patient reports having depressive  symptoms for more than a month which have worsened since her mother passed away 5 days ago.  Depressive symptoms include sad mood, trouble sleeping, decreased energy, problems concentrating, decreased interest, decreased appetite (with 20 pound weight loss in the last several months), feelings of worthlessness, withdrawal and wishes to die.  The patient reports having suicidal ideation with thoughts of taking an overdose on her mother's medication prior to admission but denies acting on these thoughts.  She denies any intent or plan to end her life today but continues to have wishes not to be alive.  She denies auditory or visual hallucinations, paranoia.  Patient states that she has been spending most of her time in the house pacing and smoking cigarettes.  She reports feeling edgy and experiencing significant worry.  The patient reports that the death of her mother has been a significant stressor.  Additionally she is unemployed.  The patient denies any recent alcohol or substance use.  She is willing to remain on Lexapro and quetiapine combination.  On exam she is thin with constricted affect and appears depressed and anxious.  She was tachycardic this morning with a heart rate of 124 standing but heart rate was 100 sitting.  Blood pressure was normal.  Associated Signs/Symptoms: Depression Symptoms:  depressed mood, anhedonia, insomnia, fatigue, difficulty concentrating, recurrent thoughts of death, suicidal thoughts without plan, anxiety, loss of energy/fatigue, disturbed sleep, weight loss, decreased appetite, Duration of Depression Symptoms: Greater than two weeks  (Hypo) Manic Symptoms:  none Anxiety Symptoms:  Excessive Worry, Psychotic Symptoms:  none PTSD Symptoms: Negative Total Time spent with patient: 30 minutes  Past Psychiatric History: Patient denies any prior history of inpatient psychiatric admissions.  She denies any prior suicide attempts.  She reports past medication  trials of Paxil 20 mg daily which she took for approximately 20 years for depression.  She stopped Paxil 5 years ago because she did not feel it was helping and could not afford it.  She also reports treatment previously with Xanax 2 mg 3 times a day.  She states that she has weaned herself off the Xanax and last took any Xanax July 12, 2020.  Last fill showing in PDMP dated 04/07/2020.  She denies having a current therapist or psychiatrist.   Is the patient at risk to self? Yes.    Has the patient been a risk to self in the past 6 months? Yes.    Has the patient been a risk to self within the distant past? No.  Is the patient a risk to others? No.  Has the patient been a risk to others in the past 6 months? No.  Has the patient been a risk to others within the distant past? No.   Prior Inpatient Therapy:   Prior Outpatient Therapy:    Alcohol Screening: 1. How often do you have a drink containing alcohol?: Never 2. How many drinks containing alcohol do you have on a typical day when you are drinking?: 1 or 2 3. How often do you have six or more drinks on one occasion?: Never AUDIT-C Score: 0 4. How often during the last year have you found that you were not able to stop drinking once you had started?: Never 5. How often during the last year have you failed to do what was normally expected from you because of drinking?: Never 6. How often during the last year have you needed a first drink in the morning to get yourself going after a heavy drinking session?: Never 7. How often during the last year have you had a feeling of guilt of remorse after drinking?: Never 8. How often during the last year have you been unable to remember what happened the night before because you had been drinking?: Never 9. Have you or someone else been injured as a result of your drinking?: No 10. Has a relative or friend or a doctor or another health worker been concerned about your drinking or suggested you cut  down?: No Alcohol Use Disorder Identification Test Final Score (AUDIT): 0 Substance Abuse History in the last 12 months:  No. Consequences of Substance Abuse: Negative NA Previous Psychotropic Medications: Yes  Psychological Evaluations: No  Past Medical History:  Past Medical History:  Diagnosis Date  . Anxiety   . Anxiety disorder, unspecified 08/23/2020  . Depression   . Heart murmur     Past Surgical History:  Procedure Laterality Date  . FINGER SURGERY Right    right index finger   Family History:  Family History  Problem Relation Age of Onset  . Arthritis Mother   . Vision loss Mother   . Heart disease Father   . Alcohol abuse Brother   . Depression Brother   . Heart disease Maternal Grandmother   . Alcohol abuse Maternal Grandfather   . Heart disease Paternal Grandfather   . Alcohol abuse Brother    Family Psychiatric  History: The patient denies any known history of mental health problems in family members.  She denies any family history of suicide.  She reports family history of alcohol problems. Tobacco Screening:   Social History:  Social History   Substance and Sexual Activity  Alcohol Use  No     Social History   Substance and Sexual Activity  Drug Use No    Additional Social History:                           Allergies:   Allergies  Allergen Reactions  . Zoloft [Sertraline Hcl] Other (See Comments)    "out of body experience"   Lab Results:  Results for orders placed or performed during the hospital encounter of 08/21/20 (from the past 48 hour(s))  Resp Panel by RT-PCR (Flu A&B, Covid) Nasopharyngeal Swab     Status: None   Collection Time: 08/21/20 10:59 PM   Specimen: Nasopharyngeal Swab; Nasopharyngeal(NP) swabs in vial transport medium  Result Value Ref Range   SARS Coronavirus 2 by RT PCR NEGATIVE NEGATIVE    Comment: (NOTE) SARS-CoV-2 target nucleic acids are NOT DETECTED.  The SARS-CoV-2 RNA is generally detectable in  upper respiratory specimens during the acute phase of infection. The lowest concentration of SARS-CoV-2 viral copies this assay can detect is 138 copies/mL. A negative result does not preclude SARS-Cov-2 infection and should not be used as the sole basis for treatment or other patient management decisions. A negative result may occur with  improper specimen collection/handling, submission of specimen other than nasopharyngeal swab, presence of viral mutation(s) within the areas targeted by this assay, and inadequate number of viral copies(<138 copies/mL). A negative result must be combined with clinical observations, patient history, and epidemiological information. The expected result is Negative.  Fact Sheet for Patients:  BloggerCourse.com  Fact Sheet for Healthcare Providers:  SeriousBroker.it  This test is no t yet approved or cleared by the [place] FDA and  has been authorized for detection and/or diagnosis of SARS-CoV-2 by FDA under an Emergency Use Authorization (EUA). This EUA will remain  in effect (meaning this test can be used) for the duration of the COVID-19 declaration under Section 564(b)(1) of the Act, 21 U.S.C.section 360bbb-3(b)(1), unless the authorization is terminated  or revoked sooner.       Influenza A by PCR NEGATIVE NEGATIVE   Influenza B by PCR NEGATIVE NEGATIVE    Comment: (NOTE) The Xpert Xpress SARS-CoV-2/FLU/RSV plus assay is intended as an aid in the diagnosis of influenza from Nasopharyngeal swab specimens and should not be used as a sole basis for treatment. Nasal washings and aspirates are unacceptable for Xpert Xpress SARS-CoV-2/FLU/RSV testing.  Fact Sheet for Patients: BloggerCourse.com  Fact Sheet for Healthcare Providers: SeriousBroker.it  This test is not yet approved or cleared by the [place] FDA and has been authorized  for detection and/or diagnosis of SARS-CoV-2 by FDA under an Emergency Use Authorization (EUA). This EUA will remain in effect (meaning this test can be used) for the duration of the COVID-19 declaration under Section 564(b)(1) of the Act, 21 U.S.C. section 360bbb-3(b)(1), unless the authorization is terminated or revoked.  Performed at [facility], [address], [place], [place] [address]   POCT Urine Drug Screen - (ICup)     Status: Normal   Collection Time: 08/21/20 11:08 PM  Result Value Ref Range   POC Amphetamine UR None Detected NONE DETECTED (Cut Off Level 1000 ng/mL)   POC Secobarbital (BAR) None Detected NONE DETECTED (Cut Off Level 300 ng/mL)   POC Buprenorphine (BUP) None Detected NONE DETECTED (Cut Off Level 10 ng/mL)   POC Oxazepam (BZO) None Detected NONE DETECTED (Cut Off Level 300 ng/mL)   POC Cocaine  UR None Detected NONE DETECTED (Cut Off Level 300 ng/mL)   POC Methamphetamine UR None Detected NONE DETECTED (Cut Off Level 1000 ng/mL)   POC Morphine None Detected NONE DETECTED (Cut Off Level 300 ng/mL)   POC Oxycodone UR None Detected NONE DETECTED (Cut Off Level 100 ng/mL)   POC Methadone UR None Detected NONE DETECTED (Cut Off Level 300 ng/mL)   POC Marijuana UR None Detected NONE DETECTED (Cut Off Level 50 ng/mL)  Pregnancy, urine POC     Status: None   Collection Time: 08/21/20 11:15 PM  Result Value Ref Range   Preg Test, Ur NEGATIVE NEGATIVE    Comment:        THE SENSITIVITY OF THIS METHODOLOGY IS >24 mIU/mL   CBC with Differential/Platelet     Status: Abnormal   Collection Time: 08/21/20 11:18 PM  Result Value Ref Range   WBC 14.9 (H) 4.0 - 10.5 K/uL   RBC 4.08 3.87 - 5.11 MIL/uL   Hemoglobin 13.1 12.0 - 15.0 g/dL   HCT 81.1 91.4 - 78.2 %   MCV 93.4 80.0 - 100.0 fL   MCH 32.1 26.0 - 34.0 pg   MCHC 34.4 30.0 - 36.0 g/dL   RDW 95.6 21.3 - 08.6 %   Platelets 243 150 - 400 K/uL   nRBC 0.0 0.0 - 0.2 %   Neutrophils Relative % 76 %   Neutro  Abs 11.3 (H) 1.7 - 7.7 K/uL   Lymphocytes Relative 16 %   Lymphs Abs 2.4 0.7 - 4.0 K/uL   Monocytes Relative 7 %   Monocytes Absolute 1.0 0.1 - 1.0 K/uL   Eosinophils Relative 1 %   Eosinophils Absolute 0.1 0.0 - 0.5 K/uL   Basophils Relative 0 %   Basophils Absolute 0.1 0.0 - 0.1 K/uL   Immature Granulocytes 0 %   Abs Immature Granulocytes 0.04 0.00 - 0.07 K/uL    Comment: Performed at [facility], [address], [place], [place] [address]  Comprehensive metabolic panel     Status: Abnormal   Collection Time: 08/21/20 11:18 PM  Result Value Ref Range   Sodium 138 135 - 145 mmol/L   Potassium 3.9 3.5 - 5.1 mmol/L   Chloride 108 98 - 111 mmol/L   CO2 23 22 - 32 mmol/L   Glucose, Bld 105 (H) 70 - 99 mg/dL    Comment: Glucose reference range applies only to samples taken after fasting for at least 8 hours.   BUN 14 6 - 20 mg/dL   Creatinine, Ser 9.62 0.44 - 1.00 mg/dL   Calcium 9.5 8.9 - 95.2 mg/dL   Total Protein 6.7 6.5 - 8.1 g/dL   Albumin 3.6 3.5 - 5.0 g/dL   AST 19 15 - 41 U/L   ALT 15 0 - 44 U/L   Alkaline Phosphatase 87 38 - 126 U/L   Total Bilirubin 0.4 0.3 - 1.2 mg/dL   GFR, Estimated >84 >13 mL/min    Comment: (NOTE) Calculated using the CKD-EPI Creatinine Equation (2021)    Anion gap 7 5 - 15    Comment: Performed at [facility], [address] [place], [place] [address]  Hemoglobin A1c     Status: None   Collection Time: 08/21/20 11:18 PM  Result Value Ref Range   Hgb A1c MFr Bld 5.5 4.8 - 5.6 %    Comment: (NOTE) Pre diabetes:          5.7%-6.4%  Diabetes:              >  6.4%  Glycemic control for   <7.0% adults with diabetes    Mean Plasma Glucose 111.15 mg/dL    Comment: Performed at [facility], [address], [place], [place] [address]  Ethanol     Status: None   Collection Time: 08/21/20 11:18 PM  Result Value Ref Range   Alcohol, Ethyl (B) <10 <10 mg/dL    Comment: (NOTE) Lowest detectable limit for serum alcohol is 10  mg/dL.  For medical purposes only. Performed at [facility], [address], [place], [place] [address]   TSH     Status: None   Collection Time: 08/21/20 11:18 PM  Result Value Ref Range   TSH 1.442 0.350 - 4.500 uIU/mL    Comment: Performed by a 3rd Generation assay with a functional sensitivity of <=0.01 uIU/mL. Performed at [facility], [address], [place], [place] [address]   Prolactin     Status: None   Collection Time: 08/21/20 11:18 PM  Result Value Ref Range   Prolactin 5.2 4.8 - 23.3 ng/mL    Comment: (NOTE) Performed At: [facility] [address] [place], [place] [id number] Jolene Schimke MD OZ:[id number]   Lipid panel     Status: Abnormal   Collection Time: 08/21/20 11:18 PM  Result Value Ref Range   Cholesterol 157 0 - 200 mg/dL   Triglycerides 54 <629 mg/dL   HDL 44 >52 mg/dL   Total CHOL/HDL Ratio 3.6 RATIO   VLDL 11 0 - 40 mg/dL   LDL Cholesterol 841 (H) 0 - 99 mg/dL    Comment:        Total Cholesterol/HDL:CHD Risk Coronary Heart Disease Risk Table                     Men   Women  1/2 Average Risk   3.4   3.3  Average Risk       5.0   4.4  2 X Average Risk   9.6   7.1  3 X Average Risk  23.4   11.0        Use the calculated Patient Ratio above and the CHD Risk Table to determine the patient's CHD Risk.        ATP III CLASSIFICATION (LDL):  <100     mg/dL   Optimal  324-401  mg/dL   Near or Above                    Optimal  130-159  mg/dL   Borderline  027-253  mg/dL   High  >664     mg/dL   Very High Performed at [facility], [address] [place], [place] [address]   POC SARS Coronavirus 2 Ag     Status: None   Collection Time: 08/21/20 11:19 PM  Result Value Ref Range   SARS Coronavirus 2 Ag NEGATIVE NEGATIVE    Comment: (NOTE) SARS-CoV-2 antigen NOT DETECTED.   Negative results are presumptive.  Negative results do not preclude SARS-CoV-2 infection and should not be used as the sole basis  for treatment or other patient management decisions, including infection  control decisions, particularly in the presence of clinical signs and  symptoms consistent with COVID-19, or in those who have been in contact with the virus.  Negative results must be combined with clinical observations, patient history, and epidemiological information. The expected result is Negative.  Fact Sheet for Patients: https://www.jennings-kim.com/  Fact  Sheet for Healthcare Providers: https://alexander-rogers.biz/https://www.fda.gov/media/141568/download  This test is not yet approved or cleared by the [place] FDA and  has been authorized for detection and/or diagnosis of SARS-CoV-2 by FDA under an Emergency Use Authorization (EUA).  This EUA will remain in effect (meaning this test can be used) for the duration of  the COV ID-19 declaration under Section 564(b)(1) of the Act, 21 U.S.C. section 360bbb-3(b)(1), unless the authorization is terminated or revoked sooner.      Blood Alcohol level:  Lab Results  Component Value Date   [facility] <10 08/21/2020   ETH <10 02/04/2020    Metabolic Disorder Labs:  Lab Results  Component Value Date   HGBA1C 5.5 08/21/2020   MPG 111.15 08/21/2020   Lab Results  Component Value Date   PROLACTIN 5.2 08/21/2020   Lab Results  Component Value Date   CHOL 157 08/21/2020   TRIG 54 08/21/2020   HDL 44 08/21/2020   CHOLHDL 3.6 08/21/2020   VLDL 11 08/21/2020   LDLCALC 102 (H) 08/21/2020   LDLCALC 177 (H) 11/30/2019    Current Medications: Current Facility-Administered Medications  Medication Dose Route Frequency Provider Last Rate Last Admin  . acetaminophen (TYLENOL) tablet 650 mg  650 mg Oral Q6H PRN Antonieta Pertlary, Greg Lawson, MD      . alum & mag hydroxide-simeth (MAALOX/MYLANTA) 200-200-20 MG/5ML suspension 30 mL  30 mL Oral Q4H PRN Antonieta Pertlary, Greg Lawson, MD      . escitalopram (LEXAPRO) tablet 10 mg  10 mg Oral Daily Antonieta Pertlary, Greg Lawson, MD   10 mg at 08/23/20 [id number]  .  hydrOXYzine (ATARAX/VISTARIL) tablet 25 mg  25 mg Oral TID PRN Antonieta Pertlary, Greg Lawson, MD   25 mg at 08/23/20 0753  . magnesium hydroxide (MILK OF MAGNESIA) suspension 30 mL  30 mL Oral Daily PRN Antonieta Pertlary, Greg Lawson, MD      . nicotine (NICODERM CQ - dosed in mg/24 hours) patch 21 mg  21 mg Transdermal Q0600 Antonieta Pertlary, Greg Lawson, MD   21 mg at 08/23/20 0752  . QUEtiapine (SEROQUEL) tablet 25 mg  25 mg Oral QHS Antonieta Pertlary, Greg Lawson, MD   25 mg at 08/22/20 2201  . traZODone (DESYREL) tablet 50 mg  50 mg Oral QHS PRN Antonieta Pertlary, Greg Lawson, MD       PTA Medications: Medications Prior to Admission  Medication Sig Dispense Refill Last Dose  . escitalopram (LEXAPRO) 10 MG tablet Take 1 tablet (10 mg total) by mouth daily.     . nicotine (NICODERM CQ - DOSED IN MG/24 HOURS) 21 mg/24hr patch Place 1 patch (21 mg total) onto the skin daily at 6 (six) AM. 28 patch 0   . QUEtiapine (SEROQUEL) 25 MG tablet Take 1 tablet (25 mg total) by mouth at bedtime.       Musculoskeletal: Strength & Muscle Tone: within normal limits Gait & Station: normal Patient leans: N/A            Psychiatric Specialty Exam:  Presentation  General Appearance: Appropriate for Environment; N/A (unkempt)  Eye Contact:Fair  Speech:Clear and Coherent; Normal Rate  Speech Volume:Normal  Handedness:Right   Mood and Affect  Mood:Depressed; Anxious  Affect:Congruent; Constricted   Thought Process  Thought Processes:Coherent; Linear  Duration of Psychotic Symptoms: No data recorded Past Diagnosis of Schizophrenia or Psychoactive disorder: No  Descriptions of Associations:Intact  Orientation:Full (Time, Place and Person)  Thought Content:Logical  Hallucinations:Hallucinations: None  Ideas of Reference:None  Suicidal Thoughts:Suicidal Thoughts: Yes, Passive SI Passive Intent and/or  Plan: Without Intent; Without Plan  Homicidal Thoughts:Homicidal Thoughts: No   Sensorium  Memory:Immediate Good; Recent Good;  Remote Good  Judgment:Fair  Insight:Fair   Executive Functions  Concentration:Fair  Attention Span:Fair  Recall:Fair  Fund of Knowledge:Fair  Language:Good   Psychomotor Activity  Psychomotor Activity:Psychomotor Activity: Normal   Assets  Assets:Desire for Improvement; Housing; Social Support; Physical Health   Sleep  Sleep:Sleep: Fair    Physical Exam: Physical Exam Vitals and nursing note reviewed.  HENT:     Head: Normocephalic and atraumatic.  Pulmonary:     Effort: Pulmonary effort is normal.  Neurological:     General: No focal deficit present.     Mental Status: She is alert and oriented to person, place, and time.    Review of Systems  Constitutional: Negative for chills and diaphoresis.  HENT: Negative for congestion and sore throat.   Respiratory: Negative for cough and shortness of breath.   Cardiovascular: Negative for chest pain.  Gastrointestinal: Negative for constipation, diarrhea, nausea and vomiting.  Genitourinary: Negative for dysuria.  Musculoskeletal: Negative for falls and myalgias.  Neurological: Negative for dizziness, tremors and seizures.  Psychiatric/Behavioral: Positive for depression and suicidal ideas. Negative for hallucinations. The patient is nervous/anxious.    Blood pressure 100/68, pulse (!) 124, temperature 98.8 F (37.1 C), temperature source Oral, resp. rate 20, SpO2 97 %. There is no height or weight on file to calculate BMI.  Treatment Plan Summary: Daily contact with patient to assess and evaluate symptoms and progress in treatment and Medication management  Observation Level/Precautions:  15 minute checks  Laboratory:  CBC Chemistry Profile HbAIC UDS lipid panel, TSH have been ordered.  Available lab results reviewed.  CMP is normal except for glucose of 105.  Lipid panel is normal except for LDL of 102.  CBC is normal except for elevated WBC of 14.9 and neutrophil count of 1130.  Pregnancy test is  negative.  TSH is WNL.  Urine drug screen was WNL hemoglobin A1c is 5.5.  We will repeat CBC given elevated WBC and neutrophil count.  Psychotherapy: Encourage participation in group therapy and therapeutic milieu  Medications: Continue Lexapro 10 mg daily for depression and anxiety.  Continue quetiapine 25 mg nightly for adjunct of treatment of mood and to help sleep.  Consultations:    Discharge Concerns:    Estimated LOS: 5 to 7 days  Other: Patient will need referral to outpatient psychotherapist and outpatient psychiatrist at discharge.   Physician Treatment Plan for Primary Diagnosis: Severe episode of recurrent major depressive disorder, without psychotic features (HCC) Long Term Goal(s): Improvement in symptoms so as ready for discharge  Short Term Goals: Ability to identify changes in lifestyle to reduce recurrence of condition will improve, Ability to verbalize feelings will improve, Ability to disclose and discuss suicidal ideas, Ability to demonstrate self-control will improve, Ability to identify and develop effective coping behaviors will improve, Ability to maintain clinical measurements within normal limits will improve and Ability to identify triggers associated with substance abuse/mental health issues will improve  Physician Treatment Plan for Secondary Diagnosis: Principal Problem:   Severe episode of recurrent major depressive disorder, without psychotic features (HCC) Active Problems:   Anxiety disorder, unspecified  Long Term Goal(s): Improvement in symptoms so as ready for discharge  Short Term Goals: Ability to identify changes in lifestyle to reduce recurrence of condition will improve, Ability to verbalize feelings will improve, Ability to disclose and discuss suicidal ideas, Ability to demonstrate self-control will improve, Ability  to identify and develop effective coping behaviors will improve, Ability to maintain clinical measurements within normal limits will  improve and Ability to identify triggers associated with substance abuse/mental health issues will improve  I certify that inpatient services furnished can reasonably be expected to improve the patient's condition.    Claudie Revering, MD 3/25/20221:02 PM

## 2020-08-23 NOTE — [facility] Suicide Risk Assessment (Signed)
[facility] INPATIENT:  Family/Significant Other Suicide Prevention Education  Suicide Prevention Education:  Patient Refusal for Family/Significant Other Suicide Prevention Education: The patient Gina Hendricks has refused to provide written consent for family/significant other to be provided Family/Significant Other Suicide Prevention Education during admission and/or prior to discharge.  Physician notified.  Felizardo Hoffmann 08/23/2020, 2:37 PM

## 2020-08-23 NOTE — BHH Counselor (Signed)
Adult Comprehensive Assessment  Patient ID: Gina Hendricks, female   DOB: December 03, 1960, 60 y.o.   MRN: [id number]  Information Source: Information source: Patient  Current Stressors:  Patient states their primary concerns and needs for treatment are:: "I just knew I needed some help." Patient states their goals for this hospitilization and ongoing recovery are:: "I would like to be me again." Housing / Lack of housing: Mother recently passed and soon will not have anywhere to live Bereavement / Loss: Mother passed 08/18/20  Living/Environment/Situation:  Living Arrangements: Parent Who else lives in the home?: Mother did but recently passed How long has patient lived in current situation?: since 2018 What is atmosphere in current home: Temporary,Comfortable  Family History:  Marital status: Single Are you sexually active?: No What is your sexual orientation?: "neither" Has your sexual activity been affected by drugs, alcohol, medication, or emotional stress?: UTA Does patient have children?: No  Childhood History:  By whom was/is the patient raised?: Both parents Description of patient's relationship with caregiver when they were a child: "wonderful" Patient's description of current relationship with people who raised him/her: Mother and father are both deceased How were you disciplined when you got in trouble as a child/adolescent?: "very well" Does patient have siblings?: Yes Number of Siblings: 3 Description of patient's current relationship with siblings: 2 brothers, 1 sister; "okay" Did patient suffer any verbal/emotional/physical/sexual abuse as a child?: No Did patient suffer from severe childhood neglect?: No Has patient ever been sexually abused/assaulted/raped as an adolescent or adult?: No Was the patient ever a victim of a crime or a disaster?: No Witnessed domestic violence?: No Has patient been affected by domestic violence as an adult?: No  Education:  Highest  grade of school patient has completed: Associates degree Currently a student?: No Learning disability?: No  Employment/Work Situation:   Employment situation: Unemployed Patient's job has been impacted by current illness: No Has patient ever been in the [employer]?: No  Financial Resources:   Surveyor, quantity resources: No income Does patient have a Lawyer or guardian?: No  Alcohol/Substance Abuse:   What has been your use of drugs/alcohol within the last 12 months?: None reported If attempted suicide, did drugs/alcohol play a role in this?: No Alcohol/Substance Abuse Treatment Hx: Denies past history Has alcohol/substance abuse ever caused legal problems?: Yes (couple of DUI's in 29 and 1995)  Social Support System:   Patient's Community Support System: Production assistant, radio System: "sister and bestfriend" Type of faith/religion: Baptist How does patient's faith help to cope with current illness?: "I used to"  Leisure/Recreation:   Do You Have Hobbies?: No  Strengths/Needs:   What is the patient's perception of their strengths?: "I used to be good at everything but it has all gone away."  Discharge Plan:   Currently receiving community mental health services: No Patient states concerns and preferences for aftercare planning are: interested in therapy and med management but transportation is a barrier as well as no internet access Patient states they will know when they are safe and ready for discharge when: "I want my mind to be different" Does patient have access to transportation?: Yes (someone will come at discharge) Does patient have financial barriers related to discharge medications?: Yes Patient description of barriers related to discharge medications: no income or insurance Will patient be returning to same living situation after discharge?: Yes (can go back to mother's home for a little while.)  Summary/Recommendations:  Gina Hendricks is a 60 year old  female who presented to [facility] for feeling increasingly depressed for the past month due to the death of her mother who died recently. Pt admitted "I want to hurt myself with either a knife or overdose on my pills"..While at [facility], pt would like to work on "feeling herself again.". Pt reports current stressors are the passing of her mother and not having anywhere to live in the future. Pt's highest level of education is an associates degree and  Pt is currently unemployed. Pt reports no drug and alcohol use. Pt describes their support system as Good and states her sister and best friend are a part of it. Pt currently sees no outpatient providers. Pt will live at her mother's home when they discharge and will be picked up by "a friend". While here, Gina Hendricks can benefit from crisis stabilization, medication management, therapeutic milieu, and referrals for services.    Felizardo Hoffmann. 08/23/2020

## 2020-08-23 NOTE — BH Assessment (Signed)
Care Management - Follow Up [facility] Discharges  ° °Patient has been placed in an inpatient psychiatric hospital ([facility]).  °

## 2020-08-23 NOTE — Plan of Care (Signed)
  Problem: Education: Goal: Utilization of techniques to improve thought processes will improve Outcome: Progressing   Problem: Health Behavior/Discharge Planning: Goal: Ability to make decisions will improve Outcome: Progressing Goal: Compliance with therapeutic regimen will improve Outcome: Progressing   

## 2020-08-24 DIAGNOSIS — F332 Major depressive disorder, recurrent severe without psychotic features: Secondary | ICD-10-CM | POA: Diagnosis not present

## 2020-08-24 LAB — CBC WITH DIFFERENTIAL/PLATELET
Abs Immature Granulocytes: 0.04 10*3/uL (ref 0.00–0.07)
Basophils Absolute: 0.1 10*3/uL (ref 0.0–0.1)
Basophils Relative: 1 %
Eosinophils Absolute: 0.1 10*3/uL (ref 0.0–0.5)
Eosinophils Relative: 1 %
HCT: 40.3 % (ref 36.0–46.0)
Hemoglobin: 12.9 g/dL (ref 12.0–15.0)
Immature Granulocytes: 0 %
Lymphocytes Relative: 20 %
Lymphs Abs: 2.3 10*3/uL (ref 0.7–4.0)
MCH: 31.9 pg (ref 26.0–34.0)
MCHC: 32 g/dL (ref 30.0–36.0)
MCV: 99.8 fL (ref 80.0–100.0)
Monocytes Absolute: 1 10*3/uL (ref 0.1–1.0)
Monocytes Relative: 9 %
Neutro Abs: 8.1 10*3/uL — ABNORMAL HIGH (ref 1.7–7.7)
Neutrophils Relative %: 69 %
Platelets: 251 10*3/uL (ref 150–400)
RBC: 4.04 MIL/uL (ref 3.87–5.11)
RDW: 13.6 % (ref 11.5–15.5)
WBC: 11.6 10*3/uL — ABNORMAL HIGH (ref 4.0–10.5)
nRBC: 0 % (ref 0.0–0.2)

## 2020-08-24 NOTE — BHH Group Notes (Signed)
LCSW Group Therapy Note  08/24/2020   10:00-11:00am   Type of Therapy and Topic:  Group Therapy: Anger Cues and Responses  Participation Level:  Active   Description of Group:   In this group, patients learned how to recognize the physical, cognitive, emotional, and behavioral responses they have to anger-provoking situations.  They identified a recent time they became angry and how they reacted.  They analyzed how their reaction was possibly beneficial and how it was possibly unhelpful.  The group discussed a variety of healthier coping skills that could help with such a situation in the future.  Focus was placed on how helpful it is to recognize the underlying emotions to our anger, because working on those can lead to a more permanent solution as.  Group discussion also focused on the way in which assuming you know what other people's intentions are can lead [place] to react inappropriately, but also how difficult it is to assume the best of others because of past hurts.  Therapeutic Goals: 1. Patients will remember their last incident of anger and how they felt emotionally and physically, what their thoughts were at the time, and how they behaved. 2. Patients will identify how their behavior at that time worked for them, as well as how it worked against them. 3. Patients will explore possible new behaviors to use in future anger situations. 4. Patients will learn that anger itself is normal and cannot be eliminated, and that healthier reactions can assist with resolving conflict rather than worsening situations.  Summary of Patient Progress:  The patient shared that her most recent time of anger is current and said her anger is completely self-directed.  She later talked about the primary emotions fueling her anger, specifically naming shame and guilt.  She said "I don't know what to do."  She was attentive throughout group even when not talking, and she appeared sad with little eye  contact.  Therapeutic Modalities:   Cognitive Behavioral Therapy  Lynnell Chad
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
## 2020-08-24 NOTE — Progress Notes (Signed)
°   08/24/20 2328  Psych Admission Type (Psych Patients Only)  Admission Status Voluntary  Psychosocial Assessment  Patient Complaints Depression  Eye Contact Fair  Facial Expression Flat  Affect Appropriate to circumstance  Speech Logical/coherent  Interaction Minimal  Motor Activity Other (Comment) (WDL)  Appearance/Hygiene Unremarkable  Behavior Characteristics Appropriate to situation  Mood Anxious;Pleasant  Thought Process  Coherency WDL  Content WDL  Delusions None reported or observed  Perception WDL  Hallucination None reported or observed  Judgment WDL  Confusion None  Danger to Self  Current suicidal ideation? Denies  Self-Injurious Behavior No self-injurious ideation or behavior indicators observed or expressed   Agreement Not to Harm Self Yes  Description of Agreement verbal  Danger to Others  Danger to Others None reported or observed

## 2020-08-24 NOTE — Progress Notes (Signed)
°   08/24/20 2323  COVID-19 Daily Checkoff  Have you had a fever (temp > 37.80C/100F)  in the past 24 hours?  No  If you have had runny nose, nasal congestion, sneezing in the past 24 hours, has it worsened? No  COVID-19 EXPOSURE  Have you traveled outside the state in the past 14 days? No  Have you been in contact with someone with a confirmed diagnosis of COVID-19 or PUI in the past 14 days without wearing appropriate PPE? No  Have you been living in the same home as a person with confirmed diagnosis of COVID-19 or a PUI (household contact)? No  Have you been diagnosed with COVID-19? No

## 2020-08-24 NOTE — BHH Group Notes (Signed)
.  Psychoeducational Group Note  Date: 08-24-2020 Time: 0900-1000    Goal Setting   Purpose of Group: This group helps to provide patients with the steps of setting a goal that is specific, measurable, attainable, realistic and time specific. A discussion on how we keep ourselves stuck with negative self talk.    Participation Level:  Active  Participation Quality:  Appropriate  Affect:  Appropriate  Cognitive:  Appropriate  Insight:  Improving  Engagement in Group:  Engaged  Additional Comments:  Pt rates her energy at a 6/10. Pt was quiet in the group, but did share when asked to.   Dione Housekeeper

## 2020-08-24 NOTE — Progress Notes (Signed)
[facility] MD Progress Note  08/24/2020 12:17 PM Gina Hendricks  MRN:  [id number]  Subjective: Gina Hendricks reports, "I'm doing better today".  Objective: Gina Hendricks is a 60 year old female with a prior history of major depressive disorder and alcohol abuse which is currently in not active but no prior inpatient psychiatric admissions who presented to the behavioral health center with a friend for evaluation of worsening depression, anxiety and suicidal thoughts.  The patient reported having suicidal ideation with a plan to overdose on her recently deceased mother's medications.  The patient was not taking any medications on admission having weaned herself off of Xanax with the last dose reportedly on July 12, 2020.    Daily notes: Gina Hendricks is seen, chart reviewed. The chart findings discussed with the treatment team. She presents alert & oriented x 4. She is visible on the unit, attending group sessions. She says she came to the hospital because she needed help with her mental health. She says she was so depressed that she was not taking care of herself. She says she recently lost her mother who has been her support system. She reports that she was not on any medications prior to admission & she was not sleeping well at all. She says she has started medications here to help, but it is too early for her to tell if the medicines are effective or not. She says she slept well last night. She denies any side effects from her medications. She denies any SIHI, AVH, delusional thoughts or paranoia. Gina Hendricks does not appear to be responding to any internal stimuli. She is in agreement to continue her current plan of care as already in progress.   Principal Problem: Severe episode of recurrent major depressive disorder, without psychotic features (HCC)  Diagnosis: Principal Problem:   Severe episode of recurrent major depressive disorder, without psychotic features (HCC) Active Problems:   Anxiety disorder,  unspecified  Total Time spent with patient: 25 minutes  Past Psychiatric History: See H&P  Past Medical History:  Past Medical History:  Diagnosis Date  . Anxiety   . Anxiety disorder, unspecified 08/23/2020  . Depression   . Heart murmur     Past Surgical History:  Procedure Laterality Date  . FINGER SURGERY Right    right index finger   Family History:  Family History  Problem Relation Age of Onset  . Arthritis Mother   . Vision loss Mother   . Heart disease Father   . Alcohol abuse Brother   . Depression Brother   . Heart disease Maternal Grandmother   . Alcohol abuse Maternal Grandfather   . Heart disease Paternal Grandfather   . Alcohol abuse Brother    Family Psychiatric  History: See H&P  Social History:  Social History   Substance and Sexual Activity  Alcohol Use No     Social History   Substance and Sexual Activity  Drug Use No    Social History   Socioeconomic History  . Marital status: Single    Spouse name: Not on file  . Number of children: Not on file  . Years of education: Not on file  . Highest education level: Not on file  Occupational History  . Not on file  Tobacco Use  . Smoking status: Current Every Day Smoker    Packs/day: 1.00    Types: Cigarettes    Start date: 08/24/1980  . Smokeless tobacco: Never Used  Vaping Use  . Vaping Use: Never used  Substance and Sexual  Activity  . Alcohol use: No  . Drug use: No  . Sexual activity: Not Currently  Other Topics Concern  . Not on file  Social History Narrative  . Not on file   Social Determinants of Health   Financial Resource Strain: Not on file  Food Insecurity: Not on file  Transportation Needs: Not on file  Physical Activity: Not on file  Stress: Not on file  Social Connections: Not on file   Additional Social History:   Sleep: Good  Appetite:  Good  Current Medications: Current Facility-Administered Medications  Medication Dose Route Frequency Provider Last  Rate Last Admin  . acetaminophen (TYLENOL) tablet 650 mg  650 mg Oral Q6H PRN Antonieta Pert, MD      . alum & mag hydroxide-simeth (MAALOX/MYLANTA) 200-200-20 MG/5ML suspension 30 mL  30 mL Oral Q4H PRN Antonieta Pert, MD      . escitalopram (LEXAPRO) tablet 10 mg  10 mg Oral Daily Antonieta Pert, MD   10 mg at 08/24/20 0755  . hydrOXYzine (ATARAX/VISTARIL) tablet 25 mg  25 mg Oral TID PRN Antonieta Pert, MD   25 mg at 08/24/20 0755  . magnesium hydroxide (MILK OF MAGNESIA) suspension 30 mL  30 mL Oral Daily PRN Antonieta Pert, MD      . nicotine (NICODERM CQ - dosed in mg/24 hours) patch 21 mg  21 mg Transdermal Q0600 Antonieta Pert, MD   21 mg at 08/24/20 0753  . QUEtiapine (SEROQUEL) tablet 25 mg  25 mg Oral QHS Antonieta Pert, MD   25 mg at 08/23/20 2152  . traZODone (DESYREL) tablet 50 mg  50 mg Oral QHS PRN Antonieta Pert, MD       Lab Results: No results found for this or any previous visit (from the past 48 hour(s)).  Blood Alcohol level:  Lab Results  Component Value Date   ETH <10 08/21/2020   ETH <10 02/04/2020   Metabolic Disorder Labs: Lab Results  Component Value Date   HGBA1C 5.5 08/21/2020   MPG 111.15 08/21/2020   Lab Results  Component Value Date   PROLACTIN 5.2 08/21/2020   Lab Results  Component Value Date   CHOL 157 08/21/2020   TRIG 54 08/21/2020   HDL 44 08/21/2020   CHOLHDL 3.6 08/21/2020   VLDL 11 08/21/2020   LDLCALC 102 (H) 08/21/2020   LDLCALC 177 (H) 11/30/2019   Physical Findings: AIMS: Facial and Oral Movements Muscles of Facial Expression: None, normal Lips and Perioral Area: None, normal Jaw: None, normal Tongue: None, normal,Extremity Movements Upper (arms, wrists, hands, fingers): None, normal Lower (legs, knees, ankles, toes): None, normal, Trunk Movements Neck, shoulders, hips: None, normal, Overall Severity Severity of abnormal movements (highest score from questions above): None,  normal Incapacitation due to abnormal movements: None, normal Patient's awareness of abnormal movements (rate only patient's report): No Awareness, Dental Status Current problems with teeth and/or dentures?: No Does patient usually wear dentures?: No  CIWA:    COWS:     Musculoskeletal: Strength & Muscle Tone: within normal limits Gait & Station: normal Patient leans: N/A  Psychiatric Specialty Exam:  Presentation  General Appearance: Appropriate for Environment; N/A (unkempt)  Eye Contact:Good  Speech:Clear and Coherent; Normal Rate  Speech Volume:Normal  Handedness:Right  Mood and Affect  Mood:Depressed (But, I'm feeling a little better than I was".)  Affect:Flat (But reactive)   Thought Process  Thought Processes:Coherent; Linear  Descriptions of Associations:Intact  Orientation:Full (Time,  Place and Person)  Thought Content:Logical  History of Schizophrenia/Schizoaffective disorder:No  Duration of Psychotic Symptoms:No data recorded Hallucinations:Hallucinations: None  Ideas of Reference:None  Suicidal Thoughts:Suicidal Thoughts: No SI Passive Intent and/or Plan: Without Intent; Without Plan; Without Means to Carry Out  Homicidal Thoughts:Homicidal Thoughts: No  Sensorium  Memory:Immediate Good; Recent Good; Remote Good  Judgment:Fair  Insight:Good  Executive Functions  Concentration:Good  Attention Span:Good  Recall:Good  Fund of Knowledge:Fair  Language:Good  Psychomotor Activity  Psychomotor Activity:Psychomotor Activity: Normal  Assets  Assets:Desire for Improvement; Housing; Social Support; Physical Health  Sleep  Sleep:Sleep: Good  Physical Exam: Physical Exam Vitals and nursing note reviewed.  HENT:     Head: Normocephalic.     Nose: Nose normal.     Mouth/Throat:     Pharynx: Oropharynx is clear.  Eyes:     Pupils: Pupils are equal, round, and reactive to light.  Cardiovascular:     Rate and Rhythm: Normal rate.      Pulses: Normal pulses.  Pulmonary:     Effort: Pulmonary effort is normal.  Genitourinary:    Comments: Deferred Musculoskeletal:        General: Normal range of motion.     Cervical back: Normal range of motion.  Skin:    General: Skin is warm and dry.  Neurological:     General: No focal deficit present.     Mental Status: She is alert and oriented to person, place, and time.    Review of Systems  Constitutional: Negative for chills and fever.  HENT: Negative for congestion and sore throat.   Eyes: Negative for blurred vision.  Respiratory: Negative for cough and shortness of breath.   Cardiovascular: Negative for chest pain and palpitations.  Gastrointestinal: Negative for abdominal pain, constipation, diarrhea, heartburn, nausea and vomiting.  Genitourinary: Negative for dysuria.  Musculoskeletal: Negative for joint pain and myalgias.  Neurological: Negative for dizziness, tingling, tremors, sensory change, speech change, focal weakness, seizures, loss of consciousness, weakness and headaches.  Endo/Heme/Allergies:       Allergies: Sertraline (Zoloft).  Psychiatric/Behavioral: Positive for depression ("Improving") and substance abuse (Hx. Benzodiazepine use). Negative for hallucinations, memory loss and suicidal ideas. The patient is not nervous/anxious and does not have insomnia.    Blood pressure 95/67, pulse 69, temperature 98.1 F (36.7 C), temperature source Oral, resp. rate 20, SpO2 99 %. There is no height or weight on file to calculate BMI.  Treatment Plan Summary: Daily contact with patient to assess and evaluate symptoms and progress in treatment and Medication management.  Continue inpatient hospitalization. Will continue today 08/24/2020 plan as below except where it is noted.  Depression. Continue Lexapro 10 mg po daily..  Anxiety Continue Vistaril 25 mg po tid prn.  Insomnia. Continue Trazodone 50 mg po Q hs prn. Continue Seroquel 25 mg po at bedtime  for agitation.  Continue Nicotine patch 21 mg topically Q 24 hrs for nicotine withdrawal.  Other prn medications: Continue,  Acetaminophen 650 mg po Q 4 hrs prn for pain/fever. Mylanta 30 ml po Q 4 hrs prn for indigestion. MOM 30 ml po Q daily prn for constipation.  Encourage group participation. Discharge disposition plan in progress.  Armandina Stammer, NP, PMHNP, FNP-BC 08/24/2020, 12:17 PM

## 2020-08-24 NOTE — Progress Notes (Signed)
Patient presents with a flat affect and depressed mood. She rates depression 6/10, and anxiety 6/10. She reported feeling anxious and requested to take Vistaril. Patient denies SI/HI. She reports fair sleep at night. She has been visible on the unit and attends groups.   Orders reviewed. Vital signs reviewed. Verbal support provided. 15 minute checks performed for safety.   Patient compliant with treatment plan and denies any medication side effects.

## 2020-08-25 DIAGNOSIS — F332 Major depressive disorder, recurrent severe without psychotic features: Secondary | ICD-10-CM | POA: Diagnosis not present

## 2020-08-25 NOTE — Progress Notes (Signed)
[facility] MD Progress Note  08/25/2020 2:04 PM MIGDALIA OLEJNICZAK  MRN:  [id number]  Subjective: Brystol reports, "I'm doing better. My mood is good. I think I ready to go home".  Objective: Clydene Burack is a 60 year old female with a prior history of major depressive disorder and alcohol abuse which is currently in not active but no prior inpatient psychiatric admissions who presented to the behavioral health center with a friend for evaluation of worsening depression, anxiety and suicidal thoughts.  The patient reported having suicidal ideation with a plan to overdose on her recently deceased mother's medications.  The patient was not taking any medications on admission having weaned herself off of Xanax with the last dose reportedly on July 12, 2020.    Daily notes: Brieanna is seen, chart reviewed. The chart findings discussed with the treatment team. She presents alert & oriented x 4. She is visible on the unit, attending group sessions. She says she is doing better. Reports improved mood. Says she is ready for discharge. She says she slept well last night. She denies any side effects from her medications. She denies any SIHI, AVH, delusional thoughts or paranoia. Tamila does not appear to be responding to any internal stimuli. She is in agreement to continue her current plan of care as already in progress. She wants to be discharged in the morning.  Principal Problem: Severe episode of recurrent major depressive disorder, without psychotic features (HCC)  Diagnosis: Principal Problem:   Severe episode of recurrent major depressive disorder, without psychotic features (HCC) Active Problems:   Anxiety disorder, unspecified  Total Time spent with patient: 25 minutes  Past Psychiatric History: See H&P  Past Medical History:  Past Medical History:  Diagnosis Date  . Anxiety   . Anxiety disorder, unspecified 08/23/2020  . Depression   . Heart murmur     Past Surgical History:  Procedure Laterality  Date  . FINGER SURGERY Right    right index finger   Family History:  Family History  Problem Relation Age of Onset  . Arthritis Mother   . Vision loss Mother   . Heart disease Father   . Alcohol abuse Brother   . Depression Brother   . Heart disease Maternal Grandmother   . Alcohol abuse Maternal Grandfather   . Heart disease Paternal Grandfather   . Alcohol abuse Brother    Family Psychiatric  History: See H&P  Social History:  Social History   Substance and Sexual Activity  Alcohol Use No     Social History   Substance and Sexual Activity  Drug Use No    Social History   Socioeconomic History  . Marital status: Single    Spouse name: Not on file  . Number of children: Not on file  . Years of education: Not on file  . Highest education level: Not on file  Occupational History  . Not on file  Tobacco Use  . Smoking status: Current Every Day Smoker    Packs/day: 1.00    Types: Cigarettes    Start date: 08/24/1980  . Smokeless tobacco: Never Used  Vaping Use  . Vaping Use: Never used  Substance and Sexual Activity  . Alcohol use: No  . Drug use: No  . Sexual activity: Not Currently  Other Topics Concern  . Not on file  Social History Narrative  . Not on file   Social Determinants of Health   Financial Resource Strain: Not on file  Food Insecurity: Not on file  Transportation Needs: Not on file  Physical Activity: Not on file  Stress: Not on file  Social Connections: Not on file   Additional Social History:   Sleep: Good  Appetite:  Good  Current Medications: Current Facility-Administered Medications  Medication Dose Route Frequency Provider Last Rate Last Admin  . acetaminophen (TYLENOL) tablet 650 mg  650 mg Oral Q6H PRN Antonieta Pert, MD      . alum & mag hydroxide-simeth (MAALOX/MYLANTA) 200-200-20 MG/5ML suspension 30 mL  30 mL Oral Q4H PRN Antonieta Pert, MD      . escitalopram (LEXAPRO) tablet 10 mg  10 mg Oral Daily Antonieta Pert, MD   10 mg at 08/25/20 0258  . hydrOXYzine (ATARAX/VISTARIL) tablet 25 mg  25 mg Oral TID PRN Antonieta Pert, MD   25 mg at 08/25/20 0744  . magnesium hydroxide (MILK OF MAGNESIA) suspension 30 mL  30 mL Oral Daily PRN Antonieta Pert, MD      . nicotine (NICODERM CQ - dosed in mg/24 hours) patch 21 mg  21 mg Transdermal Q0600 Antonieta Pert, MD   21 mg at 08/25/20 0747  . QUEtiapine (SEROQUEL) tablet 25 mg  25 mg Oral QHS Antonieta Pert, MD   25 mg at 08/24/20 2135  . traZODone (DESYREL) tablet 50 mg  50 mg Oral QHS PRN Antonieta Pert, MD       Lab Results:  Results for orders placed or performed during the hospital encounter of 08/22/20 (from the past 48 hour(s))  CBC with Differential/Platelet     Status: Abnormal   Collection Time: 08/24/20  6:17 PM  Result Value Ref Range   WBC 11.6 (H) 4.0 - 10.5 K/uL   RBC 4.04 3.87 - 5.11 MIL/uL   Hemoglobin 12.9 12.0 - 15.0 g/dL   HCT 52.7 78.2 - 42.3 %   MCV 99.8 80.0 - 100.0 fL   MCH 31.9 26.0 - 34.0 pg   MCHC 32.0 30.0 - 36.0 g/dL   RDW 53.6 14.4 - 31.5 %   Platelets 251 150 - 400 K/uL   nRBC 0.0 0.0 - 0.2 %   Neutrophils Relative % 69 %   Neutro Abs 8.1 (H) 1.7 - 7.7 K/uL   Lymphocytes Relative 20 %   Lymphs Abs 2.3 0.7 - 4.0 K/uL   Monocytes Relative 9 %   Monocytes Absolute 1.0 0.1 - 1.0 K/uL   Eosinophils Relative 1 %   Eosinophils Absolute 0.1 0.0 - 0.5 K/uL   Basophils Relative 1 %   Basophils Absolute 0.1 0.0 - 0.1 K/uL   Immature Granulocytes 0 %   Abs Immature Granulocytes 0.04 0.00 - 0.07 K/uL    Comment: Performed at [facility], [address] [place], [place] [address]    Blood Alcohol level:  Lab Results  Component Value Date   [facility] <10 08/21/2020   ETH <10 02/04/2020   Metabolic Disorder Labs: Lab Results  Component Value Date   HGBA1C 5.5 08/21/2020   MPG 111.15 08/21/2020   Lab Results  Component Value Date   PROLACTIN 5.2 08/21/2020   Lab Results   Component Value Date   CHOL 157 08/21/2020   TRIG 54 08/21/2020   HDL 44 08/21/2020   CHOLHDL 3.6 08/21/2020   VLDL 11 08/21/2020   LDLCALC 102 (H) 08/21/2020   LDLCALC 177 (H) 11/30/2019   Physical Findings: AIMS: Facial and Oral Movements Muscles of Facial Expression: None, normal Lips and Perioral  Area: None, normal Jaw: None, normal Tongue: None, normal,Extremity Movements Upper (arms, wrists, hands, fingers): None, normal Lower (legs, knees, ankles, toes): None, normal, Trunk Movements Neck, shoulders, hips: None, normal, Overall Severity Severity of abnormal movements (highest score from questions above): None, normal Incapacitation due to abnormal movements: None, normal Patient's awareness of abnormal movements (rate only patient's report): No Awareness, Dental Status Current problems with teeth and/or dentures?: No Does patient usually wear dentures?: No  CIWA:    COWS:     Musculoskeletal: Strength & Muscle Tone: within normal limits Gait & Station: normal Patient leans: N/A  Psychiatric Specialty Exam:  Presentation  General Appearance: Appropriate for Environment; N/A (unkempt)  Eye Contact:Good  Speech:Clear and Coherent; Normal Rate  Speech Volume:Normal  Handedness:Right  Mood and Affect  Mood: Euthymic  Affect: Good.  Thought Process  Thought Processes:Coherent; Linear  Descriptions of Associations:Intact  Orientation:Full (Time, Place and Person)  Thought Content:Logical  History of Schizophrenia/Schizoaffective disorder:No  Duration of Psychotic Symptoms: NA Hallucinations: None  Ideas of Reference:None  Suicidal Thoughts: Denies  Homicidal Thoughts: Denies  Sensorium  Memory:Immediate Good; Recent Good; Remote Good  Judgment: Good  Insight:Good  Executive Functions  Concentration:Good  Attention Span:Good  Recall:Good  Fund of Knowledge:Fair  Language:Good  Psychomotor Activity  Psychomotor Activity:  Normal.  Assets  Assets:Desire for Improvement; Housing; Social Support; Physical Health  Sleep  Sleep: Good.  Physical Exam: Physical Exam Vitals and nursing note reviewed.  HENT:     Head: Normocephalic.     Nose: Nose normal.     Mouth/Throat:     Pharynx: Oropharynx is clear.  Eyes:     Pupils: Pupils are equal, round, and reactive to light.  Cardiovascular:     Rate and Rhythm: Normal rate.     Pulses: Normal pulses.  Pulmonary:     Effort: Pulmonary effort is normal.  Genitourinary:    Comments: Deferred Musculoskeletal:        General: Normal range of motion.     Cervical back: Normal range of motion.  Skin:    General: Skin is warm and dry.  Neurological:     General: No focal deficit present.     Mental Status: She is alert and oriented to person, place, and time.    Review of Systems  Constitutional: Negative for chills and fever.  HENT: Negative for congestion and sore throat.   Eyes: Negative for blurred vision.  Respiratory: Negative for cough and shortness of breath.   Cardiovascular: Negative for chest pain and palpitations.  Gastrointestinal: Negative for abdominal pain, constipation, diarrhea, heartburn, nausea and vomiting.  Genitourinary: Negative for dysuria.  Musculoskeletal: Negative for joint pain and myalgias.  Neurological: Negative for dizziness, tingling, tremors, sensory change, speech change, focal weakness, seizures, loss of consciousness, weakness and headaches.  Endo/Heme/Allergies:       Allergies: Sertraline (Zoloft).  Psychiatric/Behavioral: Positive for depression ("Improving") and substance abuse (Hx. Benzodiazepine use). Negative for hallucinations, memory loss and suicidal ideas. The patient is not nervous/anxious and does not have insomnia.    Blood pressure 100/82, pulse (!) 103, temperature 98.7 F (37.1 C), temperature source Oral, resp. rate 20, SpO2 99 %. There is no height or weight on file to calculate BMI.  Treatment  Plan Summary: Daily contact with patient to assess and evaluate symptoms and progress in treatment and Medication management.  Continue inpatient hospitalization. Will continue today 08/25/2020 plan as below except where it is noted.  Depression. Continue Lexapro 10 mg po daily.Marland Kitchen  Anxiety Continue Vistaril 25 mg po tid prn.  Insomnia. Continue Trazodone 50 mg po Q hs prn. Continue Seroquel 25 mg po at bedtime for agitation.  Continue Nicotine patch 21 mg topically Q 24 hrs for nicotine withdrawal.  Other prn medications: Continue,  Acetaminophen 650 mg po Q 4 hrs prn for pain/fever. Mylanta 30 ml po Q 4 hrs prn for indigestion. MOM 30 ml po Q daily prn for constipation.  Encourage group participation. Discharge disposition plan in progress.  Armandina StammerAgnes Gurfateh Mcclain, NP, PMHNP, FNP-BC 08/25/2020, 2:04 PMPatient ID: Patrcia Dollyonya D Kitts, female   DOB: [birth year], 60 y.o.   MRN: [id number]

## 2020-08-25 NOTE — Progress Notes (Signed)
Adult Psychoeducational Group Note  Date:  08/25/2020 Time:  8:41 PM  Group Topic/Focus:  Wrap-Up Group:   The focus of this group is to help patients review their daily goal of treatment and discuss progress on daily workbooks.  Participation Level:  Active  Participation Quality:  Appropriate  Affect:  Appropriate  Cognitive:  Appropriate  Insight: Appropriate  Engagement in Group:  Engaged  Modes of Intervention:  Discussion  Additional Comments:  Patient said her day was 8. Her goal for today is to cope. And she achieved her goal. Her coping skills live in the present.  Gina Hendricks 08/25/2020, 8:41 PM

## 2020-08-25 NOTE — [facility] Group Notes (Signed)
Adult Psychoeducational Group Not Date:  08/25/2020 Time:  5277-8242 Group Topic/Focus: PROGRESSIVE RELAXATION. A group where deep breathing is taught and tensing and relaxation muscle groups is used. Imagery is used as well.  Pts are asked to imagine 3 pillars that hold them up when they are not able to hold themselves up.  Participation Level:  Active  Participation Quality:  Appropriate  Affect:  Appropriate  Cognitive:  Oriented  Insight: Improving  Engagement in Group:  Engaged  Modes of Intervention:  Activity, Discussion, Education, and Support  Additional Comments:  Rates her energy at a 7/10. Could not come up with any pillars.  Dione Housekeeper

## 2020-08-25 NOTE — Progress Notes (Signed)
°   08/25/20 2152  Psych Admission Type (Psych Patients Only)  Admission Status Voluntary  Psychosocial Assessment  Patient Complaints Anxiety  Eye Contact Fair  Facial Expression Flat  Affect Appropriate to circumstance  Speech Logical/coherent  Interaction Minimal  Motor Activity Other (Comment) (WDL)  Appearance/Hygiene Improved  Behavior Characteristics Appropriate to situation  Mood Depressed;Pleasant  Thought Process  Coherency WDL  Content WDL  Delusions None reported or observed  Perception WDL  Hallucination None reported or observed  Judgment WDL  Confusion None  Danger to Self  Current suicidal ideation? Denies  Self-Injurious Behavior No self-injurious ideation or behavior indicators observed or expressed   Agreement Not to Harm Self Yes  Description of Agreement verbal  Danger to Others  Danger to Others None reported or observed

## 2020-08-25 NOTE — [facility] Group Notes (Signed)
Psychoeducational Group Note    Date:08/25/2020 Time: 1300-1400    Life Skills:  A group where two lists are made. What people need and what are things that we do that are healthy. The lists are developed by the patients and it is explained that we often do the actions that are not healthy to get our list of needs met.   Purpose of Group: . The group focus' on teaching patients on how to identify their needs and how to develop the coping skills needed to get their needs met  Participation Level:  Active  Participation Quality:  Appropriate  Affect:  Appropriate  Cognitive:  Oriented  Insight:  Improving  Engagement in Group:  Engaged  Additional Comments:  Pt  Rates her energy at a 7/10.  Pt participated fully   Gina Hendricks

## 2020-08-25 NOTE — Progress Notes (Signed)
D:  Patient denied SI and HI, contracts for safety.  Denied A/V hallucinations.  Denied pain. A:  Medications administered per MD orders.  Emotional support and encouragement given patient. R:  Safety maintained with 15 minute checks.  

## 2020-08-25 NOTE — Progress Notes (Signed)
   08/25/20 2148  COVID-19 Daily Checkoff  Have you had a fever (temp > 37.80C/100F)  in the past 24 hours?  No  If you have had runny nose, nasal congestion, sneezing in the past 24 hours, has it worsened? No  COVID-19 EXPOSURE  Have you traveled outside the state in the past 14 days? No  Have you been in contact with someone with a confirmed diagnosis of COVID-19 or PUI in the past 14 days without wearing appropriate PPE? No  Have you been living in the same home as a person with confirmed diagnosis of COVID-19 or a PUI (household contact)? No  Have you been diagnosed with COVID-19? No

## 2020-08-25 NOTE — [facility] Group Notes (Signed)
[facility] LCSW Group Therapy Note  08/25/2020    Type of Therapy and Topic:  Group Therapy:  Adding Supports Including Yourself  Participation Level:  Minimal   Description of Group:   Patients in this group were introduced to the concept that additional supports including self-support are an essential part of recovery.  Patients listed their current healthy and unhealthy supports, and discussed the difference between the two.   Several songs were played and a group discussion ensued in which patients stated they could relate to the songs which inspired them to realize they have be willing to help themselves in order to succeed, because other people cannot achieve sobriety or stability for them.  Parents were encouraged toward self-advocacy and self-support as part of their recovery.  They discussed their reactions to these songs' messages, which were positive and hopeful.  Before group ended, they identified the supports they believe they need to add to their lives to achieve their goals at discharge.   Therapeutic Goals: 1)  explain the difference between healthy and unhealthy supports and discuss what specific supports are currently in patients' lives 2)  demonstrate the importance of being a key part of one's own support system 3)  discuss the need for appropriate boundaries with supports 4)  elicit ideas from patients about supports that need to be added in order to achieve goals   Summary of Patient Progress:   The patient listed current healthy supports as her sister and current unhealthy supports as her brothers.  Prior to the end of group, patient expressed that supports she needs to add at discharge include therapy.    Therapeutic Modalities:   Motivational Interviewing Activity  Lynnell Chad

## 2020-08-25 NOTE — Plan of Care (Signed)
Nurse discussed coping skills and anxiety with patient.  

## 2020-08-26 MED ORDER — ENSURE ENLIVE PO LIQD
237.0000 mL | Freq: Two times a day (BID) | ORAL | Status: DC
  Filled 2020-08-26 (×4): qty 237

## 2020-08-26 NOTE — [facility] Group Notes (Signed)
Occupational Therapy Group Note Date: 08/26/2020 Group Topic/Focus: Stress Management  Group Description: Group encouraged increased participation and engagement through discussion focused on topic of stress management. Patients engaged interactively to discuss components of stress including physical signs, emotional signs, negative management strategies, and positive management strategies. Each individual identified one new stress management strategy they would like to try moving forward.    Therapeutic Goals: Identify current stressors Identify healthy vs unhealthy stress management strategies/techniques Discuss and identify physical and emotional signs of stress Participation Level: Minimal   Participation Quality: Minimal Cues   Behavior: Guarded and Withdrawn   Speech/Thought Process: Distracted   Affect/Mood: Anxious and Constricted   Insight: Limited   Judgement: Limited   Individualization: Gina Hendricks was minimally engaged in their participation of group discussion/activity. Pt introduced themselves and shared that they were hungry and waiting for snack, however otherwise was not involved in group discussion. Pt did not identify anything specific that was stressful to them, however remained alert and attentive to peers contributions.   Modes of Intervention: Activity, Discussion and Education  Patient Response to Interventions:  Attentive   Plan: Continue to engage patient in OT groups 2 - 3x/week.  08/26/2020  Donne Hazel, MOT, OTR/L

## 2020-08-26 NOTE — Progress Notes (Signed)
The patient rated her day as an 8 out of 10. Patient credited her day with the fact that, "I made it". Her goal for tomorrow is to go home.

## 2020-08-26 NOTE — Progress Notes (Addendum)
   08/26/20 2000  Psych Admission Type (Psych Patients Only)  Admission Status Voluntary  Psychosocial Assessment  Patient Complaints Anxiety;Depression  Eye Contact Fair  Facial Expression Flat  Affect Appropriate to circumstance  Speech Logical/coherent  Interaction Minimal  Motor Activity Slow  Appearance/Hygiene Unremarkable  Behavior Characteristics Cooperative;Appropriate to situation  Mood Depressed;Anxious;Pleasant  Thought Process  Coherency WDL  Content WDL  Delusions None reported or observed  Perception WDL  Hallucination None reported or observed  Judgment WDL  Confusion None  Danger to Self  Current suicidal ideation? Denies  Danger to Others  Danger to Others None reported or observed   Denies SI, HI, AVH and pain. Rates anxiety 4/10 and depression 4/10. Pt says she's ready for discharge.

## 2020-08-26 NOTE — Progress Notes (Signed)
Recreation Therapy Notes  Date:  3.28.22 Time: 0930 Location: [address] Dayroom  Group Topic: Stress Management  Goal Area(s) Addresses:  Patient will identify positive stress management techniques. Patient will identify benefits of using stress management post d/c.  Behavioral Response:  Engaged  Intervention: Stress Management  Activity: Meditation.  LRT played a meditation that got the patients to take inventory of how their bodies and any tension or pain they may be feeling and try ease by focusing and using breathing techniques.  Patients were to focus as the meditation played to fully engage in the activity.    Education:  Stress Management, Discharge Planning.   Education Outcome: Acknowledges Education  Clinical Observations/Feedback: Pt attended and participated in group session.    Caroll Rancher, LRT/CTRS    Caroll Rancher A 08/26/2020 11:44 AM

## 2020-08-26 NOTE — Progress Notes (Signed)
[facility] MD Progress Note  08/26/2020 1:32 PM Gina Hendricks  MRN:  [id number]   Patient seen and interviewed.  Medical record reviewed.  Case discussed in detail with members of the treatment team.  Gina Hendricks is a 60 year old female with a prior history of major depressive disorder and alcohol abuse which is currently in not active but no prior inpatient psychiatric admissions who presented to the behavioral health center with a friend for evaluation of worsening depression, anxiety and suicidal thoughts.    Subjective: Interview today the patient reports her mood is "better."  When asked to describe she states "my frame of mind is better."  She rates her depression as 4 out of 10 in intensity and anxiety as 5 out of 10 in intensity.  The patient denies passive wish for death or active suicidal ideation intent preparation or plan.  She denies thoughts of engaging in nonsuicidal self-injurious behavior.  She reports mild anxiety.  Patient states that she feels she is back to her old self.  She denies any side effects from the medication.  She reports that she has had several bumps on her scalp for the past 2 months.  They are nonpruritic but she has been picking at them.  She also reports that she has a slight cough but denies other physical problems.  The patient states her medications are working well and she would like to go home.  Per review of chart the patient's sister has stated that patient cannot return to where she was living before.  It is unclear whether patient has any transportation to get to and from her appointments.  The patient has stated to several staff members that she does not think she will take her medication after discharge.  Been participating in groups.  She is not engaged in any self-injurious behavior or any aggressive behavior on the unit.  On interview with me today, the patient looks a bit brighter and less depressed.  She continues to appear depressed overall.  She continues  to deny any suicidal ideation.  She does not appear psychotic on exam.  Social work is attempting to contact patient's sister to clarify whether patient has housing.  If no housing exists, social work is attempting to work with patient to Engineering geologist.  Social work is also attempting to clarify transportation and whether patient can go to a partial hospital program after discharge.  If she returns to her prior residence there is no public transportation and no partial hospital program in that area.  Principal Problem: Severe episode of recurrent major depressive disorder, without psychotic features (HCC) Diagnosis: Principal Problem:   Severe episode of recurrent major depressive disorder, without psychotic features (HCC) Active Problems:   Anxiety disorder, unspecified  Total Time spent with patient: 20 minutes  Past Psychiatric History: See H&P  Past Medical History:  Past Medical History:  Diagnosis Date  . Anxiety   . Anxiety disorder, unspecified 08/23/2020  . Depression   . Heart murmur     Past Surgical History:  Procedure Laterality Date  . FINGER SURGERY Right    right index finger   Family History:  Family History  Problem Relation Age of Onset  . Arthritis Mother   . Vision loss Mother   . Heart disease Father   . Alcohol abuse Brother   . Depression Brother   . Heart disease Maternal Grandmother   . Alcohol abuse Maternal Grandfather   . Heart disease Paternal Grandfather   .  Alcohol abuse Brother    Family Psychiatric  History: See H&P Social History:  Social History   Substance and Sexual Activity  Alcohol Use No     Social History   Substance and Sexual Activity  Drug Use No    Social History   Socioeconomic History  . Marital status: Single    Spouse name: Not on file  . Number of children: Not on file  . Years of education: Not on file  . Highest education level: Not on file  Occupational History  . Not on file  Tobacco Use  .  Smoking status: Current Every Day Smoker    Packs/day: 1.00    Types: Cigarettes    Start date: 08/24/1980  . Smokeless tobacco: Never Used  Vaping Use  . Vaping Use: Never used  Substance and Sexual Activity  . Alcohol use: No  . Drug use: No  . Sexual activity: Not Currently  Other Topics Concern  . Not on file  Social History Narrative  . Not on file   Social Determinants of Health   Financial Resource Strain: Not on file  Food Insecurity: Not on file  Transportation Needs: Not on file  Physical Activity: Not on file  Stress: Not on file  Social Connections: Not on file   Additional Social History:                         Sleep: Good  Appetite:  Good  Current Medications: Current Facility-Administered Medications  Medication Dose Route Frequency Provider Last Rate Last Admin  . acetaminophen (TYLENOL) tablet 650 mg  650 mg Oral Q6H PRN Antonieta Pert, MD      . alum & mag hydroxide-simeth (MAALOX/MYLANTA) 200-200-20 MG/5ML suspension 30 mL  30 mL Oral Q4H PRN Antonieta Pert, MD      . escitalopram (LEXAPRO) tablet 10 mg  10 mg Oral Daily Antonieta Pert, MD   10 mg at 08/26/20 0815  . hydrOXYzine (ATARAX/VISTARIL) tablet 25 mg  25 mg Oral TID PRN Antonieta Pert, MD   25 mg at 08/26/20 0815  . magnesium hydroxide (MILK OF MAGNESIA) suspension 30 mL  30 mL Oral Daily PRN Antonieta Pert, MD      . nicotine (NICODERM CQ - dosed in mg/24 hours) patch 21 mg  21 mg Transdermal Q0600 Antonieta Pert, MD   21 mg at 08/26/20 0815  . QUEtiapine (SEROQUEL) tablet 25 mg  25 mg Oral QHS Antonieta Pert, MD   25 mg at 08/25/20 2130  . traZODone (DESYREL) tablet 50 mg  50 mg Oral QHS PRN Antonieta Pert, MD        Lab Results:  Results for orders placed or performed during the hospital encounter of 08/22/20 (from the past 48 hour(s))  CBC with Differential/Platelet     Status: Abnormal   Collection Time: 08/24/20  6:17 PM  Result Value Ref  Range   WBC 11.6 (H) 4.0 - 10.5 K/uL   RBC 4.04 3.87 - 5.11 MIL/uL   Hemoglobin 12.9 12.0 - 15.0 g/dL   HCT 35.3 29.9 - 24.2 %   MCV 99.8 80.0 - 100.0 fL   MCH 31.9 26.0 - 34.0 pg   MCHC 32.0 30.0 - 36.0 g/dL   RDW 68.3 41.9 - 62.2 %   Platelets 251 150 - 400 K/uL   nRBC 0.0 0.0 - 0.2 %   Neutrophils Relative % 69 %  Neutro Abs 8.1 (H) 1.7 - 7.7 K/uL   Lymphocytes Relative 20 %   Lymphs Abs 2.3 0.7 - 4.0 K/uL   Monocytes Relative 9 %   Monocytes Absolute 1.0 0.1 - 1.0 K/uL   Eosinophils Relative 1 %   Eosinophils Absolute 0.1 0.0 - 0.5 K/uL   Basophils Relative 1 %   Basophils Absolute 0.1 0.0 - 0.1 K/uL   Immature Granulocytes 0 %   Abs Immature Granulocytes 0.04 0.00 - 0.07 K/uL    Comment: Performed at [facility], [address] [place], [place] [address]    Blood Alcohol level:  Lab Results  Component Value Date   [facility]   ETH <10 02/04/2020    Metabolic Disorder Labs: Lab Results  Component Value Date   HGBA1C 5.5 08/21/2020   MPG 111.15 08/21/2020   Lab Results  Component Value Date   PROLACTIN 5.2 08/21/2020   Lab Results  Component Value Date   CHOL 157 08/21/2020   TRIG 54 08/21/2020   HDL 44 08/21/2020   CHOLHDL 3.6 08/21/2020   VLDL 11 08/21/2020   LDLCALC 102 (H) 08/21/2020   LDLCALC 177 (H) 11/30/2019    Physical Findings: AIMS: Facial and Oral Movements Muscles of Facial Expression: None, normal Lips and Perioral Area: None, normal Jaw: None, normal Tongue: None, normal,Extremity Movements Upper (arms, wrists, hands, fingers): None, normal Lower (legs, knees, ankles, toes): None, normal, Trunk Movements Neck, shoulders, hips: None, normal, Overall Severity Severity of abnormal movements (highest score from questions above): None, normal Incapacitation due to abnormal movements: None, normal Patient's awareness of abnormal movements (rate only patient's report): No Awareness, Dental Status Current  problems with teeth and/or dentures?: No Does patient usually wear dentures?: No  CIWA:    COWS:     Musculoskeletal: Strength & Muscle Tone: within normal limits Gait & Station: normal Patient leans: N/A  Psychiatric Specialty Exam:  Presentation  General Appearance: Appropriate for Environment; Casual  Eye Contact:Good  Speech:Clear and Coherent; Normal Rate  Speech Volume:Normal  Handedness:Right   Mood and Affect  Mood:Depressed; Anxious ("Better")  Affect:Constricted   Thought Process  Thought Processes:Coherent; Linear  Descriptions of Associations:Intact  Orientation:Full (Time, Place and Person)  Thought Content:Logical  History of Schizophrenia/Schizoaffective disorder:No  Duration of Psychotic Symptoms:No data recorded Hallucinations:Hallucinations: None  Ideas of Reference:None  Suicidal Thoughts:Suicidal Thoughts: No  Homicidal Thoughts:Homicidal Thoughts: No   Sensorium  Memory:Immediate Good; Recent Good; Remote Good  Judgment:Fair  Insight:Fair   Executive Functions  Concentration:Good  Attention Span:Good  Recall:Good  Fund of Knowledge:Fair  Language:Good   Psychomotor Activity  Psychomotor Activity:Psychomotor Activity: Normal   Assets  Assets:Desire for Improvement; Physical Health; Resilience; Social Support   Sleep  Sleep:Sleep: Good    Physical Exam: Physical Exam Vitals and nursing note reviewed.  HENT:     Head: Normocephalic and atraumatic.  Pulmonary:     Effort: Pulmonary effort is normal.  Skin:    Findings: Lesion present.     Comments: Multiple small nonpruritic nodules on scalp  Neurological:     General: No focal deficit present.     Mental Status: She is alert and oriented to person, place, and time.    Review of Systems  Constitutional: Negative for chills, diaphoresis and fever.  HENT: Negative for congestion and sore throat.   Eyes: Negative for blurred vision.  Respiratory:  Positive for cough. Negative for shortness of breath.   Cardiovascular: Negative for chest pain.  Gastrointestinal: Negative  for constipation, diarrhea, nausea and vomiting.  Genitourinary: Negative for dysuria.  Musculoskeletal: Negative for joint pain and myalgias.  Skin:       Multiple small nonpruritic nodules on scalp  Neurological: Negative for dizziness, tremors and headaches.  Psychiatric/Behavioral: Positive for depression. Negative for hallucinations and suicidal ideas. The patient is nervous/anxious.    Blood pressure 100/82, pulse (!) 103, temperature 98.7 F (37.1 C), temperature source Oral, resp. rate 20, SpO2 99 %. There is no height or weight on file to calculate BMI.   Treatment Plan Summary: Daily contact with patient to assess and evaluate symptoms and progress in treatment and Medication management   Continue 15-minute observation level for safety of patient  Encourage patient to participate in group therapy and therapeutic milieu.  Depression/anxiety -Continue Lexapro 10 mg daily for depression and anxiety -Continue quetiapine 25 mg nightly for adjunct of treatment of mood symptoms -Continue hydroxyzine 25 mg 3 times daily as needed anxiety  Insomnia -Trazodone 50 mg nightly as needed insomnia  Labs reviewed.  Repeat CBC showed WBC of 11.6 (down from 14.9) and differential showed neutrophil count of 8100 (down from 11,300).  White count and neutrophil count appear to be normalizing.  Social work is attempting to arrange aftercare plans including outpatient therapy and medication management visits.  Social work is attempting to clarify housing plan.  It is unclear whether patient can return to live in the home where she was residing prior to admission.  Patient also has no access to partial hospital program in that county and no public transportation.  If possible, plan will be for patient to attend partial hospital program however lack of transportation or lack of  PHP resources in her county may prevent this goal.  Anticipate discharge in 2 to 3 days.  I certify that inpatient services furnished can reasonably be expected to improve the patient's condition.  Claudie Revering, MD 08/26/2020, 1:32 PM

## 2020-08-26 NOTE — Progress Notes (Signed)
NUTRITION ASSESSMENT  Pt identified as at risk on the Malnutrition Screen Tool  INTERVENTION: 1. Supplements: Ensure Enlive po BID, each supplement provides 350 kcal and 20 grams of protein  NUTRITION DIAGNOSIS: Unintentional weight loss related to sub-optimal intake as evidenced by pt report.   Goal: Pt to meet >/= 90% of their estimated nutrition needs.  Monitor:  PO intake  Assessment:  Pt admitted for depression and alcohol abuse. Pt reporting decreased appetite and weight loss PTA. Pt was not weighed at admission d/t pt having anxiety.  Will order Ensure supplements.   Height: Ht Readings from Last 1 Encounters:  11/30/19 5' (1.524 m)    Weight: Wt Readings from Last 1 Encounters:  11/30/19 50.8 kg    Weight Hx: Wt Readings from Last 10 Encounters:  11/30/19 50.8 kg  01/10/18 55.5 kg  09/25/17 57.6 kg  08/09/17 58.1 kg  05/18/17 56.2 kg  10/23/16 54.4 kg  08/24/16 54 kg    BMI:  There is no height or weight on file to calculate BMI.   Estimated Nutritional Needs: Kcal: 25-30 kcal/kg Protein: > 1 gram protein/kg Fluid: 1 ml/kcal  Diet Order:  Diet Order            Diet regular Room service appropriate? Yes; Fluid consistency: Thin  Diet effective now                Pt is also offered choice of unit snacks mid-morning and mid-afternoon.  Pt is eating as desired.   Lab results and medications reviewed.   Tilda Franco, MS, RD, LDN Inpatient Clinical Dietitian Contact information available via Amion

## 2020-08-26 NOTE — BHH Counselor (Signed)
CSW spoke w/ pt's sister, Theresa Duty [phone]. Ms. Trevor Mace stated that she met with [place] about getting services for pt such as medicaid and disability. Pt will be going back to her mother's home temporarily. Ms. Trevor Mace is currently arranging with a family friend, Butch Penny, to provide transportation for pt. Pt's brother is no longer on the property. Ms. Trevor Mace would like to be notified of d/c asap. Butch Penny will pick pt up at d/c.   Toney Reil, [place] Worker [employer]

## 2020-08-27 DIAGNOSIS — F411 Generalized anxiety disorder: Secondary | ICD-10-CM

## 2020-08-27 MED ORDER — HYDROXYZINE HCL 25 MG PO TABS: 25.0000 mg | ORAL_TABLET | Freq: Three times a day (TID) | ORAL | 0 refills | Status: DC | PRN

## 2020-08-27 MED ORDER — QUETIAPINE FUMARATE 25 MG PO TABS: 25.0000 mg | ORAL_TABLET | Freq: Every day | ORAL | 0 refills | Status: DC

## 2020-08-27 MED ORDER — ESCITALOPRAM OXALATE 10 MG PO TABS: 10.0000 mg | ORAL_TABLET | Freq: Every day | ORAL | 0 refills | Status: DC

## 2020-08-27 NOTE — Progress Notes (Signed)
  [facility] Adult Case Management Discharge Plan :  Will you be returning to the same living situation after discharge:  Yes,  home At discharge, do you have transportation home?: Yes,  via family friend Do you have the ability to pay for your medications: No.  Release of information consent forms completed and in the chart;  Patient's signature needed at discharge.  Patient to Follow up at:  Follow-up Information    Services, [facility]. Go on 08/29/2020.   Why: You have a hospital follow up appointment on 08/29/20 at 9:00 am for therapy and medication management services.  This appointment will be held in person. Contact information: [address] [place] [address] [phone]               Next level of care provider has access to [facility] Link:no  Safety Planning and Suicide Prevention discussed: Yes,  w/ pt and sister     Has patient been referred to the Quitline?: Patient refused referral  Patient has been referred for addiction treatment: N/A  Felizardo Hoffmann, Theresia Majors 08/27/2020, 9:13 AM

## 2020-08-27 NOTE — BHH Counselor (Signed)
CSW contacted pt's sister to notify of d/c. CSW left a HIPAA compliant message.   Fredirick Lathe, LCSWA Clinicial Social Worker [employer]

## 2020-08-27 NOTE — Progress Notes (Signed)
RN met with pt and reviewed pt's discharge instructions.  Pt verbalized understanding of discharge instructions and pt did not have any questions. RN reviewed and provided pt with a copy of SRA, AVS and Transition Record.  RN returned pt's belongings to pt. Paper medication prescriptions and medication samples were given to pt.  Pt denied SI/HI/AVH and voiced no concerns.  Pt was appreciative of the care pt received at [facility].  Patient discharged to the lobby without incident.

## 2020-08-27 NOTE — Discharge Summary (Signed)
Physician Discharge Summary Note  Patient:  Gina Hendricks is an 60 y.o., female MRN:  [id number] DOB:  02/22/1961 Patient phone:  [phone] (home)  Patient address:   [address] [place] [address],  Total Time spent with patient: 30 minutes  Date of Admission:  08/22/2020 Date of Discharge: 08/27/2020  Reason for Admission:  (From MD's admission note): Gina Hendricks is a 60 year old female with a prior history of major depressive disorder and alcohol abuse which is currently in not active but no prior inpatient psychiatric admissions who presented to the behavioral health center with a friend for evaluation of worsening depression, anxiety and suicidal thoughts.  The patient reported having suicidal ideation with a plan to overdose on her recently deceased mother's medications.  The patient was not taking any medications on admission having weaned herself off of Xanax with the last dose reportedly on July 12, 2020.  Prior to that time patient reports that she had been taking Xanax for approximately 30 years.  She reports her dose had been up to 2 mg 3 times daily but she slowly decreased her Xanax dose before discontinuing Xanax February 11.  (PDMP indicates that last Xanax prescription filled was for 2 mg tablets #90 on 04/05/2020.)  The patient was started on Lexapro 10 mg daily and quetiapine 25 mg at bedtime at the [facility] and was transferred to the behavioral health Hospital for admission.  On interview today the patient states "I got really depressed and I cannot pull out of it.  I cannot smile, cannot laugh, cannot watch TV or function."  The patient reports having depressive symptoms for more than a month which have worsened since her mother passed away 5 days ago.  Depressive symptoms include sad mood, trouble sleeping, decreased energy, problems concentrating, decreased interest, decreased appetite (with 20 pound weight loss in the last several months),  feelings of worthlessness, withdrawal and wishes to die.  The patient reports having suicidal ideation with thoughts of taking an overdose on her mother's medication prior to admission but denies acting on these thoughts.  She denies any intent or plan to end her life today but continues to have wishes not to be alive.  She denies auditory or visual hallucinations, paranoia.  Patient states that she has been spending most of her time in the house pacing and smoking cigarettes.  She reports feeling edgy and experiencing significant worry.  The patient reports that the death of her mother has been a significant stressor.  Additionally she is unemployed.  The patient denies any recent alcohol or substance use.  She is willing to remain on Lexapro and quetiapine combination.  On exam she is thin with constricted affect and appears depressed and anxious.  She was tachycardic this morning with a heart rate of 124 standing but heart rate was 100 sitting.  Blood pressure was normal.  Evaluation on the unit, day of discharge: Patient is seen and evaluated. Patient is visible on the unit, attending group therapy and interacting appropriately with staff and peers. She reported good sleep and fair appetite. She has been drinking Ensure between meals for added caloric intake given her weight loss over the past several months. She is taking her medications and has no issues with them. She described her mood as "better" today and feels ready to be discharged. She denies SI/HI/AVH, paranoia and delusions. She has follow up appointments listed in her discharge paperwork and she agrees to go to her appointments and continue  to take her medications. Patient will return home with her family. Patient is stable for discharge home today.   Principal Problem: Severe episode of recurrent major depressive disorder, without psychotic features [facility]) Discharge Diagnoses: Principal Problem:   Severe episode of recurrent major depressive  disorder, without psychotic features (HCC) Active Problems:   GAD (generalized anxiety disorder)   Past Psychiatric History: See H&P  Past Medical History:  Past Medical History:  Diagnosis Date  . Anxiety   . Anxiety disorder, unspecified 08/23/2020  . Depression   . Heart murmur     Past Surgical History:  Procedure Laterality Date  . FINGER SURGERY Right    right index finger   Family History:  Family History  Problem Relation Age of Onset  . Arthritis Mother   . Vision loss Mother   . Heart disease Father   . Alcohol abuse Brother   . Depression Brother   . Heart disease Maternal Grandmother   . Alcohol abuse Maternal Grandfather   . Heart disease Paternal Grandfather   . Alcohol abuse Brother    Family Psychiatric  History: See H&P Social History:  Social History   Substance and Sexual Activity  Alcohol Use No     Social History   Substance and Sexual Activity  Drug Use No    Social History   Socioeconomic History  . Marital status: Single    Spouse name: Not on file  . Number of children: Not on file  . Years of education: Not on file  . Highest education level: Not on file  Occupational History  . Not on file  Tobacco Use  . Smoking status: Current Every Day Smoker    Packs/day: 1.00    Types: Cigarettes    Start date: 08/24/1980  . Smokeless tobacco: Never Used  Vaping Use  . Vaping Use: Never used  Substance and Sexual Activity  . Alcohol use: No  . Drug use: No  . Sexual activity: Not Currently  Other Topics Concern  . Not on file  Social History Narrative  . Not on file   Social Determinants of Health   Financial Resource Strain: Not on file  Food Insecurity: Not on file  Transportation Needs: Not on file  Physical Activity: Not on file  Stress: Not on file  Social Connections: Not on file    Hospital Course: After the above admission evaluation, Gina Hendricks's presenting symptoms were noted. She was recommended for mood  stabilization treatments. The medication regimen targeting those presenting symptoms were discussed with him/her & initiated with her consent. Her UDS and BAL on arrival to the ED were negative. She was however medicated, stabilized & discharged on the medications as listed on her discharge medication lists below. Besides the mood stabilization treatments, Tangie was also enrolled & participated in the group counseling sessions being offered & held on this unit. She learned coping skills. She presented no other significant pre-existing medical issues that required treatment. She tolerated his treatment regimen without any adverse effects or reactions reported.   During the course of her hospitalization, the 15-minute checks were adequate to ensure patient's safety. Carlon did not display any dangerous, violent or suicidal behavior on the unit.  She interacted with patients & staff appropriately, participated appropriately in the group sessions/therapies. Her medications were addressed & adjusted to meet her needs. She was recommended for outpatient follow-up care & medication management upon discharge to assure continuity of care & mood stability.  At the time of discharge  patient is not reporting any acute suicidal/homicidal ideations. She feels more confident about her self-care & in managing his mental health. She currently denies any new issues or concerns. Education and supportive counseling provided throughout her hospital stay & upon discharge.   Today upon her discharge evaluation with the attending psychiatrist, Emireth shares she is doing well. She denies any other specific concerns. She is sleeping well. Her appetite is good. She denies other physical complaints. She denies AH/VH, delusional thoughts or paranoia. She does not appear to be responding to any internal stimuli. She feels that her medications have been helpful & is in agreement to continue her current treatment regimen as recommended. She was  able to engage in safety planning including plan to return to [facility] or contact emergency services if she feels unable to maintain her own safety or the safety of others. Pt had no further questions, comments, or concerns. She left [facility] with all personal belongings in no apparent distress. Transportation home with a family friend.   Physical Findings: AIMS: Facial and Oral Movements Muscles of Facial Expression: None, normal Lips and Perioral Area: None, normal Jaw: None, normal Tongue: None, normal,Extremity Movements Upper (arms, wrists, hands, fingers): None, normal Lower (legs, knees, ankles, toes): None, normal, Trunk Movements Neck, shoulders, hips: None, normal, Overall Severity Severity of abnormal movements (highest score from questions above): None, normal Incapacitation due to abnormal movements: None, normal Patient's awareness of abnormal movements (rate only patient's report): No Awareness, Dental Status Current problems with teeth and/or dentures?: No Does patient usually wear dentures?: No  CIWA:    COWS:     Musculoskeletal: Strength & Muscle Tone: within normal limits Gait & Station: normal Patient leans: N/A  Psychiatric Specialty Exam:  Presentation  General Appearance: Appropriate for Environment; Casual  Eye Contact:Good  Speech:Clear and Coherent; Normal Rate  Speech Volume:Normal  Handedness:Right  Mood and Affect  Mood:Depressed; Anxious ("Better")  Affect:Constricted  Thought Process  Thought Processes:Coherent; Linear  Descriptions of Associations:Intact  Orientation:Full (Time, Place and Person)  Thought Content:Logical  History of Schizophrenia/Schizoaffective disorder:No  Duration of Psychotic Symptoms:No data recorded Hallucinations:Hallucinations: None  Ideas of Reference:None  Suicidal Thoughts:Suicidal Thoughts: No  Homicidal Thoughts:Homicidal Thoughts: No  Sensorium  Memory:Immediate Good; Recent Good; Remote  Good  Judgment:Fair  Insight:Fair  Executive Functions  Concentration:Good  Attention Span:Good  Recall:Good  Fund of Knowledge:Fair  Language:Good  Psychomotor Activity  Psychomotor Activity:Psychomotor Activity: Normal  Assets  Assets:Desire for Improvement; Physical Health; Resilience; Social Support  Sleep  Sleep:Sleep: Good  Physical Exam: Physical Exam Vitals and nursing note reviewed.  Constitutional:      Appearance: Normal appearance.  HENT:     Head: Normocephalic.  Pulmonary:     Effort: Pulmonary effort is normal.  Musculoskeletal:        General: Normal range of motion.     Cervical back: Normal range of motion.  Neurological:     General: No focal deficit present.     Mental Status: She is alert and oriented to person, place, and time.  Psychiatric:        Attention and Perception: She does not perceive auditory or visual hallucinations.        Mood and Affect: Mood normal.        Speech: Speech normal.        Behavior: Behavior normal. Behavior is cooperative.        Thought Content: Thought content is not paranoid or delusional. Thought content does not include homicidal or  suicidal ideation. Thought content does not include homicidal or suicidal plan.        Cognition and Memory: Cognition normal.    ROS Blood pressure 113/76, pulse 68, temperature 98.5 F (36.9 C), temperature source Oral, resp. rate 18, height 4\' 11"  (1.499 m), weight 47.6 kg, SpO2 99 %. Body mass index is 21.21 kg/m.  Has this patient used any form of tobacco in the last 30 days? (Cigarettes, Smokeless Tobacco, Cigars, and/or Pipes) Yes, Yes, A prescription for an FDA-approved tobacco cessation medication was offered at discharge and the patient refused  Blood Alcohol level:  Lab Results  Component Value Date   [facility] 08/21/2020   ETH <10 02/04/2020    Metabolic Disorder Labs:  Lab Results  Component Value Date   HGBA1C 5.5 08/21/2020   MPG 111.15 08/21/2020    Lab Results  Component Value Date   PROLACTIN 5.2 08/21/2020   Lab Results  Component Value Date   CHOL 157 08/21/2020   TRIG 54 08/21/2020   HDL 44 08/21/2020   CHOLHDL 3.6 08/21/2020   VLDL 11 08/21/2020   LDLCALC 102 (H) 08/21/2020   LDLCALC 177 (H) 11/30/2019    See Psychiatric Specialty Exam and Suicide Risk Assessment completed by Attending Physician prior to discharge.  Discharge destination:  Home  Is patient on multiple antipsychotic therapies at discharge:  No   Has Patient had three or more failed trials of antipsychotic monotherapy by history:  No  Recommended Plan for Multiple Antipsychotic Therapies: NA  Discharge Instructions    Diet - low sodium heart healthy   Complete by: As directed    Increase activity slowly   Complete by: As directed      Allergies as of 08/27/2020      Reactions   Zoloft [sertraline Hcl] Other (See Comments)   "out of body experience"      Medication List    STOP taking these medications   nicotine 21 mg/24hr patch Commonly known as: NICODERM CQ - dosed in mg/24 hours     TAKE these medications     Indication  escitalopram 10 MG tablet Commonly known as: LEXAPRO Take 1 tablet (10 mg total) by mouth daily.  Indication: Generalized Anxiety Disorder, Major Depressive Disorder   hydrOXYzine 25 MG tablet Commonly known as: ATARAX/VISTARIL Take 1 tablet (25 mg total) by mouth 3 (three) times daily as needed for anxiety.  Indication: Feeling Anxious   QUEtiapine 25 MG tablet Commonly known as: SEROQUEL Take 1 tablet (25 mg total) by mouth at bedtime.  Indication: Major Depressive Disorder       Follow-up Information    Services, [facility]. Go on 08/29/2020.   Why: You have a hospital follow up appointment on 08/29/20 at 9:00 am for therapy and medication management services.  This appointment will be held in person. Contact information: [address] [place] [address] [phone]                Follow-up recommendations:  Activity:  as tolerated Diet:  Heart healthy  Comments:  Prescriptions given at discharge.  Patient agreeable to plan.  Given opportunity to ask questions.  Appears to feel comfortable with discharge denies any current suicidal or homicidal thoughts.   Patient is instructed prior to discharge to: Take all medications as prescribed by her mental healthcare provider. Report any adverse effects and or reactions from the medicines to her outpatient provider promptly. Patient has been instructed & cautioned: To not engage in  alcohol and or illegal drug use while on prescription medicines. In the event of worsening symptoms, patient is instructed to call the crisis hotline, 911 and or go to the nearest ED for appropriate evaluation and treatment of symptoms. To follow-up with her primary care provider for your other medical issues, concerns and or health care needs.  Signed: Laveda Abbe, NP 08/27/2020, 10:21 AM

## 2020-08-27 NOTE — [facility] Suicide Risk Assessment (Signed)
[facility] Discharge Suicide Risk Assessment   Principal Problem: Severe episode of recurrent major depressive disorder, without psychotic features (HCC) Discharge Diagnoses: Principal Problem:   Severe episode of recurrent major depressive disorder, without psychotic features (HCC) Active Problems:   Anxiety disorder, unspecified   Total Time spent with patient: 15 minutes  Musculoskeletal: Strength & Muscle Tone: within normal limits Gait & Station: normal Patient leans: N/A  Psychiatric Specialty Exam: Review of Systems  Constitutional: Negative for diaphoresis and fever.  HENT: Negative for sore throat.   Respiratory: Positive for cough. Negative for shortness of breath.   Cardiovascular: Negative for chest pain and palpitations.  Gastrointestinal: Positive for constipation. Negative for diarrhea, nausea and vomiting.       Pt reports chronic difficulty having BM in places other than home  Genitourinary: Negative for difficulty urinating.  Musculoskeletal: Negative for arthralgias and myalgias.  Skin:       multiple small rased nodules on scalp, nonpruritic  Neurological: Negative for dizziness, light-headedness and headaches.  Hematological: Does not bruise/bleed easily.  Psychiatric/Behavioral: Negative for agitation, dysphoric mood, hallucinations, self-injury and suicidal ideas. The patient is not nervous/anxious.     Blood pressure 113/76, pulse 68, temperature 98.5 F (36.9 C), temperature source Oral, resp. rate 18, height 4\' 11"  (1.499 m), weight 47.6 kg, SpO2 99 %.Body mass index is 21.21 kg/m.  General Appearance: Casual  Eye Contact::  Good  Speech:  Clear and Coherent and Normal Rate  Volume:  Normal  Mood:  Better; less anxious, less depressed  Affect:  Appropriate and Congruent  Thought Process:  Coherent, Goal Directed and Linear  Orientation:  Full (Time, Place, and Person)  Thought Content:  Logical and no paranoid ideation, no referential thinking  Suicidal  Thoughts:  No  Homicidal Thoughts:  No  Memory:  Immediate;   Good Recent;   Good Remote;   Good  Judgement:  Fair  Insight:  Fair  Psychomotor Activity:  Normal  Concentration:  Good  Recall:  Good  Fund of Knowledge:Good  Language: Good  Akathisia:  No    AIMS (if indicated):     Assets:  Communication Skills Desire for Improvement Housing Leisure Time Physical Health Resilience Social Support  Sleep:  Number of Hours: 6.25  Cognition: WNL  ADL's:  Intact   Mental Status Per Nursing Assessment::   On Admission:  Suicidal ideation indicated by patient  Demographic Factors:  Caucasian, Living alone and Unemployed  Loss Factors: Loss of significant relationship  Historical Factors: Family history of mental illness or substance abuse  Risk Reduction Factors:   Sense of responsibility to family and Positive social support  (Has sister who is involved and has a supportive friend)  Continued Clinical Symptoms:  Depression  Anxiety Previous Psychiatric Diagnoses and Treatments  Cognitive Features That Contribute To Risk:  None    Suicide Risk:  Mild:  Suicidal ideation of limited frequency, intensity, duration, and specificity.  There are no identifiable plans, no associated intent, mild dysphoria and related symptoms, good self-control (both objective and subjective assessment), few other risk factors, and identifiable protective factors, including available and accessible social support.   Follow-up Information    [facility], [facility]. Go on 08/29/2020.   Why: You have a hospital follow up appointment on 08/29/20 at 9:00 am for therapy and medication management services.  This appointment will be held in person. Contact information: [address] [place] [phone]  Plan Of Care/Follow-up recommendations:  Activity:  as tolerated Other:  Take medications as prescribed.  Keep follow up appointments for therapy and  psychiatric medication management. See your primary care provider about abnormal skin lesions on scalp and ask for dermatology referral.  Do not drink alcohol or use drugs.   Claudie Revering, MD 08/27/2020, 8:25 AM

## 2021-04-14 ENCOUNTER — Ambulatory Visit (INDEPENDENT_AMBULATORY_CARE_PROVIDER_SITE_OTHER): Payer: Medicaid Other | Admitting: Family Medicine

## 2021-04-14 ENCOUNTER — Encounter: Payer: Self-pay | Admitting: Family Medicine

## 2021-04-14 DIAGNOSIS — F332 Major depressive disorder, recurrent severe without psychotic features: Secondary | ICD-10-CM

## 2021-04-14 MED ORDER — ONDANSETRON 4 MG PO TBDP: 4.0000 mg | ORAL_TABLET | Freq: Four times a day (QID) | ORAL | 1 refills | Status: DC | PRN

## 2021-04-14 MED ORDER — VENLAFAXINE HCL ER 75 MG PO CP24: 75.0000 mg | ORAL_CAPSULE | Freq: Two times a day (BID) | ORAL | 1 refills | Status: DC

## 2021-04-14 NOTE — Progress Notes (Signed)
Subjective:    Patient ID: Gina Hendricks, female    DOB: 1960/12/04, 60 y.o.   MRN: [id number]   HPI: Gina Hendricks is a 60 y.o. female presenting for emotions are gone. I cry all the time. I have a hard time taking a shower. Animals freak her out. Can't leave the house. Went to [place] [employer] briefly in the spring of this year. Can't prepare her meals. It ain't right. ain't relax. Inside is jumpy all the time. Onset was 7-8 mos ago. No better, Probably worse. Thoughts race all the time. I have been depressed since the 1990's. Denies suicidality. Does think about it daily.   Depression screen [facility] 2/9 02/04/2020 11/30/2019 01/10/2018 08/09/2017 05/18/2017  Decreased Interest 2 1 1  0 0  Down, Depressed, Hopeless 2 1 1  0 0  PHQ - 2 Score 4 2 2  0 0  Altered sleeping 2 1 2  - -  Tired, decreased energy 2 3 2  - -  Change in appetite 2 1 2  - -  Feeling bad or failure about yourself  2 1 2  - -  Trouble concentrating 1 0 0 - -  Moving slowly or fidgety/restless 2 0 1 - -  Suicidal thoughts 2 0 0 - -  PHQ-9 Score 17 8 11  - -  Difficult doing work/chores Very difficult Extremely dIfficult Somewhat difficult - -     Relevant past medical, surgical, family and social history reviewed and updated as indicated.  Interim medical history since our last visit reviewed. Allergies and medications reviewed and updated.  ROS:  Review of Systems  Constitutional: Negative.   HENT: Negative.    Eyes:  Negative for visual disturbance.  Respiratory:  Negative for shortness of breath.   Cardiovascular:  Negative for chest pain.  Gastrointestinal:  Negative for abdominal pain.  Musculoskeletal:  Negative for arthralgias.    Social History   Tobacco Use  Smoking Status Every Day   Packs/day: 1.00   Types: Cigarettes   Start date: 08/24/1980  Smokeless Tobacco Never       Objective:     Wt Readings from Last 3 Encounters:  11/30/19 112 lb (50.8 kg)  01/10/18 122 lb 6.4 oz (55.5 kg)   09/25/17 127 lb (57.6 kg)     Exam deferred. Pt. Harboring due to COVID 19. Phone visit performed.   Tearful, crying throughout the evaluation. However, she is lucid, talking clearly. Denies suicidal intent  Assessment & Plan:   1. Severe episode of recurrent major depressive disorder, without psychotic features (HCC)     Meds ordered this encounter  Medications   venlafaxine XR (EFFEXOR-XR) 75 MG 24 hr capsule    Sig: Take 1 capsule (75 mg total) by mouth 2 (two) times daily.    Dispense:  60 capsule    Refill:  1   ondansetron (ZOFRAN-ODT) 4 MG disintegrating tablet    Sig: Take 1 tablet (4 mg total) by mouth every 6 (six) hours as needed for nausea.    Dispense:  60 tablet    Refill:  1    No orders of the defined types were placed in this encounter.     Diagnoses and all orders for this visit:  Severe episode of recurrent major depressive disorder, without psychotic features (HCC)  Other orders -     venlafaxine XR (EFFEXOR-XR) 75 MG 24 hr capsule; Take 1 capsule (75 mg total) by mouth 2 (two) times daily. -     ondansetron (  ZOFRAN-ODT) 4 MG disintegrating tablet; Take 1 tablet (4 mg total) by mouth every 6 (six) hours as needed for nausea.   Virtual Visit via telephone Note  I discussed the limitations, risks, security and privacy concerns of performing an evaluation and management service by telephone and the availability of in person appointments. The patient was identified with two identifiers. Pt.expressed understanding and agreed to proceed. Pt. Is at home. Dr. Livia Snellen is in his office.  Follow Up Instructions:   I discussed the assessment and treatment plan with the patient. The patient was provided an opportunity to ask questions and all were answered. The patient agreed with the plan and demonstrated an understanding of the instructions.   The patient was advised to call back or seek an in-person evaluation if the symptoms worsen or if the condition fails to  improve as anticipated.   Total minutes including chart review and phone contact time: 24   Follow up plan: Return in about 2 weeks (around 04/28/2021).  Claretta Fraise, MD [place]

## 2021-07-28 ENCOUNTER — Encounter: Payer: Self-pay | Admitting: Nurse Practitioner

## 2021-07-28 ENCOUNTER — Ambulatory Visit (INDEPENDENT_AMBULATORY_CARE_PROVIDER_SITE_OTHER): Payer: Medicaid Other | Admitting: Nurse Practitioner

## 2021-07-28 VITALS — BP 115/84 | HR 121 | Temp 98.7°F | Ht 59.0 in | Wt 103.0 lb

## 2021-07-28 DIAGNOSIS — F419 Anxiety disorder, unspecified: Secondary | ICD-10-CM | POA: Diagnosis not present

## 2021-07-28 DIAGNOSIS — F332 Major depressive disorder, recurrent severe without psychotic features: Secondary | ICD-10-CM | POA: Diagnosis not present

## 2021-07-28 DIAGNOSIS — Z0289 Encounter for other administrative examinations: Secondary | ICD-10-CM

## 2021-07-28 DIAGNOSIS — R110 Nausea: Secondary | ICD-10-CM | POA: Diagnosis not present

## 2021-07-28 MED ORDER — HYDROXYZINE HCL 10 MG PO TABS: 10.0000 mg | ORAL_TABLET | Freq: Three times a day (TID) | ORAL | 1 refills | Status: DC | PRN

## 2021-07-28 MED ORDER — ONDANSETRON HCL 4 MG PO TABS: 4.0000 mg | ORAL_TABLET | Freq: Three times a day (TID) | ORAL | 0 refills | Status: DC | PRN

## 2021-07-28 MED ORDER — VENLAFAXINE HCL ER 150 MG PO CP24: 150.0000 mg | ORAL_CAPSULE | Freq: Every day | ORAL | 3 refills | Status: DC

## 2021-07-28 NOTE — Progress Notes (Signed)
Established Patient Office Visit  Subjective:  Patient ID: Gina Hendricks, female    DOB: 1960/06/13  Age: 61 y.o. MRN: [id number]  CC:  Chief Complaint  Patient presents with   anxiety / depression    HPI Gina Hendricks presents for Anxiety, Follow-up  She was last seen for anxiety 1 years ago. Changes made at last visit include Effexor 140 mg tablet by mouth daily, patient is not taking.   She reports poor compliance with treatment. She reports poor tolerance of treatment. She is not having side effects.   She feels her anxiety is severe and Worse since last visit.  Symptoms: No chest pain Yes difficulty concentrating  No dizziness No fatigue  Yes feelings of losing control Yes insomnia  Yes irritable No palpitations  Yes panic attacks Yes racing thoughts  No shortness of breath No sweating  No tremors/shakes    GAD-7 Results GAD-7 Generalized Anxiety Disorder Screening Tool 07/28/2021 11/30/2019  1. Feeling Nervous, Anxious, or on Edge 3 1  2. Not Being Able to Stop or Control Worrying 3 1  3. Worrying Too Much About Different Things 3 1  4. Trouble Relaxing 3 1  5. Being So Restless it's Hard To Sit Still 3 1  6. Becoming Easily Annoyed or Irritable 3 3  7. Feeling Afraid As If Something Awful Might Happen 3 0  Total GAD-7 Score 21 8  Difficulty At Work, Home, or Getting  Along With Others? Extremely difficult Extremely difficult    PHQ-9 Scores PHQ9 SCORE ONLY 07/28/2021 02/04/2020 11/30/2019  PHQ-9 Total Score 22 17 8    Depression, Follow-up  She  was last seen for this 1  years ago. Changes made at last visit include Effexor 140 mg tablet by mouth daily.   She reports poor compliance with treatment. She is not having side effects. Patient is not currently taking any medication  She reports poor tolerance of treatment. Current symptoms include: anhedonia, depressed mood, difficulty concentrating, fatigue, feelings of worthlessness/guilt, hopelessness,  insomnia, and recurrent thoughts of death She feels she is Worse since last visit.  Depression screen [facility] 2/9 07/28/2021 02/04/2020 11/30/2019  Decreased Interest 3 2 1   Down, Depressed, Hopeless 3 2 1   PHQ - 2 Score 6 4 2   Altered sleeping 3 2 1   Tired, decreased energy 2 2 3   Change in appetite 2 2 1   Feeling bad or failure about yourself  2 2 1   Trouble concentrating 3 1 0  Moving slowly or fidgety/restless 2 2 0  Suicidal thoughts 2 2 0  PHQ-9 Score 22 17 8   Difficult doing work/chores Very difficult Very difficult Extremely dIfficult      Past Medical History:  Diagnosis Date   Anxiety    Anxiety disorder, unspecified 08/23/2020   Depression    Heart murmur     Past Surgical History:  Procedure Laterality Date   FINGER SURGERY Right    right index finger    Family History  Problem Relation Age of Onset   Arthritis Mother    Vision loss Mother    Heart disease Father    Alcohol abuse Brother    Depression Brother    Heart disease Maternal Grandmother    Alcohol abuse Maternal Grandfather    Heart disease Paternal Grandfather    Alcohol abuse Brother     Social History   Socioeconomic History   Marital status: Single    Spouse name: Not on file   Number of  children: Not on file   Years of education: Not on file   Highest education level: Not on file  Occupational History   Not on file  Tobacco Use   Smoking status: Every Day    Packs/day: 1.00    Types: Cigarettes    Start date: 08/24/1980   Smokeless tobacco: Never  Vaping Use   Vaping Use: Never used  Substance and Sexual Activity   Alcohol use: No   Drug use: No   Sexual activity: Not Currently  Other Topics Concern   Not on file  Social History Narrative   Not on file   Social Determinants of Health   Financial Resource Strain: Not on file  Food Insecurity: Not on file  Transportation Needs: Not on file  Physical Activity: Not on file  Stress: Not on file  Social Connections: Not on file   Intimate Partner Violence: Not on file    Outpatient Medications Prior to Visit  Medication Sig Dispense Refill   ondansetron (ZOFRAN-ODT) 4 MG disintegrating tablet Take 1 tablet (4 mg total) by mouth every 6 (six) hours as needed for nausea. 60 tablet 1   venlafaxine XR (EFFEXOR-XR) 75 MG 24 hr capsule Take 1 capsule (75 mg total) by mouth 2 (two) times daily. 60 capsule 1   No facility-administered medications prior to visit.    Allergies  Allergen Reactions   Zoloft [Sertraline Hcl] Other (See Comments)    "out of body experience"    ROS Review of Systems  Constitutional: Negative.   HENT: Negative.    Eyes: Negative.   Respiratory: Negative.    Cardiovascular: Negative.   Gastrointestinal: Negative.   Endocrine: Negative.   Genitourinary: Negative.   Skin: Negative.  Negative for rash.  Psychiatric/Behavioral:  The patient is nervous/anxious.   All other systems reviewed and are negative.    Objective:    Physical Exam Vitals and nursing note reviewed.  Constitutional:      Appearance: Normal appearance.  HENT:     Head: Normocephalic.     Right Ear: External ear normal.     Left Ear: External ear normal.     Nose: Nose normal.     Mouth/Throat:     Mouth: Mucous membranes are moist.     Pharynx: Oropharynx is clear.  Eyes:     Conjunctiva/sclera: Conjunctivae normal.  Cardiovascular:     Rate and Rhythm: Normal rate and regular rhythm.     Pulses: Normal pulses.     Heart sounds: Normal heart sounds.  Pulmonary:     Effort: Pulmonary effort is normal.     Breath sounds: Normal breath sounds.  Abdominal:     General: Bowel sounds are normal.  Skin:    General: Skin is warm.  Neurological:     Mental Status: She is alert and oriented to person, place, and time.  Psychiatric:        Attention and Perception: Attention and perception normal.        Mood and Affect: Mood is anxious and depressed.        Speech: Speech normal.        Behavior:  Behavior normal. Behavior is cooperative.        Thought Content: Thought content includes suicidal ideation. Thought content does not include suicidal plan.        Cognition and Memory: Cognition normal.    BP 115/84    Pulse (!) 121    Temp 98.7 F (37.1 C)  Ht 4\' 11"  (1.499 m)    Wt 103 lb (46.7 kg)    SpO2 95%    BMI 20.80 kg/m  Wt Readings from Last 3 Encounters:  07/28/21 103 lb (46.7 kg)  11/30/19 112 lb (50.8 kg)  01/10/18 122 lb 6.4 oz (55.5 kg)     Health Maintenance Due  Topic Date Due   HIV Screening  Never done   Hepatitis C Screening  Never done   MAMMOGRAM  Never done   Zoster Vaccines- Shingrix (1 of 2) Never done   PAP SMEAR-Modifier  08/25/2019   COVID-19 Vaccine (3 - Booster for Moderna series) 12/05/2019   INFLUENZA VACCINE  12/30/2020    There are no preventive care reminders to display for this patient.  Lab Results  Component Value Date   TSH 1.442 08/21/2020   Lab Results  Component Value Date   WBC 11.6 (H) 08/24/2020   HGB 12.9 08/24/2020   HCT 40.3 08/24/2020   MCV 99.8 08/24/2020   PLT 251 08/24/2020   Lab Results  Component Value Date   NA 138 08/21/2020   K 3.9 08/21/2020   CO2 23 08/21/2020   GLUCOSE 105 (H) 08/21/2020   BUN 14 08/21/2020   CREATININE 0.67 08/21/2020   BILITOT 0.4 08/21/2020   ALKPHOS 87 08/21/2020   AST 19 08/21/2020   ALT 15 08/21/2020   PROT 6.7 08/21/2020   ALBUMIN 3.6 08/21/2020   CALCIUM 9.5 08/21/2020   ANIONGAP 7 08/21/2020   Lab Results  Component Value Date   CHOL 157 08/21/2020   Lab Results  Component Value Date   HDL 44 08/21/2020   Lab Results  Component Value Date   LDLCALC 102 (H) 08/21/2020   Lab Results  Component Value Date   TRIG 54 08/21/2020   Lab Results  Component Value Date   CHOLHDL 3.6 08/21/2020   Lab Results  Component Value Date   HGBA1C 5.5 08/21/2020      Assessment & Plan:  Depression and anxiety not well controlled. Recent death of patients mother has  made patients symptoms worse.  Patient was last seen about a year ago, patient is not compliant with medication. Started patient on Effexor 150 mg tablet by mouth daily with breakfast and follow up in 6 weeks. Hydroxyzine 10 mg tablet by mouth 3 times daily as needed for anxiety.  Provided education to patient and resources for counseling and [facility] emergency [facility].  For nausea, patient can use Zofran 4 mg tablet by mouth daily and knows to follow up as needed.   Problem List Items Addressed This Visit       Other   Severe episode of recurrent major depressive disorder, without psychotic features (HCC)   Relevant Medications   venlafaxine XR (EFFEXOR XR) 150 MG 24 hr capsule   hydrOXYzine (ATARAX) 10 MG tablet   Anxiety   Relevant Medications   venlafaxine XR (EFFEXOR XR) 150 MG 24 hr capsule   hydrOXYzine (ATARAX) 10 MG tablet   Nausea - Primary   Relevant Medications   ondansetron (ZOFRAN) 4 MG tablet    Meds ordered this encounter  Medications   ondansetron (ZOFRAN) 4 MG tablet    Sig: Take 1 tablet (4 mg total) by mouth every 8 (eight) hours as needed for nausea or vomiting.    Dispense:  20 tablet    Refill:  0    Order Specific Question:   Supervising Provider    Answer:  Claretta Fraise [id number]   venlafaxine XR (EFFEXOR XR) 150 MG 24 hr capsule    Sig: Take 1 capsule (150 mg total) by mouth daily with breakfast.    Dispense:  30 capsule    Refill:  3    Order Specific Question:   Supervising Provider    Answer:   Claretta Fraise [id number]   hydrOXYzine (ATARAX) 10 MG tablet    Sig: Take 1 tablet (10 mg total) by mouth 3 (three) times daily as needed.    Dispense:  30 tablet    Refill:  1    Order Specific Question:   Supervising Provider    Answer:   Claretta Fraise [phone]    Follow-up: Return in about 6 weeks (around 09/08/2021), or if symptoms worsen or fail to improve, for depression.    Ivy Lynn, NP

## 2021-07-28 NOTE — Patient Instructions (Signed)
Generalized Anxiety Disorder, Adult °Generalized anxiety disorder (GAD) is a mental health condition. Unlike normal worries, anxiety related to GAD is not triggered by a specific event. These worries do not fade or get better with time. GAD interferes with relationships, work, and school. °GAD symptoms can vary from mild to severe. People with severe GAD can have intense waves of anxiety with physical symptoms that are similar to panic attacks. °What are the causes? °The exact cause of GAD is not known, but the following are believed to have an impact: °Differences in natural brain chemicals. °Genes passed down from parents to children. °Differences in the way threats are perceived. °Development and stress during childhood. °Personality. °What increases the risk? °The following factors may make you more likely to develop this condition: °Being female. °Having a family history of anxiety disorders. °Being very shy. °Experiencing very stressful life events, such as the death of a loved one. °Having a very stressful family environment. °What are the signs or symptoms? °People with GAD often worry excessively about many things in their lives, such as their health and family. Symptoms may also include: °Mental and emotional symptoms: °Worrying excessively about natural disasters. °Fear of being late. °Difficulty concentrating. °Fears that others are judging your performance. °Physical symptoms: °Fatigue. °Headaches, muscle tension, muscle twitches, trembling, or feeling shaky. °Feeling like your heart is pounding or beating very fast. °Feeling out of breath or like you cannot take a deep breath. °Having trouble falling asleep or staying asleep, or experiencing restlessness. °Sweating. °Nausea, diarrhea, or irritable bowel syndrome (IBS). °Behavioral symptoms: °Experiencing erratic moods or irritability. °Avoidance of new situations. °Avoidance of people. °Extreme difficulty making decisions. °How is this diagnosed? °This  condition is diagnosed based on your symptoms and medical history. You will also have a physical exam. Your health care provider may perform tests to rule out other possible causes of your symptoms. °To be diagnosed with GAD, a person must have anxiety that: °Is out of his or her control. °Affects several different aspects of his or her life, such as work and relationships. °Causes distress that makes him or her unable to take part in normal activities. °Includes at least three symptoms of GAD, such as restlessness, fatigue, trouble concentrating, irritability, muscle tension, or sleep problems. °Before your health care provider can confirm a diagnosis of GAD, these symptoms must be present more days than they are not, and they must last for 6 months or longer. °How is this treated? °This condition may be treated with: °Medicine. Antidepressant medicine is usually prescribed for long-term daily control. Anti-anxiety medicines may be added in severe cases, especially when panic attacks occur. °Talk therapy (psychotherapy). Certain types of talk therapy can be helpful in treating GAD by providing support, education, and guidance. Options include: °Cognitive behavioral therapy (CBT). People learn coping skills and self-calming techniques to ease their physical symptoms. They learn to identify unrealistic thoughts and behaviors and to replace them with more appropriate thoughts and behaviors. °Acceptance and commitment therapy (ACT). This treatment teaches people how to be mindful as a way to cope with unwanted thoughts and feelings. °Biofeedback. This process trains you to manage your body's response (physiological response) through breathing techniques and relaxation methods. You will work with a therapist while machines are used to monitor your physical symptoms. °Stress management techniques. These include yoga, meditation, and exercise. °A mental health specialist can help determine which treatment is best for you.  Some people see improvement with one type of therapy. However, other people require   a combination of therapies. °Follow these instructions at home: °Lifestyle °Maintain a consistent routine and schedule. °Anticipate stressful situations. Create a plan and allow extra time to work with your plan. °Practice stress management or self-calming techniques that you have learned from your therapist or your health care provider. °Exercise regularly and spend time outdoors. °Eat a healthy diet that includes plenty of vegetables, fruits, whole grains, low-fat dairy products, and lean protein. °Do not eat a lot of foods that are high in fat, added sugar, or salt (sodium). °Drink plenty of water. °Avoid alcohol. Alcohol can increase anxiety. °Avoid caffeine and certain over-the-counter cold medicines. These may make you feel worse. Ask your pharmacist which medicines to avoid. °General instructions °Take over-the-counter and prescription medicines only as told by your health care provider. °Understand that you are likely to have setbacks. Accept this and be kind to yourself as you persist to take better care of yourself. °Anticipate stressful situations. Create a plan and allow extra time to work with your plan. °Recognize and accept your accomplishments, even if you judge them as small. °Spend time with people who care about you. °Keep all follow-up visits. This is important. °Where to find more information °National Institute of Mental Health: www.nimh.nih.gov °Substance Abuse and Mental Health Services: www.samhsa.gov °Contact a health care provider if: °Your symptoms do not get better. °Your symptoms get worse. °You have signs of depression, such as: °A persistently sad or irritable mood. °Loss of enjoyment in activities that used to bring you joy. °Change in weight or eating. °Changes in sleeping habits. °Get help right away if: °You have thoughts about hurting yourself or others. °If you ever feel like you may hurt  yourself or others, or have thoughts about taking your own life, get help right away. Go to your nearest emergency department or: °Call your local emergency services (911 in the [place] °Call a suicide crisis helpline, such as the National Suicide Prevention Lifeline at 1-800-273-8255 or 988 in the [place] This is open 24 hours a day in the [place] °Text the Crisis Text Line at 741741 (in the [place] °Summary °Generalized anxiety disorder (GAD) is a mental health condition that involves worry that is not triggered by a specific event. °People with GAD often worry excessively about many things in their lives, such as their health and family. °GAD may cause symptoms such as restlessness, trouble concentrating, sleep problems, frequent sweating, nausea, diarrhea, headaches, and trembling or muscle twitching. °A mental health specialist can help determine which treatment is best for you. Some people see improvement with one type of therapy. However, other people require a combination of therapies. °This information is not intended to replace advice given to you by your health care provider. Make sure you discuss any questions you have with your health care provider. °Document Revised: 12/11/2020 Document Reviewed: 09/08/2020 °Elsevier Patient Education © 2022 [employer] Inc[employer] °Major Depressive Disorder, Adult °Major depressive disorder is a mental health condition. This disorder affects feelings. It can also affect the body. Symptoms of this condition last most of the day, almost every day, for 2 weeks. This disorder can affect: °Relationships. °Daily activities, such as work and school. °Activities that you normally like to do. °What are the causes? °The cause of this condition is not known. The disorder is likely caused by a mix of things, including: °Your personality, such as being a shy person. °Your behavior, or how you act toward others. °Your thoughts and feelings. °Too much alcohol or drugs. °How you react to    stress. °Health and mental problems that you have had for a long time. °Things that hurt you in the past (trauma). °Big changes in your life, such as divorce. °What increases the risk? °The following factors may make you more likely to develop this condition: °Having family members with depression. °Being a woman. °Problems in the family. °Low levels of some brain chemicals. °Things that caused you pain as a child, especially if you lost a parent or were abused. °A lot of stress in your life, such as from: °Living without basic needs of life, such as food and shelter. °Being treated poorly because of race, sex, or religion (discrimination). °Health and mental problems that you have had for a long time. °What are the signs or symptoms? °The main symptoms of this condition are: °Being sad all the time. °Being grouchy all the time. °Loss of interest in things and activities. °Other symptoms include: °Sleeping too much or too little. °Eating too much or too little. °Gaining or losing weight, without knowing why. °Feeling tired or having low energy. °Being restless and weak. °Feeling hopeless, worthless, or guilty. °Trouble thinking clearly or making decisions. °Thoughts of hurting yourself or others, or thoughts of ending your life. °Spending a lot of time alone. °Inability to complete common tasks of daily life. °If you have very bad MDD, you may: °Believe things that are not true. °Hear, see, taste, or feel things that are not there. °Have mild depression that lasts for at least 2 years. °Feel very sad and hopeless. °Have trouble speaking or moving. °How is this treated? °This condition may be treated with: °Talk therapy. This teaches you to know bad thoughts, feelings, and actions and how to change them. °This can also help you to communicate with others. °This can be done with members of your family. °Medicines. These can be used to treat worry (anxiety), depression, or low levels of chemicals in the  brain. °Lifestyle changes. You may need to: °Limit alcohol use. °Limit drug use. °Get regular exercise. °Get plenty of sleep. °Make healthy eating choices. °Spend more time outdoors. °Brain stimulation. This treatment excites the brain. This is done when symptoms are very bad or have not gotten better with other treatments. °Follow these instructions at home: °Activity °Get regular exercise as told. °Spend time outdoors as told. °Make time to do the things you enjoy. °Find ways to deal with stress. Try to: °Meditate. °Do deep breathing. °Spend time in nature. °Keep a journal. °Return to your normal activities as told by your doctor. Ask your doctor what activities are safe for you. °Alcohol and drug use °If you drink alcohol: °Limit how much you use to: °0-1 drink a day for women. °0-2 drinks a day for men. °Be aware of how much alcohol is in your drink. In the [place] one drink equals one 12 oz bottle of beer (355 mL), one 5 oz glass of wine (148 mL), or one 1½ oz glass of hard liquor (44 mL). °Talk to your doctor about: °Alcohol use. Alcohol can affect some medicines. °Any drug use. °General instructions ° °Take over-the-counter and prescription medicines and herbal preparations only as told by your doctor. °Eat a healthy diet. °Get a lot of sleep. °Think about joining a support group. Your doctor may be able to suggest one. °Keep all follow-up visits as told by your doctor. This is important. °Where to find more information: °National Alliance on Mental Illness: www.nami.org °[place] National Institute of Mental Health: www.nimh.nih.gov °American Psychiatric Association: www.psychiatry.org/patients-families/ °Contact   a doctor if: °Your symptoms get worse. °You get new symptoms. °Get help right away if: °You hurt yourself. °You have serious thoughts about hurting yourself or others. °You see, hear, taste, smell, or feel things that are not there. °If you ever feel like you may hurt yourself or others, or have thoughts  about taking your own life, get help right away. Go to your nearest emergency department or: °Call your local emergency services (911 in the [place] °Call a suicide crisis helpline, such as the National Suicide Prevention Lifeline at 1-800-273-8255 or 988 in the [place] This is open 24 hours a day in the [place] °Text the Crisis Text Line at 741741 (in the [place] °Summary °Major depressive disorder is a mental health condition. This disorder affects feelings. Symptoms of this condition last most of the day, almost every day, for 2 weeks. °The symptoms of this disorder can cause problems with relationships and with daily activities. °There are treatments and support for people who get this disorder. You may need more than one type of treatment. °Get help right away if you have serious thoughts about hurting yourself or others. °This information is not intended to replace advice given to you by your health care provider. Make sure you discuss any questions you have with your health care provider. °Document Revised: 12/11/2020 Document Reviewed: 04/29/2019 °Elsevier Patient Education © 2022 [employer] Inc. ° °

## 2021-08-05 ENCOUNTER — Telehealth: Payer: Self-pay | Admitting: Family Medicine

## 2021-08-05 ENCOUNTER — Other Ambulatory Visit: Payer: Self-pay | Admitting: Nurse Practitioner

## 2021-08-05 MED ORDER — BUSPIRONE HCL 5 MG PO TABS: 5.0000 mg | ORAL_TABLET | Freq: Two times a day (BID) | ORAL | 1 refills | Status: DC

## 2021-08-05 NOTE — Telephone Encounter (Signed)
Patient aware and verbalizes understanding. 

## 2021-08-05 NOTE — Telephone Encounter (Signed)
Patient was seen on 2/27 for anxiety by Je because [facility] was booked and the medicine that she was given is no longer helping. She said that she felt that it helped for around 5 days and then it stopped. Is there something else that she could take for this? Please call back and advise.  ?

## 2021-08-05 NOTE — Telephone Encounter (Signed)
Appointment with JE

## 2021-08-05 NOTE — Telephone Encounter (Signed)
Have patient d/c hydroxyzine and start Buspar follow up with your PCP in 6 weeks ?

## 2021-08-19 ENCOUNTER — Other Ambulatory Visit: Payer: Self-pay | Admitting: Nurse Practitioner

## 2021-08-19 DIAGNOSIS — F332 Major depressive disorder, recurrent severe without psychotic features: Secondary | ICD-10-CM

## 2021-08-28 ENCOUNTER — Other Ambulatory Visit: Payer: Self-pay | Admitting: Nurse Practitioner

## 2021-09-09 ENCOUNTER — Encounter: Payer: Self-pay | Admitting: Family Medicine

## 2021-09-09 ENCOUNTER — Ambulatory Visit (INDEPENDENT_AMBULATORY_CARE_PROVIDER_SITE_OTHER): Payer: Medicaid Other | Admitting: Family Medicine

## 2021-09-09 DIAGNOSIS — F332 Major depressive disorder, recurrent severe without psychotic features: Secondary | ICD-10-CM

## 2021-09-09 MED ORDER — VENLAFAXINE HCL ER 150 MG PO CP24: 150.0000 mg | ORAL_CAPSULE | Freq: Two times a day (BID) | ORAL | 1 refills | Status: DC

## 2021-09-09 MED ORDER — BUSPIRONE HCL 10 MG PO TABS: 10.0000 mg | ORAL_TABLET | Freq: Two times a day (BID) | ORAL | 0 refills | Status: DC

## 2021-09-09 NOTE — Progress Notes (Signed)
? ? ?Subjective:  ? ? Patient ID: Gina Hendricks, female    DOB: Apr 29, 1961, 61 y.o.   MRN: [id number] ? ? ?HPI: ?Gina Hendricks is a 61 y.o. female presenting for feeling completely depressed for a long long time. "I can't get out of this hole I'm in. My mind races all the time. "  Has a problem leaving home. Has only left home once in a year. No motivation or self esteem. Has a hard time taking a shower. Isolated. Couldn't even make herself get out to go to her mother's funeral.  ? ? ? ?  07/28/2021  ?  9:13 AM 02/04/2020  ?  6:37 PM 11/30/2019  ? 10:32 AM 01/10/2018  ? 10:00 AM 08/09/2017  ?  2:39 PM  ?Depression screen PHQ 2/9  ?Decreased Interest 3 2 1 1  0  ?Down, Depressed, Hopeless 3 2 1 1  0  ?PHQ - 2 Score 6 4 2 2  0  ?Altered sleeping 3 2 1 2    ?Tired, decreased energy 2 2 3 2    ?Change in appetite 2 2 1 2    ?Feeling bad or failure about yourself  2 2 1 2    ?Trouble concentrating 3 1 0 0   ?Moving slowly or fidgety/restless 2 2 0 1   ?Suicidal thoughts 2 2 0 0   ?PHQ-9 Score 22 17 8 11    ?Difficult doing work/chores Very difficult Very difficult Extremely dIfficult Somewhat difficult   ? ? ? ?Relevant past medical, surgical, family and social history reviewed and updated as indicated.  ?Interim medical history since our last visit reviewed. ?Allergies and medications reviewed and updated. ? ?ROS:  ?Review of Systems  ?Constitutional:  Negative for fever.  ?HENT:  Negative for congestion, rhinorrhea and sore throat.   ?Respiratory:  Negative for cough and shortness of breath.   ?Cardiovascular:  Negative for chest pain and palpitations.  ?Gastrointestinal:  Negative for abdominal pain.  ?Psychiatric/Behavioral:  Positive for agitation, decreased concentration and dysphoric mood. The patient is nervous/anxious.    ? ?Social History  ? ?Tobacco Use  ?Smoking Status Every Day  ? Packs/day: 1.00  ? Types: Cigarettes  ? Start date: 08/24/1980  ?Smokeless Tobacco Never  ? ? ?   ?Objective:  ?  ? ?Wt Readings from Last 3  Encounters:  ?07/28/21 103 lb (46.7 kg)  ?11/30/19 112 lb (50.8 kg)  ?01/10/18 122 lb 6.4 oz (55.5 kg)  ?  ? ?Exam deferred. Pt. [place] due to COVID 19. Phone visit performed.  ? ?Assessment & Plan:  ? ?1. Severe episode of recurrent major depressive disorder, without psychotic features ([place])   ? ? ?Meds ordered this encounter  ?Medications  ? venlafaxine XR (EFFEXOR-XR) 150 MG 24 hr capsule  ?  Sig: Take 1 capsule (150 mg total) by mouth 2 (two) times daily.  ?  Dispense:  180 capsule  ?  Refill:  1  ? busPIRone (BUSPAR) 10 MG tablet  ?  Sig: Take 1 tablet (10 mg total) by mouth 2 (two) times daily.  ?  Dispense:  180 tablet  ?  Refill:  0  ? ? ?Orders Placed This Encounter  ?Procedures  ? AMB Referral to [facility]  ?  Referral Priority:   Routine  ?  Referral Type:   Consultation  ?  Referral Reason:   Care Coordination  ?  Number of Visits Requested:   1  ? ?  ? ?Diagnoses and all orders for this  visit: ? ?Severe episode of recurrent major depressive disorder, without psychotic features ([place]) ?-     venlafaxine XR (EFFEXOR-XR) 150 MG 24 hr capsule; Take 1 capsule (150 mg total) by mouth 2 (two) times daily. ?-     AMB Referral to [facility] ? ?Other orders ?-     busPIRone (BUSPAR) 10 MG tablet; Take 1 tablet (10 mg total) by mouth 2 (two) times daily. ? ? ? ?Virtual Visit via telephone Note ? ?I discussed the limitations, risks, security and privacy concerns of performing an evaluation and management service by telephone and the availability of in person appointments. The patient was identified with two identifiers. Pt.expressed understanding and agreed to proceed. Pt. Is at home. Dr. Livia Snellen is in his office. ? ?Follow Up Instructions: ?  ?I discussed the assessment and treatment plan with the patient. The patient was provided an opportunity to ask questions and all were answered. The patient agreed with the plan and demonstrated an understanding of the instructions. ?  ?The  patient was advised to call back or seek an in-person evaluation if the symptoms worsen or if the condition fails to improve as anticipated. ? ? ?Total minutes including chart review and phone contact time: 21 ? ? ?Follow up plan: ?Return in about 1 month (around 10/09/2021). ? ?Claretta Fraise, MD ?[place] ? ?

## 2021-09-10 ENCOUNTER — Other Ambulatory Visit: Payer: Self-pay | Admitting: Licensed Clinical Social Worker

## 2021-09-10 NOTE — Patient Instructions (Signed)
Visit Information ? ?Gina Hendricks was given information about Medicaid Managed Care team care coordination services as a part of their [facility] Community Plan Medicaid benefit. Gina Hendricks verbally consented to engagement with the [facility] Managed Care team.  ? ?If you are experiencing a medical emergency, please call 911 or report to your local emergency department or urgent care.  ? ?If you have a non-emergency medical problem during routine business hours, please contact your provider's office and ask to speak with a nurse.  ? ?For questions related to your [facility], please call: [phone] or visit the homepage here: [email or web address] ? ?If you would like to schedule transportation through your [facility], please call the following number at least 2 days in advance of your appointment: [phone]. ? Rides for urgent appointments can also be made after hours by calling Member Services. ? ?Call the Behavioral Health Crisis Line at [phone], at any time, 24 hours a day, 7 days a week. If you are in danger or need immediate medical attention call 911. ? ?If you would like help to quit smoking, call 1-800-QUIT-NOW (838-738-4756) OR Espa?ol: 1-855-D?jelo-Ya 303 623 3291) o para m?s informaci?n haga clic aqu? or Text READY to 200-400 to register via text ? ?Following is a copy of your plan of care:  ?Care Plan : LCSW Plan of Care  ?Updates made by Gustavus Bryant, LCSW since 09/10/2021 12:00 AM  ?  ? ?Problem: Depression Identification (Depression)   ?  ? ?Problem: Symptoms (Depression)   ?  ? ?Problem: I want to work on my depression and anxiety because I cannot carry out daily task now because of it   ?Priority: High  ?Onset Date: 09/10/2021  ?Note:   ?Priority: High ? ?Timeframe:  Long-Range Goal ?Priority:  High ?Start Date:   09/10/21            ?Expected End Date:  ongoing                    ?  ?Follow Up Date--10/13/21 ? ?- check out counseling ?- keep 90 percent of counseling appointments ?- schedule counseling appointment  ?  ?Why is this important?   ?          Beating depression may take some time.  ?          If you don't feel better right away, don't give up on your treatment plan.  ? ? Current barriers:   ?Chronic Mental Health needs related to depression, stress and anxiety. Patient requires Support, Education, Resources, Referrals, Advocacy, and Care Coordination, in order to meet Unmet Mental Health Needs. ?Patient will implement clinical interventions discussed today to decrease symptoms of depression and increase knowledge and/or ability of: coping skills. ?Mental Health Concerns and Social Isolation ?Inability to carry out daily task because of mental health issues ?Patient lacks knowledge of available community counseling agencies and resources. ? ?Clinical Goal(s): verbalize understanding of plan for management of Anxiety, Depression, and Stress and demonstrate a reduction in symptoms. Patient will connect with a provider for ongoing mental health treatment, increase coping skills, healthy habits, self-management skills, and stress reduction     ?   ?Patient Goals/Self-Care Activities: Over the next 120 days ?Attend scheduled medical appointments ?Utilize healthy coping skills and supportive resources discussed ?Contact PCP with any questions or concerns ?Keep 90 percent of counseling appointments ?Call your insurance provider for more information about your Enhanced Benefits  ?Check out  counseling resources provided  ?Begin personal counseling with LCSW, to reduce and manage symptoms of Depression and Stress, until well-established with mental health provider ?Incorporate into daily practice - relaxation techniques, deep breathing exercises, and mindfulness meditation strategies. ?Talk about feelings with friends, family members, higher power, spiritual advisor, etc. ?Contact LCSW  directly [phone]), if you have questions, need assistance, or if additional social work needs are identified between now and our next scheduled telephone outreach call. ?Call 988 for mental health hotline/crisis line if needed (24/7 available) ?Try techniques to reduce symptoms of anxiety/negative thinking (deep breathing, distraction, positive self talk, etc)  ?- develop a personal safety plan ?- develop a plan to deal with triggers like holidays, anniversaries ?- exercise at least 2 to 3 times per week ?- have a plan for how to handle bad days ?- journal feelings and what helps to feel better or worse ?- spend time or talk with others at least 2 to 3 times per week ?- watch for early signs of feeling worse ?- begin personal counseling ?- call and visit an old friend ?- check out volunteer opportunities ?- join a support group ?- laugh; watch a funny movie or comedian ?- learn and use visualization or guided imagery ?- perform a random act of kindness ?- practice relaxation or meditation daily ?- start or continue a personal journal ?- practice positive thinking and self-talk ?-continue with compliance of taking medication  ? ?Patient Goals: Initial goal ? ?  ? ? ?  ?24- Hour Availability:  ?  ?[facility]  ?[address] [place], [place] ?[place] [phone] ?Crisis [phone] ?  ?[facility] [phone] ?  ?[employer] Crisis [facility]  [phone]  ?  ?[facility] [employer]  [phone] (after hours) ?  ?Therapeutic Alternative/Mobile Crisis   [phone] ?  ?[place] National Suicide Hotline  905-241-0923 (TALK) OR 988 ?  ?Call 911 or go to emergency room ?  ?[employer]  [phone]);  [place] and [place]  ?  ?[facility]  ?([phone]); [place], [place], [place], [place], [place], [place], [place] ?  ? ?  ?  ? ?Dickie La, BSW, MSW, LCSW ?Managed Medicaid LCSW ?[place]  [facility] ?[email or web address] ?Phone: [phone] ? ? ?

## 2021-09-10 NOTE — Patient Outreach (Signed)
Medicaid Managed Care Social Work Note  09/10/2021 Name:  Gina Hendricks MRN:  [id number] DOB:  Jun 03, 1960  Gina Hendricks is an 61 y.o. year old female who is a primary patient of Stacks, Broadus John, MD.  The Medicaid Managed Care Coordination team was consulted for assistance with:  Mental Health Counseling and Resources  Ms. Sadowski was given information about Medicaid Managed Care Coordination team services today. Gina Hendricks Patient agreed to services and verbal consent obtained.  Engaged with patient  for by telephone forinitial visit in response to referral for case management and/or care coordination services.   Assessments/Interventions:  Review of past medical history, allergies, medications, health status, including review of consultants reports, laboratory and other test data, was performed as part of comprehensive evaluation and provision of chronic care management services.  SDOH: (Social Determinant of Health) assessments and interventions performed: SDOH Interventions    Flowsheet Row Most Recent Value  SDOH Interventions   Depression Interventions/Treatment  Counseling       Advanced Directives Status:  See Care Plan for related entries.  Care Plan                 Allergies  Allergen Reactions   Zoloft [Sertraline Hcl] Other (See Comments)    "out of body experience"    Medications Reviewed Today     Reviewed by Gustavus Bryant, LCSW (Social Worker) on 09/10/21 at 1038  Med List Status: <None>   Medication Order Taking? Sig Documenting Provider Last Dose Status Informant  busPIRone (BUSPAR) 10 MG tablet [id number]  Take 1 tablet (10 mg total) by mouth 2 (two) times daily. Mechele Claude, MD  Active   ondansetron [facility]) 4 MG tablet [id number]  Take 1 tablet (4 mg total) by mouth every 8 (eight) hours as needed for nausea or vomiting. Daryll Drown, NP  Active   venlafaxine XR (EFFEXOR-XR) 150 MG 24 hr capsule [id number]  Take 1 capsule (150 mg total) by mouth  2 (two) times daily. 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/22/2020   Severe episode of recurrent major depressive disorder, without psychotic features (HCC)    Sedative dependence (HCC)    Depression, major, single episode, moderate (HCC) 12/01/2019   Right hip pain 12/01/2019    Conditions to be addressed/monitored per PCP order:  Anxiety and Depression  Care Plan : LCSW Plan of Care  Updates made by Gustavus Bryant, LCSW since 09/10/2021 12:00 AM     Problem: Depression Identification (Depression)      Problem: Symptoms (Depression)      Problem: I want to work on my depression and anxiety because I cannot carry out daily task now because of it   Priority: High  Onset Date: 09/10/2021  Note:   Priority: High  Timeframe:  Long-Range Goal Priority:  High Start Date:   09/10/21            Expected End Date:  ongoing                     Follow Up Date--10/13/21  - check out counseling - keep 90 percent of counseling appointments - schedule counseling appointment    Why is this important?  Beating depression may take some time.            If you don't feel better right away, don't give up on your treatment plan.    Current barriers:   Chronic Mental Health needs related to depression, stress and anxiety. Patient requires Support, Education, Resources, Referrals, Advocacy, and Care Coordination, in order to meet Unmet Mental Health Needs. Patient will implement clinical interventions discussed today to decrease symptoms of depression and increase knowledge and/or ability of: coping skills. Mental Health Concerns and Social Isolation Inability to carry out daily task because of mental health issues Patient lacks knowledge of available  community counseling agencies and resources.  Clinical Goal(s): verbalize understanding of plan for management of Anxiety, Depression, and Stress and demonstrate a reduction in symptoms. Patient will connect with a provider for ongoing mental health treatment, increase coping skills, healthy habits, self-management skills, and stress reduction        Clinical Interventions:  Assessed patient's previous and current treatment, coping skills, support system and barriers to care.  Patient reports that her only source of support is her friend that comes once a week to check on her and provide cigarettes.  Patient reports that she is fearful to leave her house because she is scared of "everything." She reports that she use to be active in church and still maintains a strong relationship with her higher power but is unable to gain communion by attending church activities because she cannot leave her house.   Verbalization of feelings encouraged, motivational interviewing employed Patient provided history. She has had 3 past suicide attempts. She denies being suicidal at this time but does feel hopeless. She was educated on the following crisis resources-     24- Hour Availability:    [facility]  [address] [place], [place] [phone] Crisis [phone]   [facility] [phone]   [place] [facility]  [phone]    [facility]  [phone] (after hours)   Therapeutic Alternative/Mobile Crisis   [phone]   [place] National Suicide Hotline  (707)226-7222 [place] Child[place]) [place] [address]   Call 911 or go to emergency room   [facility]  [phone]);  [place] and [employer]  [phone]); [place], [place], [place], [place], [place], [place], [place]        Emotional support provided, positive coping strategies explored Self care/establishing healthy boundaries  emphasized Patient reports that she started psych medications 5-6 weeks ago and they have decreased her crying spells but she still is unable to function. Patient's PCP increased her psych medication dosage yesterday during office visit.  Patient reports significant worsening anxiety impacting her ability to function appropriately and carry out daily task. Patient reports that she easily gets overwhelmed and prefers to start counseling first to alleviate her mental health symptoms. Patient was encouraged to contact [facility] counseling center today to schedule initiate appointment for virtual therapy. Patient agreeable to do so. Contact information provided.  LCSW provided education on relaxation techniques such as meditation, deep breathing, massage, grounding exercises or yoga that can activate the body's relaxation response and ease symptoms of stress and anxiety. LCSW ask that when pt is struggling with difficult emotions and racing thoughts that they start this relaxation response process. LCSW provided extensive education on healthy coping skills for anxiety. SW used active and reflective listening, validated patient's feelings/concerns, and provided emotional support. Patient will work on implementing appropriate self-care  habits into their daily routine such as: staying positive, writing a gratitude list, drinking water, staying active around the house, taking their medications as prescribed, combating negative thoughts or emotions and staying connected with their family and friends. Positive reinforcement provided for this decision to work on this. Patient reports that they experience both anxiety and depression. She shares that she feels alone most days and that she has no hope of getting better. She was receptive to anxiety and depression management coping skill education.  Motivational Interviewing employed Depression screen reviewed  PHQ2/ PHQ9 completed Mindfulness or Relaxation  training provided Active listening / Reflection utilized  Advance Care and HCPOA education provided Emotional Support Provided Problem Solving /Task Center strategies reviewed Provided psychoeducation for mental health needs  Provided brief CBT  Reviewed mental health medications and discussed importance of compliance:  Quality of sleep assessed & Sleep Hygiene techniques promoted  Participation in counseling encouraged  Verbalization of feelings encouraged  Suicidal Ideation/Homicidal Ideation assessed: Patient denies SI/HI  Review resources, discussed options and provided patient information about  Mental Health Resources Inter-disciplinary care team collaboration (see longitudinal plan of care) Patient Goals/Self-Care Activities: Over the next 120 days Attend scheduled medical appointments Utilize healthy coping skills and supportive resources discussed Contact PCP with any questions or concerns Keep 90 percent of counseling appointments Call your insurance provider for more information about your Enhanced Benefits  Check out counseling resources provided  Begin personal counseling with LCSW, to reduce and manage symptoms of Depression and Stress, until well-established with mental health provider Incorporate into daily practice - relaxation techniques, deep breathing exercises, and mindfulness meditation strategies. Talk about feelings with friends, family members, higher power, spiritual advisor, etc. Contact LCSW directly [phone]), if you have questions, need assistance, or if additional social work needs are identified between now and our next scheduled telephone outreach call. Call 988 for mental health hotline/crisis line if needed (24/7 available) Try techniques to reduce symptoms of anxiety/negative thinking (deep breathing, distraction, positive self talk, etc)  - develop a personal safety plan - develop a plan to deal with triggers like holidays, anniversaries -  exercise at least 2 to 3 times per week - have a plan for how to handle bad days - journal feelings and what helps to feel better or worse - spend time or talk with others at least 2 to 3 times per week - watch for early signs of feeling worse - begin personal counseling - call and visit an old friend - check out volunteer opportunities - join a support group - laugh; watch a funny movie or comedian - learn and use visualization or guided imagery - perform a random act of kindness - practice relaxation or meditation daily - start or continue a personal journal - practice positive thinking and self-talk -continue with compliance of taking medication   Patient Goals: Initial goal      Follow up:  Patient agrees to Care Plan and Follow-up.  Plan: The Managed Medicaid care management team will reach out to the patient again over the next 35 days.  Date/time of next scheduled Social Work care management/care coordination outreach:  10/13/21 at 1:00 pm  Dickie La, BSW, MSW, [employer] Managed Medicaid LCSW [facility]  [facility] [email or web address] Phone: [phone]

## 2021-10-03 ENCOUNTER — Other Ambulatory Visit: Payer: Self-pay | Admitting: Family Medicine

## 2021-10-13 ENCOUNTER — Other Ambulatory Visit: Payer: Self-pay | Admitting: Licensed Clinical Social Worker

## 2021-10-13 NOTE — Patient Outreach (Signed)
Medicaid Managed Care Social Work Note  10/13/2021 Name:  Gina Hendricks MRN:  [id number] DOB:  25-Aug-1960  Gina Hendricks is an 61 y.o. year old female who is a primary patient of Stacks, Broadus John, MD.  The Medicaid Managed Care Coordination team was consulted for assistance with:  Mental Health Counseling and Resources  Ms. Grosz was given information about Medicaid Managed Care Coordination team services today. Gina Hendricks Patient agreed to services and verbal consent obtained.  Engaged with patient  for by telephone forfollow up visit in response to referral for case management and/or care coordination services.   Assessments/Interventions:  Review of past medical history, allergies, medications, health status, including review of consultants reports, laboratory and other test data, was performed as part of comprehensive evaluation and provision of chronic care management services.  SDOH: (Social Determinant of Health) assessments and interventions performed: SDOH Interventions    Flowsheet Row Most Recent Value  SDOH Interventions   Stress Interventions Provide Counseling, Offered [employer] Resources       Advanced Directives Status:  See Care Plan for related entries.  Care Plan                 Allergies  Allergen Reactions   Zoloft [Sertraline Hcl] Other (See Comments)    "out of body experience"    Medications Reviewed Today     Reviewed by Gustavus Bryant, LCSW (Social Worker) on 09/10/21 at 1038  Med List Status: <None>   Medication Order Taking? Sig Documenting Provider Last Dose Status Informant  busPIRone (BUSPAR) 10 MG tablet [id number]  Take 1 tablet (10 mg total) by mouth 2 (two) times daily. Mechele Claude, MD  Active   ondansetron [facility]) 4 MG tablet [id number]  Take 1 tablet (4 mg total) by mouth every 8 (eight) hours as needed for nausea or vomiting. Daryll Drown, NP  Active   venlafaxine XR (EFFEXOR-XR) 150 MG 24 hr capsule [id number]  Take 1  capsule (150 mg total) by mouth 2 (two) times daily. 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/22/2020   Severe episode of recurrent major depressive disorder, without psychotic features (HCC)    Sedative dependence (HCC)    Depression, major, single episode, moderate (HCC) 12/01/2019   Right hip pain 12/01/2019    Conditions to be addressed/monitored per PCP order:  Anxiety and Depression  Care Plan : LCSW Plan of Care  Updates made by Gustavus Bryant, LCSW since 10/13/2021 12:00 AM     Problem: I want to work on my depression and anxiety because I cannot carry out daily task now because of it   Priority: High  Onset Date: 09/10/2021  Note:   Priority: High  Timeframe:  Long-Range Goal Priority:  High Start Date:   09/10/21            Expected End Date:  ongoing                     Follow Up Date--10/30/21  - check out counseling - keep 90 percent of counseling appointments - schedule counseling appointment    Why is this important?             Beating depression  may take some time.            If you don't feel better right away, don't give up on your treatment plan.    Current barriers:   Chronic Mental Health needs related to depression, stress and anxiety. Patient requires Support, Education, Resources, Referrals, Advocacy, and Care Coordination, in order to meet Unmet Mental Health Needs. Patient will implement clinical interventions discussed today to decrease symptoms of depression and increase knowledge and/or ability of: coping skills. Mental Health Concerns and Social Isolation Inability to carry out daily task because of mental health issues Patient lacks knowledge of available community counseling agencies and resources.  Clinical Goal(s):  verbalize understanding of plan for management of Anxiety, Depression, and Stress and demonstrate a reduction in symptoms. Patient will connect with a provider for ongoing mental health treatment, increase coping skills, healthy habits, self-management skills, and stress reduction        Clinical Interventions:  Assessed patient's previous and current treatment, coping skills, support system and barriers to care.  Patient reports that her only source of support is her friend that comes once a week to check on her and provide cigarettes.  Patient reports that she is fearful to leave her house because she is scared of "everything." She reports that she use to be active in church and still maintains a strong relationship with her higher power but is unable to gain communion by attending church activities because she cannot leave her house.   Verbalization of feelings encouraged, motivational interviewing employed Patient provided history. She has had 3 past suicide attempts. She denies being suicidal at this time but does feel hopeless. She was educated on the following crisis resources-     24- Hour Availability:    [facility]  [address] [place], [place] [phone] Crisis [phone]   [facility] [phone]   [facility]  [phone]    [facility]  [phone] (after hours)   [facility]/Mobile Crisis   [phone]   [place] National Suicide Hotline  [phone] [place]) [place] 573   Call 911 or go to emergency room   [facility]  [phone]);  [place] and [employer]  [phone]); [place], [place], [place], [place], [place], [place], [place]        Emotional support provided, positive coping strategies explored Self care/establishing healthy boundaries emphasized Patient reports that she started psych medications 5-6 weeks ago  and they have decreased her crying spells but she still is unable to function. Patient's PCP increased her psych medication dosage yesterday during office visit.  Patient reports significant worsening anxiety impacting her ability to function appropriately and carry out daily task. Patient reports that she easily gets overwhelmed and prefers to start counseling first to alleviate her mental health symptoms. Patient was encouraged to contact [facility] counseling center today to schedule initiate appointment for virtual therapy. Patient agreeable to do so. Contact information provided.  LCSW provided education on relaxation techniques such as meditation, deep breathing, massage, grounding exercises or yoga that can activate the body's relaxation response and ease symptoms of stress and anxiety. LCSW ask that when pt is struggling with difficult emotions and racing thoughts that they start this relaxation response process. LCSW provided extensive education on healthy coping skills for anxiety. SW used active and reflective listening, validated patient's feelings/concerns, and provided emotional support. Patient will work on implementing appropriate self-care habits into  their daily routine such as: staying positive, writing a gratitude list, drinking water, staying active around the house, taking their medications as prescribed, combating negative thoughts or emotions and staying connected with their family and friends. Positive reinforcement provided for this decision to work on this. Patient reports that they experience both anxiety and depression. She shares that she feels alone most days and that she has no hope of getting better. She was receptive to anxiety and depression management coping skill education.  Motivational Interviewing employed Depression screen reviewed  PHQ2/ PHQ9 completed Mindfulness or Relaxation training provided Active listening / Reflection utilized  Advance Care and HCPOA  education provided Emotional Support Provided Problem Solving /Task Center strategies reviewed Provided psychoeducation for mental health needs  Provided brief CBT  Reviewed mental health medications and discussed importance of compliance:  Quality of sleep assessed & Sleep Hygiene techniques promoted  Participation in counseling encouraged  Verbalization of feelings encouraged  Suicidal Ideation/Homicidal Ideation assessed: Patient denies SI/HI  Review resources, discussed options and provided patient information about  Mental Health Resources Inter-disciplinary care team collaboration (see longitudinal plan of care) UPDATE 10/13/21- Patient reports that she has completed 4 counseling sessions but was not pleased with the progress she was making and decided to try and pursue counseling with better help. Patient said her counseling sessions was no with [facility] and is not sure who she had these counseling sessions with. [facility] LCSW only provided [facility] contact information during last outreach on 09/10/21. Patient is unable to name who she had 4 counseling sessions with and is now very worried about who she completed these services with. She has already deleted all of her recent calls and is unable to locate the number or state the counselor's name. [facility] LCSW completed call to [facility] counseling center and left a message inquiring if the therapist there perhaps worked with patient. Patient is adamant that it was not this agency though but is agreeable to contact them directly to double check as well. Patient wishes to pursue counseling with better help at this time but seems to have issues enrolling into their program. [facility] LCSW will follow up on 10/30/21 to see if patient was able to find a stable long term therapist. [facility] LCSW encouraged patient to consider crisis support resources if needed. [facility] LCSW also encouraged patient to contact her phone line service to gain information on the  counseling sessions she completed by phone.  Patient Goals/Self-Care Activities: Over the next 120 days Attend scheduled medical appointments Utilize healthy coping skills and supportive resources discussed Contact PCP with any questions or concerns Keep 90 percent of counseling appointments Call your insurance provider for more information about your Enhanced Benefits  Check out counseling resources provided  Begin personal counseling with LCSW, to reduce and manage symptoms of Depression and Stress, until well-established with mental health provider Incorporate into daily practice - relaxation techniques, deep breathing exercises, and mindfulness meditation strategies. Talk about feelings with friends, family members, higher power, spiritual advisor, etc. Contact LCSW directly [phone]), if you have questions, need assistance, or if additional social work needs are identified between now and our next scheduled telephone outreach call. Call 988 for mental health hotline/crisis line if needed (24/7 available) Try techniques to reduce symptoms of anxiety/negative thinking (deep breathing, distraction, positive self talk, etc)  - develop a personal safety plan - develop a plan to deal with triggers like holidays, anniversaries - exercise at least 2 to 3 times  per week - have a plan for how to handle bad days - journal feelings and what helps to feel better or worse - spend time or talk with others at least 2 to 3 times per week - watch for early signs of feeling worse - begin personal counseling - call and visit an old friend - check out volunteer opportunities - join a support group - laugh; watch a funny movie or comedian - learn and use visualization or guided imagery - perform a random act of kindness - practice relaxation or meditation daily - start or continue a personal journal - practice positive thinking and self-talk -continue with compliance of taking medication    Patient Goals: Follow up goal      Follow up:  Patient agrees to Care Plan and Follow-up.  Plan: The Managed Medicaid care management team will reach out to the patient again over the next 30 days.  Date/time of next scheduled Social Work care management/care coordination outreach:  10/30/21 at 1:00 pm.  Dickie La, BSW, MSW, LCSW Managed Medicaid LCSW [facility]  [facility] [email or web address] Phone: [phone]

## 2021-10-13 NOTE — Patient Instructions (Signed)
Visit Information ? ?Gina Hendricks was given information about Medicaid Managed Care team care coordination services as a part of their [facility] Community Plan Medicaid benefit. Gina Hendricks verbally consented to engagement with the [facility] Managed Care team.  ? ?If you are experiencing a medical emergency, please call 911 or report to your local emergency department or urgent care.  ? ?If you have a non-emergency medical problem during routine business hours, please contact your provider's office and ask to speak with a nurse.  ? ?For questions related to your [facility], please call: [phone] or visit the homepage here: [email or web address] ? ?If you would like to schedule transportation through your [facility], please call the following number at least 2 days in advance of your appointment: [phone]. ? Rides for urgent appointments can also be made after hours by calling Member Services. ? ?Call the Behavioral Health Crisis Line at [phone], at any time, 24 hours a day, 7 days a week. If you are in danger or need immediate medical attention call 911. ? ?If you would like help to quit smoking, call 1-800-QUIT-NOW (639-553-9082) OR Espa?ol: 1-855-D?jelo-Ya 408-270-0824) o para m?s informaci?n haga clic aqu? or Text READY to 200-400 to register via text ? ?Following is a copy of your plan of care:  ?Care Plan : LCSW Plan of Care  ?Updates made by Gina Bryant, LCSW since 10/13/2021 12:00 AM  ?  ? ?Problem: I want to work on my depression and anxiety because I cannot carry out daily task now because of it   ?Priority: High  ?Onset Date: 09/10/2021  ?Note:   ?Priority: High ? ?Timeframe:  Long-Range Goal ?Priority:  High ?Start Date:   09/10/21            ?Expected End Date:  ongoing                   ?  ?Follow Up Date--10/30/21 ? ?- check out counseling ?- keep 90 percent of counseling  appointments ?- schedule counseling appointment  ?  ?Why is this important?   ?          Beating depression may take some time.  ?          If you don't feel better right away, don't give up on your treatment plan.  ? ? Current barriers:   ?Chronic Mental Health needs related to depression, stress and anxiety. Patient requires Support, Education, Resources, Referrals, Advocacy, and Care Coordination, in order to meet Unmet Mental Health Needs. ?Patient will implement clinical interventions discussed today to decrease symptoms of depression and increase knowledge and/or ability of: coping skills. ?Mental Health Concerns and Social Isolation ?Inability to carry out daily task because of mental health issues ?Patient lacks knowledge of available community counseling agencies and resources. ? ?Clinical Goal(s): verbalize understanding of plan for management of Anxiety, Depression, and Stress and demonstrate a reduction in symptoms. Patient will connect with a provider for ongoing mental health treatment, increase coping skills, healthy habits, self-management skills, and stress reduction     ?   ?Patient Goals/Self-Care Activities: Over the next 120 days ?Attend scheduled medical appointments ?Utilize healthy coping skills and supportive resources discussed ?Contact PCP with any questions or concerns ?Keep 90 percent of counseling appointments ?Call your insurance provider for more information about your Enhanced Benefits  ?Check out counseling resources provided  ?Begin personal counseling with LCSW, to reduce and manage symptoms of Depression and  Stress, until well-established with mental health provider ?Incorporate into daily practice - relaxation techniques, deep breathing exercises, and mindfulness meditation strategies. ?Talk about feelings with friends, family members, higher power, spiritual advisor, etc. ?Contact LCSW directly [phone]), if you have questions, need assistance, or if additional social  work needs are identified between now and our next scheduled telephone outreach call. ?Call 988 for mental health hotline/crisis line if needed (24/7 available) ?Try techniques to reduce symptoms of anxiety/negative thinking (deep breathing, distraction, positive self talk, etc)  ?- develop a personal safety plan ?- develop a plan to deal with triggers like holidays, anniversaries ?- exercise at least 2 to 3 times per week ?- have a plan for how to handle bad days ?- journal feelings and what helps to feel better or worse ?- spend time or talk with others at least 2 to 3 times per week ?- watch for early signs of feeling worse ?- begin personal counseling ?- call and visit an old friend ?- check out volunteer opportunities ?- join a support group ?- laugh; watch a funny movie or comedian ?- learn and use visualization or guided imagery ?- perform a random act of kindness ?- practice relaxation or meditation daily ?- start or continue a personal journal ?- practice positive thinking and self-talk ?-continue with compliance of taking medication  ? ?Patient Goals: Follow up goal ? ?Gina Hendricks, BSW, MSW, LCSW ?Managed Medicaid LCSW ?[place]  [facility] ?[email or web address] ?Phone: [phone] ? ? ?  ?  ?

## 2021-10-24 ENCOUNTER — Telehealth: Payer: Self-pay | Admitting: Family Medicine

## 2021-10-24 NOTE — Telephone Encounter (Signed)
Pt aware - she will have to come here for labs  (She does not have HH)

## 2021-10-30 ENCOUNTER — Other Ambulatory Visit: Payer: Self-pay | Admitting: Licensed Clinical Social Worker

## 2021-10-30 NOTE — Patient Instructions (Signed)
Visit Information  Gina Hendricks was given information about Medicaid Managed Care team care coordination services as a part of their [facility] Community Plan Medicaid benefit. Gina Hendricks verbally consented to engagement with the [facility] Managed Care team.   If you are experiencing a medical emergency, please call 911 or report to your local emergency department or urgent care.   If you have a non-emergency medical problem during routine business hours, please contact your provider's office and ask to speak with a nurse.   For questions related to your [facility], please call: [phone] or visit the homepage here: [email or web address]  If you would like to schedule transportation through your [facility], please call the following number at least 2 days in advance of your appointment: [phone].  Rides for urgent appointments can also be made after hours by calling Member Services.  Call the Behavioral Health Crisis Line at [phone]) [phone], at any time, 24 hours a day, 7 days a week. If you are in danger or need immediate medical attention call 911.  If you would like help to quit smoking, call 1-800-QUIT-NOW (339-649-3495) OR Espaol: 1-855-Djelo-Ya   Following is a copy of your plan of care:  Care Plan : LCSW Plan of Care  Updates made by Gina Bryant, LCSW since 10/30/2021 12:00 AM     Problem: I want to work on my depression and anxiety because I cannot carry out daily task now because of it   Priority: High  Onset Date: 09/10/2021  Note:   Priority: High  Timeframe:  Long-Range Goal Priority:  High Start Date:   09/10/21            Expected End Date:  ongoing                     Follow Up Date--12/03/21 at 1:00 pm for discharge visit  - check out counseling - keep 90 percent of counseling appointments - schedule counseling appointment    Why is this  important?             Beating depression may take some time.            If you don't feel better right away, don't give up on your treatment plan.    Current barriers:   Chronic Mental Health needs related to depression, stress and anxiety. Patient requires Support, Education, Resources, Referrals, Advocacy, and Care Coordination, in order to meet Unmet Mental Health Needs. Patient will implement clinical interventions discussed today to decrease symptoms of depression and increase knowledge and/or ability of: coping skills. Mental Health Concerns and Social Isolation Inability to carry out daily task because of mental health issues Patient lacks knowledge of available community counseling agencies and resources.  Clinical Goal(s): verbalize understanding of plan for management of Anxiety, Depression, and Stress and demonstrate a reduction in symptoms. Patient will connect with a provider for ongoing mental health treatment, increase coping skills, healthy habits, self-management skills, and stress reduction      Patient Goals/Self-Care Activities: Over the next 120 days Attend scheduled medical appointments Utilize healthy coping skills and supportive resources discussed Contact PCP with any questions or concerns Keep 90 percent of counseling appointments Call your insurance provider for more information about your Enhanced Benefits  Check out counseling resources provided  Begin personal counseling with LCSW, to reduce and manage symptoms of Depression and Stress, until well-established with mental health provider Incorporate into daily practice -  relaxation techniques, deep breathing exercises, and mindfulness meditation strategies. Talk about feelings with friends, family members, higher power, spiritual advisor, etc. Contact LCSW directly [phone]), if you have questions, need assistance, or if additional social work needs are identified between now and our next scheduled telephone  outreach call. Call 988 for mental health hotline/crisis line if needed (24/7 available) Try techniques to reduce symptoms of anxiety/negative thinking (deep breathing, distraction, positive self talk, etc)  - develop a personal safety plan - develop a plan to deal with triggers like holidays, anniversaries - exercise at least 2 to 3 times per week - have a plan for how to handle bad days - journal feelings and what helps to feel better or worse - spend time or talk with others at least 2 to 3 times per week - watch for early signs of feeling worse - begin personal counseling - call and visit an old friend - check out volunteer opportunities - join a support group - laugh; watch a funny movie or comedian - learn and use visualization or guided imagery - perform a random act of kindness - practice relaxation or meditation daily - start or continue a personal journal - practice positive thinking and self-talk -continue with compliance of taking medication   Patient Goals: Follow up goal  Gina Hendricks, BSW, MSW, [employer] Managed Medicaid LCSW [facility]  [facility] [email or web address] Phone: [phone]

## 2021-10-30 NOTE — Patient Outreach (Addendum)
Medicaid Managed Care Social Work Note  10/30/2021 Name:  Gina Hendricks MRN:  [id number] DOB:  Oct 12, 1960  Gina Hendricks is an 61 y.o. year old female who is a primary patient of Stacks, Cletus Gash, MD.  The Medicaid Managed Care Coordination team was consulted for assistance with:  [facility]  Ms. Lathrop was given information about Medicaid Managed Care Coordination team services today. Gina Hendricks Patient agreed to services and verbal consent obtained.  Engaged with patient  for by telephone forfollow up visit in response to referral for case management and/or care coordination services.   Assessments/Interventions:  Review of past medical history, allergies, medications, health status, including review of consultants reports, laboratory and other test data, was performed as part of comprehensive evaluation and provision of chronic care management services.  SDOH: (Social Determinant of Health) assessments and interventions performed: SDOH Interventions    Flowsheet Row Most Recent Value  SDOH Interventions   Depression Interventions/Treatment  Medication, Counseling       Advanced Directives Status:  See Care Plan for related entries.  Care Plan                 Allergies  Allergen Reactions   Zoloft [Sertraline Hcl] Other (See Comments)    "out of body experience"    Medications Reviewed Today     Reviewed by Greg Cutter, LCSW (Social Worker) on 10/30/21 at [place] List Status: <None>   Medication Order Taking? Sig Documenting Provider Last Dose Status Informant  busPIRone (BUSPAR) 10 MG tablet [id number]  Take 1 tablet (10 mg total) by mouth 2 (two) times daily. Claretta Fraise, MD  Active   busPIRone (BUSPAR) 5 MG tablet [id number]  TAKE 1 TABLET BY MOUTH TWICE A Effie Shy, MD  Active   ondansetron (ZOFRAN) 4 MG tablet [id number]  Take 1 tablet (4 mg total) by mouth every 8 (eight) hours as needed for nausea or vomiting. Ivy Lynn, NP  Active   venlafaxine XR (EFFEXOR-XR) 150 MG 24 hr capsule [id number]  Take 1 capsule (150 mg total) by mouth 2 (two) times daily. 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[place])    Depression, major, single episode, moderate ([place]) 12/01/2019   Right hip pain 12/01/2019    Conditions to be addressed/monitored per PCP order:  Anxiety and Depression  Care Plan : LCSW Plan of Care  Updates made by Greg Cutter, LCSW since 10/30/2021 12:00 AM     Problem: I want to work on my depression and anxiety because I cannot carry out daily task now because of it   Priority: High  Onset Date: 09/10/2021  Note:   Priority: High  Timeframe:  Long-Range Goal Priority:  High Start Date:   09/10/21            Expected End Date:  ongoing                     Follow Up Date--12/03/21 at 1:00 pm for discharge visit  - check out counseling - keep 90 percent of counseling appointments -  schedule counseling appointment    Why is this important?             Beating depression may take some time.            If you don't feel better right away, don't give up on your treatment plan.    Current barriers:   Chronic Mental Health needs related to depression, stress and anxiety. Patient requires Support, Education, Resources, Referrals, Advocacy, and Care Coordination, in order to meet Unmet Mental Health Needs. Patient will implement clinical interventions discussed today to decrease symptoms of depression and increase knowledge and/or ability of: coping skills. Mental Health Concerns and Social Isolation Inability to carry out daily task because of mental  health issues Patient lacks knowledge of available community counseling agencies and resources.  Clinical Goal(s): verbalize understanding of plan for management of Anxiety, Depression, and Stress and demonstrate a reduction in symptoms. Patient will connect with a provider for ongoing mental health treatment, increase coping skills, healthy habits, self-management skills, and stress reduction        Clinical Interventions:  Assessed patient's previous and current treatment, coping skills, support system and barriers to care.  Patient reports that her only source of support is her friend that comes once a week to check on her and provide cigarettes.  Patient reports that she is fearful to leave her house because she is scared of "everything." She reports that she use to be active in church and still maintains a strong relationship with her higher power but is unable to gain communion by attending church activities because she cannot leave her house.   Verbalization of feelings encouraged, motivational interviewing employed Patient provided history. She has had 3 past suicide attempts. She denies being suicidal at this time but does feel hopeless. She was educated on the following crisis resources-  UPDATE 10/30/21- Patient denies SI/HI but reports no improvement in depressive symptoms.    24- Hour Availability:    [facility]  [address] [place], [place] [facility] [phone]   [facility] [employer] [place]  [phone]    [place]  [phone] (after hours)   [facility]/Mobile Crisis   [phone]   [place] National Suicide Hotline  [phone] [place] Monks) [place] 988   Call 911 or go to emergency room   [facility]  [phone]);  [place] and [employer]  [phone]); [place], [place], [place], [place], [place],  [place], [place]        Emotional support provided, positive coping strategies explored Self care/establishing healthy boundaries emphasized Patient reports that she started psych medications 5-6 weeks ago and they have decreased her crying spells but she still is unable to function. Patient's PCP increased her psych medication dosage yesterday during office visit.  Patient reports significant worsening anxiety impacting her ability to function appropriately and carry out daily task. Patient reports that she easily gets overwhelmed and prefers to start counseling first to alleviate her mental health symptoms. Patient was encouraged to contact [facility] today to schedule initiate appointment for virtual therapy. Patient agreeable to do so. Contact information provided.  LCSW provided education on relaxation techniques such as meditation, deep breathing, massage, grounding exercises or yoga that can activate the body's relaxation response and ease symptoms of stress and anxiety. LCSW ask that when pt is struggling with difficult emotions and racing thoughts that they start  this relaxation response process. LCSW provided extensive education on healthy coping skills for anxiety. SW used active and reflective listening, validated patient's feelings/concerns, and provided emotional support. Patient will work on implementing appropriate self-care habits into their daily routine such as: staying positive, writing a gratitude list, drinking water, staying active around the house, taking their medications as prescribed, combating negative thoughts or emotions and staying connected with their family and friends. Positive reinforcement provided for this decision to work on this. Patient reports that they experience both anxiety and depression. She shares that she feels alone most days and that she has no hope of getting better. She was receptive to anxiety and depression management coping skill  education.  Motivational Interviewing employed Depression screen reviewed  PHQ2/ PHQ9 completed Mindfulness or Relaxation training provided Active listening / Reflection utilized  Advance Care and HCPOA education provided Emotional Support Provided Problem [place] strategies reviewed Provided psychoeducation for mental health needs  Provided brief CBT  Reviewed mental health medications and discussed importance of compliance:  Quality of sleep assessed & Sleep Hygiene techniques promoted  Participation in counseling encouraged  Verbalization of feelings encouraged  Suicidal Ideation/Homicidal Ideation assessed: Patient denies SI/HI  Review resources, discussed options and provided patient information about  [place] care team collaboration (see longitudinal plan of care) UPDATE 10/13/21- Patient reports that she has completed 4 counseling sessions but was not pleased with the progress she was making and decided to try and pursue counseling with better help. Patient said her counseling sessions was no with [facility] and is not sure who she had these counseling sessions with. [facility] LCSW only provided [facility] contact information during last outreach on 09/10/21. Patient is unable to name who she had 4 counseling sessions with and is now very worried about who she completed these services with. She has already deleted all of her recent calls and is unable to locate the number or state the counselor's name. [facility] LCSW completed call to [facility] center and left a message inquiring if the therapist there perhaps worked with patient. Patient is adamant that it was not this agency though but is agreeable to contact them directly to double check as well. Patient wishes to pursue counseling with better help at this time but seems to have issues enrolling into their program. [facility] LCSW will follow up on 10/30/21 to see if patient was able to  find a stable long term therapist. [facility] LCSW encouraged patient to consider crisis support resources if needed. [facility] LCSW also encouraged patient to contact her phone line service to gain information on the counseling sessions she completed by phone. UPDATE 10/30/21- Patient and [facility] LCSW both talked to [facility] therapist and confirmed that the 4 sessions of counseling was completed by their agency as patient was not sure before. Patient has continued gaining therapy from this program and patient reports great rapport built with her current therapist. Patient shares that she likes her therapy sessions but still feels not improvement in depression. Patient use to go to [facility] and understands that this is the only psychiatry resource available in [place] that accepts Medicaid at this time. [facility] is not taking new patients at this time and [facility] in [place] does not have a psychiatry. [facility] LCSW will update PCP. Patient is aware that she will have to physically go there to enroll back into their services but reports not being  able to do this because of her mental state.   Patient Goals/Self-Care Activities: Over the next 120 days Attend scheduled medical appointments Utilize healthy coping skills and supportive resources discussed Contact PCP with any questions or concerns Keep 90 percent of counseling appointments Call your insurance provider for more information about your Enhanced Benefits  Check out counseling resources provided  Begin personal counseling with LCSW, to reduce and manage symptoms of Depression and Stress, until well-established with mental health provider Incorporate into daily practice - relaxation techniques, deep breathing exercises, and mindfulness meditation strategies. Talk about feelings with friends, family members, higher power, spiritual advisor, etc. Contact LCSW directly [phone]), if you have questions, need  assistance, or if additional social work needs are identified between now and our next scheduled telephone outreach call. Call 988 for mental health hotline/crisis line if needed (24/7 available) Try techniques to reduce symptoms of anxiety/negative thinking (deep breathing, distraction, positive self talk, etc)  - develop a personal safety plan - develop a plan to deal with triggers like holidays, anniversaries - exercise at least 2 to 3 times per week - have a plan for how to handle bad days - journal feelings and what helps to feel better or worse - spend time or talk with others at least 2 to 3 times per week - watch for early signs of feeling worse - begin personal counseling - call and visit an old friend - check out volunteer opportunities - join a support group - laugh; watch a funny movie or comedian - learn and use visualization or guided imagery - perform a random act of kindness - practice relaxation or meditation daily - start or continue a personal journal - practice positive thinking and self-talk -continue with compliance of taking medication   Patient Goals: Follow up goal      Follow up:  Patient agrees to Care Plan and Follow-up.  Plan: The Managed Medicaid care management team will reach out to the patient again over the next 45 days.  Date/time of next scheduled Social Work care management/care coordination outreach:  12/03/21 at 1:00 pm.  Eula Fried, BSW, MSW, [place] Medicaid LCSW [email or web address] Phone: [phone]

## 2021-11-26 ENCOUNTER — Other Ambulatory Visit: Payer: Self-pay | Admitting: Family Medicine

## 2021-11-26 DIAGNOSIS — F332 Major depressive disorder, recurrent severe without psychotic features: Secondary | ICD-10-CM

## 2021-12-03 ENCOUNTER — Other Ambulatory Visit: Payer: Self-pay | Admitting: Licensed Clinical Social Worker

## 2021-12-03 NOTE — Patient Outreach (Signed)
Medicaid Managed Care Social Work Note  12/03/2021 Name:  Gina Hendricks MRN:  [id number] DOB:  10/26/1960  Gina Hendricks is an 61 y.o. year old female who is a primary patient of Stacks, Broadus John, MD.  The Medicaid Managed Care Coordination team was consulted for assistance with:  Mental Health Counseling and Resources  Gina Hendricks was given information about Medicaid Managed Care Coordination team services today. Gina Hendricks Patient agreed to services and verbal consent obtained.  Engaged with patient  for by telephone forfollow up visit in response to referral for case management and/or care coordination services.   Assessments/Interventions:  Review of past medical history, allergies, medications, health status, including review of consultants reports, laboratory and other test data, was performed as part of comprehensive evaluation and provision of chronic care management services.  SDOH: (Social Determinant of Health) assessments and interventions performed:   Advanced Directives Status:  See Care Plan for related entries.  Care Plan                 Allergies  Allergen Reactions   Zoloft [Sertraline Hcl] Other (See Comments)    "out of body experience"    Medications Reviewed Today     Reviewed by Gustavus Bryant, LCSW (Social Worker) on 12/03/21 at 1210  Med List Status: <None>   Medication Order Taking? Sig Documenting Provider Last Dose Status Informant  busPIRone (BUSPAR) 10 MG tablet [id number]  Take 1 tablet (10 mg total) by mouth 2 (two) times daily. Mechele Claude, MD  Active   busPIRone (BUSPAR) 5 MG tablet [id number]  TAKE 1 TABLET BY MOUTH TWICE A Lowella Fairy, MD  Active   ondansetron (ZOFRAN) 4 MG tablet [id number]  Take 1 tablet (4 mg total) by mouth every 8 (eight) hours as needed for nausea or vomiting. Daryll Drown, NP  Active   venlafaxine XR (EFFEXOR-XR) 150 MG 24 hr capsule [id number]  Take 1 capsule (150 mg total) by mouth 2 (two) times daily.  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/22/2020   Severe episode of recurrent major depressive disorder, without psychotic features (HCC)    Sedative dependence (HCC)    Depression, major, single episode, moderate (HCC) 12/01/2019   Right hip pain 12/01/2019    Conditions to be addressed/monitored per PCP order:  Anxiety and Depression  Care Plan : LCSW Plan of Care  Updates made by Gustavus Bryant, LCSW since 12/03/2021 12:00 AM     Problem: I want to work on my depression and anxiety because I cannot carry out daily task now because of it   Priority: High  Onset Date: 09/10/2021  Note:   Priority: High  Timeframe:  Long-Range Goal Priority:  High Start Date:   09/10/21            Expected End Date:  12/03/21                  Follow Up Date--12/03/21 at 1:00 pm for discharge visit. Update- Patient is now actively involved with therapist with [facility].   - check out counseling - keep 90 percent of counseling appointments - schedule counseling appointment    Why is this important?  Beating depression may take some time.            If you don't feel better right away, don't give up on your treatment plan.    Current barriers:   Chronic Mental Health needs related to depression, stress and anxiety. Patient requires Support, Education, Resources, Referrals, Advocacy, and Care Coordination, in order to meet Unmet Mental Health Needs. Patient will implement clinical interventions discussed today to decrease symptoms of depression and increase knowledge and/or ability of: coping skills. Mental Health Concerns and Social Isolation Inability to carry out daily task because of mental health issues Patient lacks knowledge of available community  counseling agencies and resources.  Clinical Goal(s): verbalize understanding of plan for management of Anxiety, Depression, and Stress and demonstrate a reduction in symptoms. Patient will connect with a provider for ongoing mental health treatment, increase coping skills, healthy habits, self-management skills, and stress reduction        Clinical Interventions:  Assessed patient's previous and current treatment, coping skills, support system and barriers to care.  Patient reports that her only source of support is her friend that comes once a week to check on her and provide cigarettes.  Patient reports that she is fearful to leave her house because she is scared of "everything." She reports that she use to be active in church and still maintains a strong relationship with her higher power but is unable to gain communion by attending church activities because she cannot leave her house.   Verbalization of feelings encouraged, motivational interviewing employed Patient provided history. She has had 3 past suicide attempts. She denies being suicidal at this time but does feel hopeless. She was educated on the following crisis resources-  UPDATE 10/30/21- Patient denies SI/HI but reports no improvement in depressive symptoms.    24- Hour Availability:    [facility]  [address] [place], [place] [phone] Crisis [phone]   [facility] [phone]   [place] [facility]  [phone]    [facility]  [phone] (after hours)   [facility]/Mobile Crisis   [phone]   [place] National Suicide Hotline  9181628848 [place]) [place] 856   Call 911 or go to emergency room   [facility]  [phone]);  [place] and [employer]  [phone]); [place], [place], [place], [place], [place], [place], [place]        Emotional support provided, positive  coping strategies explored Self care/establishing healthy boundaries emphasized Patient reports that she started psych medications 5-6 weeks ago and they have decreased her crying spells but she still is unable to function. Patient's PCP increased her psych medication dosage yesterday during office visit.  Patient reports significant worsening anxiety impacting her ability to function appropriately and carry out daily task. Patient reports that she easily gets overwhelmed and prefers to start counseling first to alleviate her mental health symptoms. Patient was encouraged to contact [facility] counseling center today to schedule initiate appointment for virtual therapy. Patient agreeable to do so. Contact information provided.  LCSW provided education on relaxation techniques such as meditation, deep breathing, massage, grounding exercises or yoga that can activate the body's relaxation response and ease symptoms of stress and anxiety. LCSW ask that when pt is struggling with difficult emotions and racing thoughts that they start this relaxation response process. LCSW provided extensive education on healthy coping skills for anxiety. SW used active and reflective listening, validated patient's  feelings/concerns, and provided emotional support. Patient will work on implementing appropriate self-care habits into their daily routine such as: staying positive, writing a gratitude list, drinking water, staying active around the house, taking their medications as prescribed, combating negative thoughts or emotions and staying connected with their family and friends. Positive reinforcement provided for this decision to work on this. Patient reports that they experience both anxiety and depression. She shares that she feels alone most days and that she has no hope of getting better. She was receptive to anxiety and depression management coping skill education.  Motivational Interviewing employed Depression  screen reviewed  PHQ2/ PHQ9 completed Mindfulness or Relaxation training provided Active listening / Reflection utilized  Advance Care and HCPOA education provided Emotional Support Provided Problem Solving /Task Center strategies reviewed Provided psychoeducation for mental health needs  Provided brief CBT  Reviewed mental health medications and discussed importance of compliance:  Quality of sleep assessed & Sleep Hygiene techniques promoted  Participation in counseling encouraged  Verbalization of feelings encouraged  Suicidal Ideation/Homicidal Ideation assessed: Patient denies SI/HI  Review resources, discussed options and provided patient information about  Mental Health Resources Inter-disciplinary care team collaboration (see longitudinal plan of care) UPDATE 10/13/21- Patient reports that she has completed 4 counseling sessions but was not pleased with the progress she was making and decided to try and pursue counseling with better help. Patient said her counseling sessions was no with [facility] and is not sure who she had these counseling sessions with. [facility] LCSW only provided [facility] contact information during last outreach on 09/10/21. Patient is unable to name who she had 4 counseling sessions with and is now very worried about who she completed these services with. She has already deleted all of her recent calls and is unable to locate the number or state the counselor's name. [facility] LCSW completed call to [facility] counseling center and left a message inquiring if the therapist there perhaps worked with patient. Patient is adamant that it was not this agency though but is agreeable to contact them directly to double check as well. Patient wishes to pursue counseling with better help at this time but seems to have issues enrolling into their program. [facility] LCSW will follow up on 10/30/21 to see if patient was able to find a stable long term therapist. [facility] LCSW encouraged patient  to consider crisis support resources if needed. [facility] LCSW also encouraged patient to contact her phone line service to gain information on the counseling sessions she completed by phone. UPDATE 10/30/21- Patient and [facility] LCSW both talked to [facility] therapist and confirmed that the 4 sessions of counseling was completed by their agency as patient was not sure before. Patient has continued gaining therapy from this program and patient reports great rapport built with her current therapist. Patient shares that she likes her therapy sessions but still feels not improvement in depression. [facility] and understands that this is the only psychiatry resource available in [place] that accepts Medicaid at this time. [facility] is not taking new patients at this time and [facility] in [place] does not have a psychiatry. [facility] LCSW will update PCP. Patient is aware that she will have to physically go there to enroll back into their services but reports not being able to do this because of her mental state. NEW UPDATE 12/03/21 Patient is actively involved with individual therapy. This is performed  tele health. She does not wish to change her mental health provider. She declines psychiatry due to fear of having to go in public and sign up for services again in person. [facility] resource options were reviewed with her. Patient reports that if her symptoms get worse she will consider going to [facility] for psychiatry as this is the only psychiatry in her county that accepts her insurance at this time. [facility] LCSW will close case at this time as patient has now been connected to needed services. Patient reports that her friend comes to her residence once a week still to check in on her. [facility] LCSW encouraged patient to increase her support network as much as possible. Patient reports "some" improvement in her depression. [facility] LCSW will sign off at this time.    Patient Goals/Self-Care Activities: Over the next 120 days Attend scheduled medical appointments Utilize healthy coping skills and supportive resources discussed Contact PCP with any questions or concerns Keep 90 percent of counseling appointments Call your insurance provider for more information about your Enhanced Benefits  Check out counseling resources provided  Begin personal counseling with LCSW, to reduce and manage symptoms of Depression and Stress, until well-established with mental health provider Incorporate into daily practice - relaxation techniques, deep breathing exercises, and mindfulness meditation strategies. Talk about feelings with friends, family members, higher power, spiritual advisor, etc. Contact LCSW directly [phone]), if you have questions, need assistance, or if additional social work needs are identified between now and our next scheduled telephone outreach call. Call [phone] for mental health hotline/crisis line if needed (24/7 available) Try techniques to reduce symptoms of anxiety/negative thinking (deep breathing, distraction, positive self talk, etc)  - develop a personal safety plan - develop a plan to deal with triggers like holidays, anniversaries - exercise at least 2 to 3 times per week - have a plan for how to handle bad days - journal feelings and what helps to feel better or worse - spend time or talk with others at least 2 to 3 times per week - watch for early signs of feeling worse - begin personal counseling - call and visit an old friend - check out volunteer opportunities - join a support group - laugh; watch a funny movie or comedian - learn and use visualization or guided imagery - perform a random act of kindness - practice relaxation or meditation daily - start or continue a personal journal - practice positive thinking and self-talk -continue with compliance of taking medication   Patient Goals: Follow up goal      Follow  up:  Patient requests no follow-up at this time.  Dickie La, BSW, MSW, [employer] Managed Medicaid LCSW [facility]  [facility] [email or web address] Phone: [phone]

## 2021-12-03 NOTE — Patient Instructions (Signed)
Visit Information  Ms. Hamor was given information about Medicaid Managed Care team care coordination services as a part of their [facility] Community Plan Medicaid benefit. [name] verbally consented to engagement with the [facility] Managed Care team.   If you are experiencing a medical emergency, please call 911 or report to your local emergency department or urgent care.   If you have a non-emergency medical problem during routine business hours, please contact your provider's office and ask to speak with a nurse.   For questions related to your [facility], please call: [phone] or visit the homepage here: [email or web address]  If you would like to schedule transportation through your [facility], please call the following number at least 2 days in advance of your appointment: [phone]   Rides for urgent appointments can also be made after hours by calling Member Services.  Call the Behavioral Health Crisis Line at [phone], at any time, 24 hours a day, 7 days a week. If you are in danger or need immediate medical attention call 911.  If you would like help to quit smoking, call 1-800-QUIT-NOW (3103987506) OR Espaol: 1-855-Djelo-Ya (4-825-003-7048) o para ms informacin haga clic aqu or Text READY to 889-169 to register via text  Following is a copy of your plan of care:  Care Plan : LCSW Plan of Care  Updates made by Gustavus Bryant, LCSW since 12/03/2021 12:00 AM     Problem: I want to work on my depression and anxiety because I cannot carry out daily task now because of it   Priority: High  Onset Date: 09/10/2021  Note:   Priority: High  Timeframe:  Long-Range Goal Priority:  High Start Date:   09/10/21            Expected End Date:  12/03/21                  Follow Up Date--12/03/21 at 1:00 pm for discharge visit. Update- Patient is now actively  involved with therapist with [facility].   - check out counseling - keep 90 percent of counseling appointments - schedule counseling appointment    Why is this important?             Beating depression may take some time.            If you don't feel better right away, don't give up on your treatment plan.    Current barriers:   Chronic Mental Health needs related to depression, stress and anxiety. Patient requires Support, Education, Resources, Referrals, Advocacy, and Care Coordination, in order to meet Unmet Mental Health Needs. Patient will implement clinical interventions discussed today to decrease symptoms of depression and increase knowledge and/or ability of: coping skills. Mental Health Concerns and Social Isolation Inability to carry out daily task because of mental health issues Patient lacks knowledge of available community counseling agencies and resources.  Clinical Goal(s): verbalize understanding of plan for management of Anxiety, Depression, and Stress and demonstrate a reduction in symptoms. Patient will connect with a provider for ongoing mental health treatment, increase coping skills, healthy habits, self-management skills, and stress reduction        Patient Goals/Self-Care Activities: Over the next 120 days Attend scheduled medical appointments Utilize healthy coping skills and supportive resources discussed Contact PCP with any questions or concerns Keep 90 percent of counseling appointments Call your insurance provider for more information about your Enhanced Benefits  Check out  counseling resources provided  Begin personal counseling with LCSW, to reduce and manage symptoms of Depression and Stress, until well-established with mental health provider Incorporate into daily practice - relaxation techniques, deep breathing exercises, and mindfulness meditation strategies. Talk about feelings with friends, family members, higher power, spiritual advisor,  etc. Contact LCSW directly [phone]), if you have questions, need assistance, or if additional social work needs are identified between now and our next scheduled telephone outreach call. Call 988 for mental health hotline/crisis line if needed (24/7 available) Try techniques to reduce symptoms of anxiety/negative thinking (deep breathing, distraction, positive self talk, etc)  - develop a personal safety plan - develop a plan to deal with triggers like holidays, anniversaries - exercise at least 2 to 3 times per week - have a plan for how to handle bad days - journal feelings and what helps to feel better or worse - spend time or talk with others at least 2 to 3 times per week - watch for early signs of feeling worse - begin personal counseling - call and visit an old friend - check out volunteer opportunities - join a support group - laugh; watch a funny movie or comedian - learn and use visualization or guided imagery - perform a random act of kindness - practice relaxation or meditation daily - start or continue a personal journal - practice positive thinking and self-talk -continue with compliance of taking medication   Patient Goals: Follow up goal  Dickie La, BSW, MSW, [employer] Managed Medicaid LCSW [facility]  [facility] [email or web address] Phone: [phone]

## 2021-12-31 ENCOUNTER — Other Ambulatory Visit: Payer: Self-pay | Admitting: Family Medicine

## 2022-01-12 ENCOUNTER — Ambulatory Visit (INDEPENDENT_AMBULATORY_CARE_PROVIDER_SITE_OTHER): Payer: 59 | Admitting: Family Medicine

## 2022-01-12 ENCOUNTER — Encounter: Payer: Self-pay | Admitting: Family Medicine

## 2022-01-12 DIAGNOSIS — F332 Major depressive disorder, recurrent severe without psychotic features: Secondary | ICD-10-CM

## 2022-01-12 DIAGNOSIS — F411 Generalized anxiety disorder: Secondary | ICD-10-CM

## 2022-01-12 MED ORDER — BUSPIRONE HCL 10 MG PO TABS
10.0000 mg | ORAL_TABLET | Freq: Three times a day (TID) | ORAL | 0 refills | Status: DC
Start: 2022-01-12 — End: 2022-09-04

## 2022-01-12 MED ORDER — DULOXETINE HCL 60 MG PO CPEP: 120.0000 mg | ORAL_CAPSULE | Freq: Every day | ORAL | 5 refills | Status: DC

## 2022-01-12 MED ORDER — QUETIAPINE FUMARATE 25 MG PO TABS
25.0000 mg | ORAL_TABLET | Freq: Every day | ORAL | 0 refills | Status: DC
Start: 2022-01-12 — End: 2022-02-03

## 2022-01-12 NOTE — Progress Notes (Signed)
Subjective:    Patient ID: Gina Hendricks, female    DOB: 01-30-1961, 61 y.o.   MRN: [id number]   HPI: Gina Hendricks is a 61 y.o. female presenting for depression. Effexor stopped the ctying, but that's it. Staying anxious all the time. Mind, thoughts, won't slow dow. Can't control them. "Back to square one." Phone visits with therapists haven't been useful. Sx started in June. No Motivation.       01/12/2022    8:09 AM 12/03/2021   12:10 PM 10/30/2021    2:07 PM 09/10/2021   10:38 AM 07/28/2021    9:13 AM  Depression screen PHQ 2/9  Decreased Interest 3 2 3 3 3   Down, Depressed, Hopeless 3 3 3 3 3   PHQ - 2 Score 6 5 6 6 6   Altered sleeping 3  3 3 3   Tired, decreased energy 3  3 3 2   Change in appetite 3  2 1 2   Feeling bad or failure about yourself  3  3 2 2   Trouble concentrating 0  2  3  Moving slowly or fidgety/restless 3  0 0 2  Suicidal thoughts 0  0 0 2  PHQ-9 Score 21  19 15 22   Difficult doing work/chores Extremely dIfficult  Extremely dIfficult Extremely dIfficult Very difficult     Relevant past medical, surgical, family and social history reviewed and updated as indicated.  Interim medical history since our last visit reviewed. Allergies and medications reviewed and updated.  ROS:  Review of Systems  Constitutional: Negative.   HENT: Negative.    Eyes:  Negative for visual disturbance.  Respiratory:  Negative for shortness of breath.   Cardiovascular:  Positive for chest pain (occurring 2-3 times a day. Lasts 1.5 min. Left upper chest.).  Gastrointestinal:  Positive for abdominal pain (gas in lower stomach).  Musculoskeletal:  Positive for back pain (occaionally toward the shoulders.). Negative for arthralgias.  Psychiatric/Behavioral:  Positive for sleep disturbance.      Social History   Tobacco Use  Smoking Status Every Day   Packs/day: 1.00   Types: Cigarettes   Start date: 08/24/1980  Smokeless Tobacco Never       Objective:     Wt Readings  from Last 3 Encounters:  07/28/21 103 lb (46.7 kg)  11/30/19 112 lb (50.8 kg)  01/10/18 122 lb 6.4 oz (55.5 kg)     Exam deferred. Pt. Harboring due to COVID 19. Phone visit performed.   Assessment & Plan:   1. Severe episode of recurrent major depressive disorder, without psychotic features (HCC)   2. GAD (generalized anxiety disorder)     Meds ordered this encounter  Medications   busPIRone (BUSPAR) 10 MG tablet    Sig: Take 1 tablet (10 mg total) by mouth 3 (three) times daily.    Dispense:  180 tablet    Refill:  0   QUEtiapine (SEROQUEL) 25 MG tablet    Sig: Take 1 tablet (25 mg total) by mouth at bedtime.    Dispense:  30 tablet    Refill:  0   DULoxetine (CYMBALTA) 60 MG capsule    Sig: Take 2 capsules (120 mg total) by mouth daily.    Dispense:  60 capsule    Refill:  5    No orders of the defined types were placed in this encounter.     Diagnoses and all orders for this visit:  Severe episode of recurrent major depressive disorder, without psychotic features (  HCC)  GAD (generalized anxiety disorder)  Other orders -     busPIRone (BUSPAR) 10 MG tablet; Take 1 tablet (10 mg total) by mouth 3 (three) times daily. -     QUEtiapine (SEROQUEL) 25 MG tablet; Take 1 tablet (25 mg total) by mouth at bedtime. -     DULoxetine (CYMBALTA) 60 MG capsule; Take 2 capsules (120 mg total) by mouth daily.    Virtual Visit via telephone Note  I discussed the limitations, risks, security and privacy concerns of performing an evaluation and management service by telephone and the availability of in person appointments. The patient was identified with two identifiers. Pt.expressed understanding and agreed to proceed. Pt. Is at home. Dr. Darlyn Read is in his office.  Follow Up Instructions:   I discussed the assessment and treatment plan with the patient. The patient was provided an opportunity to ask questions and all were answered. The patient agreed with the plan and  demonstrated an understanding of the instructions.   The patient was advised to call back or seek an in-person evaluation if the symptoms worsen or if the condition fails to improve as anticipated.   Total minutes including chart review and phone contact time: 22   Follow up plan: Return in about 2 weeks (around 01/26/2022) for Depression.  Mechele Claude, MD [place] [facility]

## 2022-01-26 ENCOUNTER — Ambulatory Visit: Payer: 59 | Admitting: Family Medicine

## 2022-01-27 ENCOUNTER — Encounter: Payer: Self-pay | Admitting: Family Medicine

## 2022-01-28 ENCOUNTER — Other Ambulatory Visit: Payer: Self-pay | Admitting: Family Medicine

## 2022-01-29 ENCOUNTER — Telehealth: Payer: Self-pay | Admitting: Family Medicine

## 2022-01-29 ENCOUNTER — Other Ambulatory Visit: Payer: Self-pay | Admitting: *Deleted

## 2022-01-29 NOTE — Telephone Encounter (Signed)
TOO EARLY

## 2022-01-29 NOTE — Telephone Encounter (Signed)
  Prescription Request  01/29/2022  Is this a "Controlled Substance" medicine?   Have you seen your PCP in the last 2 weeks? Last seen 01/12/2022 pt has next appt on 02/17/2022  If YES, route message to pool  -  If NO, patient needs to be scheduled for appointment.  What is the name of the medication or equipment? QUEtiapine (SEROQUEL) 25 MG tablet   Have you contacted your pharmacy to request a refill? yes   Which pharmacy would you like this sent to? [facility]    Patient notified that their request is being sent to the clinical staff for review and that they should receive a response within 2 business days.

## 2022-02-03 ENCOUNTER — Other Ambulatory Visit: Payer: Self-pay | Admitting: Family Medicine

## 2022-02-03 NOTE — Telephone Encounter (Signed)
Last office visit 01/12/22 Last refill 01/12/22, #30, no refills Upcoming appointment 02/17/22

## 2022-02-04 ENCOUNTER — Other Ambulatory Visit: Payer: Self-pay | Admitting: Family Medicine

## 2022-02-17 ENCOUNTER — Ambulatory Visit: Payer: 59 | Admitting: Family Medicine

## 2022-03-05 ENCOUNTER — Ambulatory Visit: Payer: 59 | Admitting: Family Medicine

## 2022-03-16 ENCOUNTER — Encounter: Payer: Self-pay | Admitting: Family Medicine

## 2022-03-16 ENCOUNTER — Ambulatory Visit (INDEPENDENT_AMBULATORY_CARE_PROVIDER_SITE_OTHER): Payer: Medicare Other | Admitting: Family Medicine

## 2022-03-16 VITALS — BP 124/82 | HR 72 | Temp 98.1°F | Ht 59.0 in | Wt 118.0 lb

## 2022-03-16 DIAGNOSIS — F332 Major depressive disorder, recurrent severe without psychotic features: Secondary | ICD-10-CM

## 2022-03-16 DIAGNOSIS — M545 Low back pain, unspecified: Secondary | ICD-10-CM | POA: Diagnosis not present

## 2022-03-16 LAB — CBC WITH DIFFERENTIAL/PLATELET
Basophils Absolute: 0.1 10*3/uL (ref 0.0–0.2)
Basos: 1 %
EOS (ABSOLUTE): 0.2 10*3/uL (ref 0.0–0.4)
Eos: 1 %
Hematocrit: 40 % (ref 34.0–46.6)
Hemoglobin: 13 g/dL (ref 11.1–15.9)
Immature Grans (Abs): 0.1 10*3/uL (ref 0.0–0.1)
Immature Granulocytes: 1 %
Lymphocytes Absolute: 2 10*3/uL (ref 0.7–3.1)
Lymphs: 18 %
MCH: 30.2 pg (ref 26.6–33.0)
MCHC: 32.5 g/dL (ref 31.5–35.7)
MCV: 93 fL (ref 79–97)
Monocytes Absolute: 0.8 10*3/uL (ref 0.1–0.9)
Monocytes: 7 %
Neutrophils Absolute: 8 10*3/uL — ABNORMAL HIGH (ref 1.4–7.0)
Neutrophils: 72 %
Platelets: 331 10*3/uL (ref 150–450)
RBC: 4.3 x10E6/uL (ref 3.77–5.28)
RDW: 12.8 % (ref 11.7–15.4)
WBC: 11.1 10*3/uL — ABNORMAL HIGH (ref 3.4–10.8)

## 2022-03-16 LAB — URINALYSIS
Bilirubin, UA: NEGATIVE
Glucose, UA: NEGATIVE
Ketones, UA: NEGATIVE
Leukocytes,UA: NEGATIVE
Nitrite, UA: NEGATIVE
Protein,UA: NEGATIVE
Specific Gravity, UA: 1.01 (ref 1.005–1.030)
Urobilinogen, Ur: 0.2 mg/dL (ref 0.2–1.0)
pH, UA: 6 (ref 5.0–7.5)

## 2022-03-16 LAB — CMP14+EGFR
ALT: 26 IU/L (ref 0–32)
AST: 26 IU/L (ref 0–40)
Albumin/Globulin Ratio: 1.5 (ref 1.2–2.2)
Albumin: 4 g/dL (ref 3.8–4.9)
Alkaline Phosphatase: 157 IU/L — ABNORMAL HIGH (ref 44–121)
BUN/Creatinine Ratio: 10 (ref 9–23)
BUN: 8 mg/dL (ref 6–24)
Bilirubin Total: 0.2 mg/dL (ref 0.0–1.2)
CO2: 23 mmol/L (ref 20–29)
Calcium: 9.6 mg/dL (ref 8.7–10.2)
Chloride: 106 mmol/L (ref 96–106)
Creatinine, Ser: 0.77 mg/dL (ref 0.57–1.00)
Globulin, Total: 2.6 g/dL (ref 1.5–4.5)
Glucose: 100 mg/dL — ABNORMAL HIGH (ref 70–99)
Potassium: 4.6 mmol/L (ref 3.5–5.2)
Sodium: 145 mmol/L — ABNORMAL HIGH (ref 134–144)
Total Protein: 6.6 g/dL (ref 6.0–8.5)
eGFR: 89 mL/min/{1.73_m2} (ref >59)

## 2022-03-16 MED ORDER — KETOROLAC TROMETHAMINE 60 MG/2ML IM SOLN
60.0000 mg | Freq: Once | INTRAMUSCULAR | Status: AC
  Administered 2022-03-16: 60 mg via INTRAMUSCULAR

## 2022-03-16 MED ORDER — QUETIAPINE FUMARATE 50 MG PO TABS: 50.0000 mg | ORAL_TABLET | Freq: Every day | ORAL | 1 refills | Status: DC

## 2022-03-16 MED ORDER — PREDNISONE 10 MG PO TABS: ORAL_TABLET | ORAL | 0 refills | Status: DC

## 2022-03-16 NOTE — Progress Notes (Signed)
Subjective:  Patient ID: Gina Hendricks, female    DOB: 1960-10-16  Age: 61 y.o. MRN: [id number]  CC: Back Pain (Right lower)   HPI Gina Hendricks presents for sudden onset 8/10 back pain on arising yesterday. Pointing to the right lower flank area. NKI. Denies dysuria, hematuria. No fever, chills.      03/16/2022   10:12 AM 01/12/2022    8:09 AM 12/03/2021   12:10 PM  Depression screen PHQ 2/9  Decreased Interest 3 3 2   Down, Depressed, Hopeless 3 3 3   PHQ - 2 Score 6 6 5   Altered sleeping 3 3   Tired, decreased energy 3 3   Change in appetite 3 3   Feeling bad or failure about yourself  3 3   Trouble concentrating 3 0   Moving slowly or fidgety/restless 3 3   Suicidal thoughts 1 0   PHQ-9 Score 25 21   Difficult doing work/chores Extremely dIfficult Extremely dIfficult     History Gina Hendricks has a past medical history of Anxiety, Anxiety disorder, unspecified (08/23/2020), Depression, and Heart murmur.   She has a past surgical history that includes Finger surgery (Right).   Her family history includes Alcohol abuse in her brother, brother, and maternal grandfather; Arthritis in her mother; Depression in her brother; Heart disease in her father, maternal grandmother, and paternal grandfather; Vision loss in her mother.She reports that she has been smoking cigarettes. She started smoking about 41 years ago. She has been smoking an average of 1 pack per day. She has never used smokeless tobacco. She reports that she does not drink alcohol and does not use drugs.    ROS Review of Systems  Constitutional: Negative.   HENT: Negative.    Eyes:  Negative for visual disturbance.  Respiratory:  Negative for shortness of breath.   Cardiovascular:  Negative for chest pain.  Gastrointestinal:  Negative for abdominal pain.  Musculoskeletal:  Negative for arthralgias.    Objective:  BP 124/82   Pulse 72   Temp 98.1 F (36.7 C)   Ht 4' 11"  (1.499 m)   Wt 118 lb (53.5 kg)   SpO2  97%   BMI 23.83 kg/m   BP Readings from Last 3 Encounters:  03/16/22 124/82  07/28/21 115/84  02/07/20 111/64    Wt Readings from Last 3 Encounters:  03/16/22 118 lb (53.5 kg)  07/28/21 103 lb (46.7 kg)  11/30/19 112 lb (50.8 kg)     Physical Exam Constitutional:      General: She is not in acute distress.    Appearance: She is well-developed.  Cardiovascular:     Rate and Rhythm: Normal rate and regular rhythm.  Pulmonary:     Breath sounds: Normal breath sounds.  Abdominal:     Tenderness: There is no abdominal tenderness.  Musculoskeletal:        General: Tenderness (left L5 laterally with spasm noted.) present. Normal range of motion.  Skin:    General: Skin is warm and dry.  Neurological:     Mental Status: She is alert and oriented to person, place, and time.       Assessment & Plan:   Gina Hendricks was seen today for back pain.  Diagnoses and all orders for this visit:  Right lumbar pain -     Urine Culture -     Urinalysis -     CBC with Differential/Platelet -     CMP14+EGFR  Severe episode of recurrent major depressive  disorder, without psychotic features ([place])  Other orders -     predniSONE (DELTASONE) 10 MG tablet; Take 5 daily for 2 days followed by 4,3,2 and 1 for 2 days each. -     QUEtiapine (SEROQUEL) 50 MG tablet; Take 1 tablet (50 mg total) by mouth at bedtime.       I have discontinued Richard D. Clay's ondansetron. I have also changed her QUEtiapine. Additionally, I am having her start on predniSONE. Lastly, I am having her maintain her busPIRone and DULoxetine.  Allergies as of 03/16/2022       Reactions   Zoloft [sertraline Hcl] Other (See Comments)   "out of body experience"        Medication List        Accurate as of March 16, 2022 10:36 AM. If you have any questions, ask your nurse or doctor.          STOP taking these medications    ondansetron 4 MG tablet Commonly known as: Zofran Stopped by: Claretta Fraise,  MD       TAKE these medications    busPIRone 10 MG tablet Commonly known as: BUSPAR Take 1 tablet (10 mg total) by mouth 3 (three) times daily.   DULoxetine 60 MG capsule Commonly known as: CYMBALTA TAKE 2 CAPSULES BY MOUTH DAILY   predniSONE 10 MG tablet Commonly known as: DELTASONE Take 5 daily for 2 days followed by 4,3,2 and 1 for 2 days each. Started by: Claretta Fraise, MD   QUEtiapine 50 MG tablet Commonly known as: SEROQUEL Take 1 tablet (50 mg total) by mouth at bedtime. What changed:  medication strength See the new instructions. Changed by: Claretta Fraise, MD         Follow-up: Return in about 2 weeks (around 03/30/2022) for Depression.  Claretta Fraise, M.D.

## 2022-03-16 NOTE — Addendum Note (Signed)
Addended by: Claretta Fraise on: 03/16/2022 10:38 AM   Modules accepted: Orders

## 2022-03-16 NOTE — Patient Instructions (Signed)

## 2022-03-17 LAB — URINE CULTURE

## 2022-03-17 NOTE — Progress Notes (Signed)
Hello Gina Hendricks,  Your lab result is normal and/or stable.Some minor variations that are not significant are commonly marked abnormal, but do not represent any medical problem for you.  Best regards, Claretta Fraise, M.D.

## 2022-03-30 ENCOUNTER — Ambulatory Visit: Payer: Medicare Other | Admitting: Family Medicine

## 2022-04-07 ENCOUNTER — Other Ambulatory Visit: Payer: Self-pay | Admitting: Family Medicine

## 2022-08-03 ENCOUNTER — Encounter: Payer: Self-pay | Admitting: Family Medicine

## 2022-08-04 ENCOUNTER — Telehealth: Payer: Self-pay | Admitting: Family Medicine

## 2022-08-05 ENCOUNTER — Encounter (HOSPITAL_COMMUNITY): Payer: Self-pay | Admitting: Psychiatry

## 2022-08-05 ENCOUNTER — Telehealth (HOSPITAL_COMMUNITY): Payer: Self-pay | Admitting: Professional

## 2022-08-05 ENCOUNTER — Ambulatory Visit (HOSPITAL_COMMUNITY)
Admission: EM | Admit: 2022-08-05 | Discharge: 2022-08-05 | Disposition: A | Discharge status: Home / Self Care | Payer: 59 | Attending: Psychiatry | Admitting: Psychiatry

## 2022-08-05 DIAGNOSIS — F17210 Nicotine dependence, cigarettes, uncomplicated: Secondary | ICD-10-CM | POA: Diagnosis not present

## 2022-08-05 DIAGNOSIS — F4323 Adjustment disorder with mixed anxiety and depressed mood: Secondary | ICD-10-CM | POA: Diagnosis present

## 2022-08-05 DIAGNOSIS — R45851 Suicidal ideations: Secondary | ICD-10-CM | POA: Diagnosis not present

## 2022-08-05 NOTE — Discharge Instructions (Addendum)
You have been referred to the partial hospitalization/intensive outpatient programs. A staff member should be contacting you shortly to schedule an intake appointment. For any questions, please contact [phone].  You may also go to [place] to set up medication management and counseling services. I have called [facility] today and per [facility] staff, walk ins are seen Monday through Friday 8AM to [place] are currently being seen at [address], [place], [place] [address] while construction is being completed at [address], [place], [place] [address]. Phone number is [phone].

## 2022-08-05 NOTE — Progress Notes (Signed)
   08/05/22 1153  [place] (Walk-ins at [facility] only)  How Did You Hear About [place]? Family/Friend  What Is the Reason for Your Visit/Call Today? Gina Hendricks is a 62 y/o female that presents to the [facility], accompanied by her friend "Butch Penny". Patient has a diagnosis of depression, severe anxiety, and chronic suicidal ideations ("for many years"). She has a complaint today of suicidal thoughts, no plan, no intent. States that although she thinks about suicide all the time her religion is a protective factor. Therefore, she would not do anything to end her life. No prior attempts/gestures. No access to means. No hx of self injurious behaviors. No HI and AVH's. No alcohol/drug use. However, mentions that she was prescribed Xanax for 30 yrs. She stopped taking Xanax abruptly in 2022 and that is when her anxiety intensified. Per her friend, "She has a fear of everything, including water, this has been on-going since 2022". Patient lives in a trailer, alone. She has no support outside of the friend that is with her today. The friend brings her food and checks on her several times a week. Her friend has concerns that patient is not able to take care of her basic needs. Patient received inpt psych tx in 2022. No therapist/psychiatrist.  How Long Has This Been Causing You Problems? > than 6 months  Have You Recently Had Any Thoughts About Hurting Yourself? Yes  How long ago did you have thoughts about hurting yourself? Chronic suicidal thoughts for several years.  Are You Planning to Commit Suicide/Harm Yourself At This time? Yes  Have you Recently Had Thoughts About Hurting Someone Guadalupe Dawn? No  Are You Planning To Harm Someone At This Time? No  Are you currently experiencing any auditory, visual or other hallucinations? No  Have You Used Any Alcohol or Drugs in the Past 24 Hours? No  Do you have any current medical co-morbidities that require immediate attention? No  Clinician description of patient physical  appearance/behavior: Disheveled. Fidgety. Restless. Depressed.  What Do You Feel Would Help You the Most Today? Treatment for Depression or other mood problem;Medication(s)  If access to [facility] was not available, would you have sought care in the Emergency Department? Yes  Determination of Need Routine (7 days)  Options For Referral Medication Management;Outpatient Therapy;Partial Hospitalization

## 2022-08-05 NOTE — ED Notes (Signed)
Discharge instructions provided and Pt stated understanding. Pt alert, orient and ambulatory prior to d/c from facility. Personal belongings returned. Patient escorted to the lobby to discharge from facility. Safety maintained.

## 2022-08-05 NOTE — ED Provider Notes (Signed)
Behavioral Health Urgent Care Medical Screening Exam  Patient Name: Gina Hendricks MRN: [id number] Date of Evaluation: 08/05/22 Chief Complaint: "My mind just ain't right. Can't function." Diagnosis:  Final diagnoses:  Adjustment disorder with mixed anxiety and depressed mood   History of Present illness: Gina Hendricks is a 62 y.o. female. Pt presents voluntarily to [facility] [place] behavioral health for walk-in assessment.  Pt is accompanied by her friend, Andrena Mews. Pt is assessed face-to-face by nurse practitioner.   Gina Hendricks, 62 y.o., female patient seen face to face by this provider; and chart reviewed on 08/05/22.  On evaluation, when asked reason for presenting today, Gina Hendricks reports, "My mind just ain't right. Can't function." Pt reports feeling "terrified" by everything. When asked what she feels terrified about, she reports, water, bugs, herself. She reports feeling this way for over 2 years. Pt reports depressed, anxious mood. She endorses poor appetite and sleep.  Pt endorses chronic passive suicidal ideations over the past 2 years. She denies plan or intent. She states she is religious and "not going to do anything because I don't want to go to hell". Pt denies homicidal or violent ideations. She denies auditory visual hallucinations or paranoia.   Pt denies history of non suicidal self injurious behavior. She endorses 3 or 4 suicide attempts, last occurring several years ago. She reports history of inpatient psychiatric hospitalizations, last occurring at [facility].  Pt reports use of nicotine/cigarettes. Reports smoking 2 ppd. She denies use of alcohol, marijuana, crack/cocaine, opioids, other substances.  Pt reports she is living alone.   Pt reports she is unemployed. She states she receives disability for mental health.  Pt denies access to a firearm or other weapon.  Collateral obtained from pt's friend, Butch Penny. Per Butch Penny, she brought pt to this facility today  due to depression, anxiety. She states pt is "scared of everything". Butch Penny states pt is a heavy cigarette smoker. She states pt's current presentation has been ongoing for the past 2 years. She believes symptoms started when pt stopped using xanax after 30 years of use and pt's mother passed away. She denies safety concerns that pt will attempt to hurt herself or someone else.   Spoke with Butch Penny and pt. They do not feel pt benefited from prior psychiatric hospitalizations.   Discussed referral to php/iop program. Pt agrees with referral, provides best contact information for her as [phone], [email or web address]. Referral made.   [facility] from 08/05/2022 in [facility] Admission (Discharged) from 08/22/2020 in [facility] ED from 08/21/2020 in [facility] CATEGORY Low Risk High Risk High Risk       Psychiatric Specialty Exam  Presentation  General Appearance:Appropriate for Environment; Casual; Fairly Groomed  Eye Contact:Fair  Speech:Clear and Coherent; Normal Rate  Speech Volume:Normal  Handedness:Right   Mood and Affect  Mood:Depressed; Anxious  Affect:Blunt   Thought Process  Thought Processes:Coherent; Goal Directed; Linear  Descriptions of Associations:Intact  Orientation:Full (Time, Place and Person)  Thought Content:Logical  Diagnosis of Schizophrenia or Schizoaffective disorder in past: No data recorded  Hallucinations:None  Ideas of Reference:None  Suicidal Thoughts:Yes, Passive Without Plan; Without Intent  Homicidal Thoughts:No   Sensorium  Memory:Immediate Good  Judgment:Fair  Insight:Fair   Executive Functions  Concentration:Fair  Attention Span:Fair  [place]  Language:Good   Psychomotor Activity  Psychomotor Activity:Restlessness   Assets  Assets:Communication Skills; Desire for  Improvement; Financial  Resources/Insurance; Housing; Resilience; Social Support   Sleep  Sleep:Poor  Number of hours: No data recorded  Physical Exam: Physical Exam Constitutional:      General: She is not in acute distress.    Appearance: She is not ill-appearing, toxic-appearing or diaphoretic.  Eyes:     General: No scleral icterus. Cardiovascular:     Rate and Rhythm: Normal rate.  Pulmonary:     Effort: Pulmonary effort is normal. No respiratory distress.  Skin:    General: Skin is warm and dry.  Neurological:     General: No focal deficit present.     Mental Status: She is alert and oriented to person, place, and time.  Psychiatric:        Attention and Perception: Attention and perception normal.        Mood and Affect: Mood is anxious and depressed. Affect is blunt.        Speech: Speech normal.        Behavior: Behavior normal. Behavior is cooperative.        Thought Content: Thought content includes suicidal ideation. Thought content does not include suicidal plan.        Cognition and Memory: Cognition and memory normal.    Review of Systems  Constitutional:  Negative for chills and fever.  Respiratory:  Negative for shortness of breath.   Cardiovascular:  Negative for chest pain and palpitations.  Gastrointestinal:  Negative for abdominal pain.  Neurological:  Negative for headaches.  Psychiatric/Behavioral:  Positive for depression and suicidal ideas. The patient is nervous/anxious.    Blood pressure 101/77, pulse 91, temperature 98.2 F (36.8 C), temperature source Oral, resp. rate 18, SpO2 98 %. There is no height or weight on file to calculate BMI.  Musculoskeletal: Strength & Muscle Tone: within normal limits Gait & Station: normal Patient leans: N/A  [place] [facility] Discharge Disposition for Follow up and Recommendations: Based on my evaluation the patient does not appear to have an emergency medical condition and can be discharged with resources and  follow up care in outpatient services for Medication Management, Partial Hospitalization Program, Individual Therapy, and Intensive Outpatient Program  Tharon Aquas, NP 08/05/2022, 1:24 PM

## 2022-08-25 ENCOUNTER — Encounter (HOSPITAL_COMMUNITY): Payer: Self-pay | Admitting: *Deleted

## 2022-08-25 ENCOUNTER — Emergency Department (HOSPITAL_COMMUNITY)
Admission: EM | Admit: 2022-08-25 | Discharge: 2022-08-26 | Disposition: A | Discharge status: Other Acute Inpatient Hospital | Payer: 59 | Source: Home / Self Care | Attending: Emergency Medicine | Admitting: Emergency Medicine

## 2022-08-25 ENCOUNTER — Other Ambulatory Visit: Payer: Self-pay

## 2022-08-25 DIAGNOSIS — R45851 Suicidal ideations: Secondary | ICD-10-CM

## 2022-08-25 DIAGNOSIS — R9431 Abnormal electrocardiogram [ECG] [EKG]: Secondary | ICD-10-CM | POA: Diagnosis not present

## 2022-08-25 DIAGNOSIS — Z1152 Encounter for screening for COVID-19: Secondary | ICD-10-CM | POA: Insufficient documentation

## 2022-08-25 DIAGNOSIS — F333 Major depressive disorder, recurrent, severe with psychotic symptoms: Secondary | ICD-10-CM | POA: Diagnosis not present

## 2022-08-25 DIAGNOSIS — F329 Major depressive disorder, single episode, unspecified: Secondary | ICD-10-CM | POA: Insufficient documentation

## 2022-08-25 LAB — RAPID URINE DRUG SCREEN, HOSP PERFORMED
Amphetamines: NOT DETECTED
Barbiturates: NOT DETECTED
Benzodiazepines: NOT DETECTED
Cocaine: NOT DETECTED
Opiates: NOT DETECTED
Tetrahydrocannabinol: NOT DETECTED

## 2022-08-25 LAB — CBC WITH DIFFERENTIAL/PLATELET
Abs Immature Granulocytes: 0.05 10*3/uL (ref 0.00–0.07)
Basophils Absolute: 0.1 10*3/uL (ref 0.0–0.1)
Basophils Relative: 1 %
Eosinophils Absolute: 0.1 10*3/uL (ref 0.0–0.5)
Eosinophils Relative: 1 %
HCT: 43.4 % (ref 36.0–46.0)
Hemoglobin: 14.2 g/dL (ref 12.0–15.0)
Immature Granulocytes: 1 %
Lymphocytes Relative: 21 %
Lymphs Abs: 2.2 10*3/uL (ref 0.7–4.0)
MCH: 30.8 pg (ref 26.0–34.0)
MCHC: 32.7 g/dL (ref 30.0–36.0)
MCV: 94.1 fL (ref 80.0–100.0)
Monocytes Absolute: 0.4 10*3/uL (ref 0.1–1.0)
Monocytes Relative: 4 %
Neutro Abs: 7.4 10*3/uL (ref 1.7–7.7)
Neutrophils Relative %: 72 %
Platelets: 261 10*3/uL (ref 150–400)
RBC: 4.61 MIL/uL (ref 3.87–5.11)
RDW: 13.6 % (ref 11.5–15.5)
WBC: 10.3 10*3/uL (ref 4.0–10.5)
nRBC: 0 % (ref 0.0–0.2)

## 2022-08-25 LAB — URINALYSIS, ROUTINE W REFLEX MICROSCOPIC
Bilirubin Urine: NEGATIVE
Glucose, UA: NEGATIVE mg/dL
Hgb urine dipstick: NEGATIVE
Ketones, ur: NEGATIVE mg/dL
Leukocytes,Ua: NEGATIVE
Nitrite: NEGATIVE
Protein, ur: NEGATIVE mg/dL
Specific Gravity, Urine: 1.013 (ref 1.005–1.030)
pH: 6 (ref 5.0–8.0)

## 2022-08-25 LAB — SALICYLATE LEVEL: Salicylate Lvl: <7 mg/dL — ABNORMAL LOW (ref 7.0–30.0)

## 2022-08-25 LAB — ETHANOL: Alcohol, Ethyl (B): <10 mg/dL (ref <10)

## 2022-08-25 LAB — ACETAMINOPHEN LEVEL: Acetaminophen (Tylenol), Serum: <10 ug/mL — ABNORMAL LOW (ref 10–30)

## 2022-08-25 LAB — COMPREHENSIVE METABOLIC PANEL
ALT: 12 U/L (ref 0–44)
AST: 15 U/L (ref 15–41)
Albumin: 3.4 g/dL — ABNORMAL LOW (ref 3.5–5.0)
Alkaline Phosphatase: 113 U/L (ref 38–126)
Anion gap: 9 (ref 5–15)
BUN: 8 mg/dL (ref 6–20)
CO2: 23 mmol/L (ref 22–32)
Calcium: 9 mg/dL (ref 8.9–10.3)
Chloride: 105 mmol/L (ref 98–111)
Creatinine, Ser: 0.58 mg/dL (ref 0.44–1.00)
GFR, Estimated: >60 mL/min (ref >60)
Glucose, Bld: 148 mg/dL — ABNORMAL HIGH (ref 70–99)
Potassium: 3.3 mmol/L — ABNORMAL LOW (ref 3.5–5.1)
Sodium: 137 mmol/L (ref 135–145)
Total Bilirubin: 0.5 mg/dL (ref 0.3–1.2)
Total Protein: 6.6 g/dL (ref 6.5–8.1)

## 2022-08-25 MED ORDER — CYCLOBENZAPRINE HCL 10 MG PO TABS
5.0000 mg | ORAL_TABLET | Freq: Once | ORAL | Status: AC
  Administered 2022-08-25: 5 mg via ORAL
  Filled 2022-08-25: qty 1

## 2022-08-25 MED ORDER — HYDROXYZINE HCL 25 MG PO TABS
25.0000 mg | ORAL_TABLET | Freq: Three times a day (TID) | ORAL | Status: DC | PRN
  Administered 2022-08-25 (×2): 25 mg via ORAL
  Filled 2022-08-25 (×2): qty 1

## 2022-08-25 NOTE — ED Triage Notes (Signed)
Pt wanded in triage by security.  

## 2022-08-25 NOTE — ED Provider Notes (Signed)
Gina Hendricks Provider Note   CSN: [id number] Arrival date & time: 08/25/22  1156     History  Chief Complaint  Patient presents with   V70.1    Gina Hendricks is a 62 y.o. female, history of depression, who presents to the ED secondary to suicidal ideation.  He Gina Hendricks says that she just cannot take anymore, and has had suicidal ideations for months, but has not done anything.  She states that she has a past attempt, when she took a bunch of pills, denies any use pills today.  Has not anything for her depression.  She states she has had multiple inpatient hospitalizations for psychiatry concerns, but nothing has helped.  Is here for help.  Denies any homicidal ideation or drug use.  No AVH      Home Medications Prior to Admission medications   Medication Sig Start Date End Date Taking? Authorizing Provider  ibuprofen (ADVIL) 200 MG tablet Take 200-400 mg by mouth every 6 (six) hours as needed for moderate pain.   Yes [provider]  busPIRone (BUSPAR) 10 MG tablet Take 1 tablet (10 mg total) by mouth 3 (three) times daily. Patient not taking: Reported on 08/25/2022 01/12/22   Claretta Fraise, MD  DULoxetine (CYMBALTA) 60 MG capsule TAKE 2 CAPSULES BY MOUTH DAILY Patient not taking: Reported on 08/25/2022 02/04/22   Claretta Fraise, MD  predniSONE (DELTASONE) 10 MG tablet Take 5 daily for 2 days followed by 4,3,2 and 1 for 2 days each. Patient not taking: Reported on 08/25/2022 03/16/22   Claretta Fraise, MD  QUEtiapine (SEROQUEL) 50 MG tablet TAKE 1 TABLET BY MOUTH EVERYDAY AT BEDTIME Patient not taking: Reported on 08/25/2022 04/07/22   Claretta Fraise, MD      Allergies    Zoloft [sertraline hcl]    Review of Systems   Review of Systems  Psychiatric/Behavioral:  Positive for suicidal ideas. Negative for hallucinations.     Physical Exam Updated Vital Signs BP 104/77 (BP Location: Right Arm)   Pulse 81   Temp 98.6 F (37 C)  (Oral)   Resp 16   Ht 4\' 11"  (1.499 m)   Wt 53 kg   SpO2 100%   BMI 23.60 kg/m  Physical Exam Vitals and nursing note reviewed.  Constitutional:      General: She is not in acute distress.    Appearance: She is well-developed.  HENT:     Head: Normocephalic and atraumatic.  Eyes:     Conjunctiva/sclera: Conjunctivae normal.  Cardiovascular:     Rate and Rhythm: Normal rate and regular rhythm.     Heart sounds: No murmur heard. Pulmonary:     Effort: Pulmonary effort is normal. No respiratory distress.     Breath sounds: Normal breath sounds.  Abdominal:     Palpations: Abdomen is soft.     Tenderness: There is no abdominal tenderness.  Musculoskeletal:        General: No swelling.     Cervical back: Neck supple.  Skin:    General: Skin is warm and dry.     Capillary Refill: Capillary refill takes less than 2 seconds.  Neurological:     Mental Status: She is alert.  Psychiatric:        Mood and Affect: Mood normal.     ED Results / Procedures / Treatments   Labs (all labs ordered are listed, but only abnormal results are displayed) Labs Reviewed  COMPREHENSIVE METABOLIC PANEL -  Abnormal; Notable for the following components:      Result Value   Potassium 3.3 (*)    Glucose, Bld 148 (*)    Albumin 3.4 (*)    All other components within normal limits  ACETAMINOPHEN LEVEL - Abnormal; Notable for the following components:   Acetaminophen (Tylenol), Serum <10 (*)    All other components within normal limits  SALICYLATE LEVEL - Abnormal; Notable for the following components:   Salicylate Lvl [id number] (*)    All other components within normal limits  ETHANOL  RAPID URINE DRUG SCREEN, HOSP PERFORMED  CBC WITH DIFFERENTIAL/PLATELET  URINALYSIS, ROUTINE W REFLEX MICROSCOPIC  POC URINE PREG, ED    EKG None  Radiology No results found.  Procedures Procedures    Medications Ordered in ED Medications  hydrOXYzine (ATARAX) tablet 25 mg (25 mg Oral Given 08/25/22  1846)  cyclobenzaprine (FLEXERIL) tablet 5 mg (5 mg Oral Given 08/25/22 1846)    ED Course/ Medical Decision Making/ A&P                             Medical Decision Making Patient is a 62 year old female, here with SI.  She states she does not have a plan, but has had attempts in the past.  Denies overdosing on pills today, has but has done that in the past.  Would like help.  We will medically clear, and have psychiatry further evaluate.  Amount and/or Complexity of Data Reviewed Labs: ordered.    Details: Unremarkable Discussion of management or test interpretation with external provider(s): Medically cleared, psychiatry to further evaluate.  She is not currently on any medications for her psychiatric complaints.  Denies any plan.  Patient would benefit from psychiatric eval.  Risk Prescription drug management.    Final Clinical Impression(s) / ED Diagnoses Final diagnoses:  Suicidal ideation    Rx / DC Orders ED Discharge Orders     None         Ledora Delker, Si Gaul, PA 08/25/22 2028    Godfrey Pick, MD 08/26/22 [phone]

## 2022-08-25 NOTE — ED Notes (Signed)
Pt was given a bag lunch

## 2022-08-25 NOTE — ED Triage Notes (Signed)
Pt admits to SI, denies any plan.  Denies any ETOH or drug use.

## 2022-08-26 ENCOUNTER — Inpatient Hospital Stay (HOSPITAL_COMMUNITY)
Admission: AD | Admit: 2022-08-26 | Discharge: 2022-09-04 | DRG: 885 | Disposition: A | Discharge status: Home / Self Care | Payer: 59 | Source: Other Acute Inpatient Hospital | Attending: Psychiatry | Admitting: Psychiatry

## 2022-08-26 ENCOUNTER — Other Ambulatory Visit: Payer: Self-pay

## 2022-08-26 ENCOUNTER — Encounter (HOSPITAL_COMMUNITY): Payer: Self-pay | Admitting: Psychiatry

## 2022-08-26 DIAGNOSIS — F1211 Cannabis abuse, in remission: Secondary | ICD-10-CM | POA: Diagnosis present

## 2022-08-26 DIAGNOSIS — Z818 Family history of other mental and behavioral disorders: Secondary | ICD-10-CM | POA: Diagnosis not present

## 2022-08-26 DIAGNOSIS — Z79899 Other long term (current) drug therapy: Secondary | ICD-10-CM

## 2022-08-26 DIAGNOSIS — R45851 Suicidal ideations: Secondary | ICD-10-CM | POA: Diagnosis present

## 2022-08-26 DIAGNOSIS — F332 Major depressive disorder, recurrent severe without psychotic features: Secondary | ICD-10-CM | POA: Diagnosis not present

## 2022-08-26 DIAGNOSIS — F341 Dysthymic disorder: Secondary | ICD-10-CM | POA: Diagnosis present

## 2022-08-26 DIAGNOSIS — Z20822 Contact with and (suspected) exposure to covid-19: Secondary | ICD-10-CM | POA: Diagnosis present

## 2022-08-26 DIAGNOSIS — Z811 Family history of alcohol abuse and dependence: Secondary | ICD-10-CM | POA: Diagnosis not present

## 2022-08-26 DIAGNOSIS — F17210 Nicotine dependence, cigarettes, uncomplicated: Secondary | ICD-10-CM | POA: Diagnosis present

## 2022-08-26 DIAGNOSIS — F1011 Alcohol abuse, in remission: Secondary | ICD-10-CM | POA: Diagnosis present

## 2022-08-26 DIAGNOSIS — F1311 Sedative, hypnotic or anxiolytic abuse, in remission: Secondary | ICD-10-CM | POA: Diagnosis present

## 2022-08-26 DIAGNOSIS — F329 Major depressive disorder, single episode, unspecified: Principal | ICD-10-CM | POA: Diagnosis present

## 2022-08-26 DIAGNOSIS — G4700 Insomnia, unspecified: Secondary | ICD-10-CM | POA: Diagnosis present

## 2022-08-26 DIAGNOSIS — F419 Anxiety disorder, unspecified: Secondary | ICD-10-CM | POA: Diagnosis present

## 2022-08-26 DIAGNOSIS — F333 Major depressive disorder, recurrent, severe with psychotic symptoms: Principal | ICD-10-CM | POA: Diagnosis present

## 2022-08-26 LAB — RESP PANEL BY RT-PCR (RSV, FLU A&B, COVID)  RVPGX2
Influenza A by PCR: NEGATIVE
Influenza B by PCR: NEGATIVE
Resp Syncytial Virus by PCR: NEGATIVE
SARS Coronavirus 2 by RT PCR: NEGATIVE

## 2022-08-26 MED ORDER — MAGNESIUM HYDROXIDE 400 MG/5ML PO SUSP: 30.0000 mL | Freq: Every day | ORAL | Status: DC | PRN

## 2022-08-26 MED ORDER — ALUM & MAG HYDROXIDE-SIMETH 200-200-20 MG/5ML PO SUSP: 30.0000 mL | ORAL | Status: DC | PRN

## 2022-08-26 MED ORDER — LORAZEPAM 2 MG/ML IJ SOLN: 2.0000 mg | Freq: Three times a day (TID) | INTRAMUSCULAR | Status: DC | PRN

## 2022-08-26 MED ORDER — HYDROXYZINE HCL 10 MG PO TABS
10.0000 mg | ORAL_TABLET | Freq: Three times a day (TID) | ORAL | Status: DC | PRN
  Administered 2022-08-26 – 2022-09-04 (×14): 10 mg via ORAL
  Filled 2022-08-26 (×14): qty 1

## 2022-08-26 MED ORDER — ACETAMINOPHEN 325 MG PO TABS: 650.0000 mg | ORAL_TABLET | Freq: Four times a day (QID) | ORAL | Status: DC | PRN

## 2022-08-26 MED ORDER — LORAZEPAM 1 MG PO TABS: 2.0000 mg | ORAL_TABLET | Freq: Three times a day (TID) | ORAL | Status: DC | PRN

## 2022-08-26 NOTE — [facility] Group Notes (Signed)
Pt attended N/A

## 2022-08-26 NOTE — Tx Team (Signed)
Initial Treatment Plan 08/26/2022 3:39 PM Gina Hendricks [id number]    PATIENT STRESSORS: Financial difficulties   Medication change or noncompliance     PATIENT STRENGTHS: Average or above average intelligence  Capable of independent living  Communication skills    PATIENT IDENTIFIED PROBLEMS: Suicidal ideation  Depression  Anxiety  Nicotine use disorder               DISCHARGE CRITERIA:  Ability to meet basic life and health needs Improved stabilization in mood, thinking, and/or behavior Motivation to continue treatment in a less acute level of care  PRELIMINARY DISCHARGE PLAN: Outpatient therapy  PATIENT/FAMILY INVOLVEMENT: This treatment plan has been presented to and reviewed with the patient, [name].  The patient has been given the opportunity to ask questions and make suggestions.  Luna Glasgow, RN 08/26/2022, 3:39 PM

## 2022-08-26 NOTE — BH Assessment (Addendum)
Comprehensive Clinical Assessment (CCA) Note  08/26/2022 Gina Hendricks [id number]  Disposition: Clinical report given to Randon Goldsmith, NP who recommends inpatient admission. RN Ginger Pruitt notified of the recommendation.   The patient demonstrates the following risk factors for suicide: Chronic risk factors for suicide include: psychiatric disorder of MDD and anxiety . Acute risk factors for suicide include: social withdrawal/isolation. Protective factors for this patient include:  none . Considering these factors, the overall suicide risk at this point appears to be moderate. Patient is not appropriate for outpatient follow up.  Gina Hendricks is a 62 y.o. single female who presents voluntarily to [facility] ED due to [facility] with no plan. Pt reports she has been experiencing SI for at least 3 months. Pt states she has a diagnosis of MDD and anxiety. Pt acknowledges depressive symptoms to include isolations, decreased energy, and hopelessness. Pt repots she feels depressed and anxious all the time. Pt denies recent manic symptoms. Pt denies recent HI, auditory or visual hallucinations. Pt denies substance use. Pt denies access to guns or weapons.  When asked about stressors, Pt states "I just feel no reason to live. I have nobody". Pt lives alone and receives disability. Pt identifies no supports.   Pt is not receiving outpatient mental health services. Pt reports a previous hospitalization 08/22/20-3/29-22. Pt states she was prescribed medication in the past, however she stopped taking it due to not finding it helpful.   Pt is dressed in scrubs, alert and oriented x4 with normal speech. Pt has a depressed mood and flat affect. Pt has good eye contact, and her thought process is relevant. There is no indication Pt is responding to internal stimuli. Pt was cooperative throughout the assessment.   Chief Complaint:  Chief Complaint  Patient presents with   V70.1   Suicidal   Visit Diagnosis:  Major depressive disorder    CCA Screening, Triage and Referral (STR)  Patient Reported Information How did you hear about [place]? Self  What Is the Reason for Your Visit/Call Today? Pt presents voluntarily due to SI with no plan. Pt reports a diagnosis of MDD and generalized anxiety disorder. Pt denies receiving any outpatient mental health services currently. Pt reports a previous hospitalization 08/22/20-3/29-22. Pt denies HI, auditory or visual hallucinations. Pt denies substance use.  How Long Has This Been Causing You Problems? 1-6 months  What Do You Feel Would Help You the Most Today? Treatment for Depression or other mood problem   Have You Recently Had Any Thoughts About Hurting Yourself? Yes  Are You Planning to Commit Suicide/Harm Yourself At This time? No   Flowsheet Row ED from 08/25/2022 in [facility] Emergency Department at [facility] ED from 08/05/2022 in [facility] Admission (Discharged) from 08/22/2020 in [place] [facility]  C-SSRS RISK CATEGORY Moderate Risk Low Risk High Risk       Have you Recently Had Thoughts About [place]? No  Are You Planning to Harm Someone at This Time? No  Explanation: N/A   Have You Used Any Alcohol or Drugs in the Past 24 Hours? No  What Did You Use and How Much? N/A   Do You Currently Have a Therapist/Psychiatrist? No  Name of Therapist/Psychiatrist: Name of Therapist/Psychiatrist: N/A   Have You Been Recently Discharged From Any Office Practice or Programs? No  Explanation of Discharge From Practice/Program: N/A     CCA Screening Triage Referral Assessment Type of Contact: Tele-Assessment  Telemedicine Service Delivery: Telemedicine service delivery: This service was provided via telemedicine using a 2-way, interactive audio and video technology  Is this Initial or Reassessment? Is this Initial or Reassessment?: Initial Assessment  Date  Telepsych consult ordered in [facility]:  Date Telepsych consult ordered in [facility]: 08/26/22  Time Telepsych consult ordered in [facility]:  Time Telepsych consult ordered in [facility]: [employer]  Location of Assessment: [facility] ED  Provider Location: [facility] Assessment Services   Collateral Involvement: None   Does Patient Have a [place]? No  Legal Guardian Contact Information: N/A  Copy of Legal Guardianship Form: -- (N/A)  Legal Guardian Notified of Arrival: -- (N/A)  Legal Guardian Notified of Pending Discharge: -- (N/A)  If Minor and Not Living with Parent(s), Who has Custody? N/A  Is CPS involved or ever been involved? Never  Is APS involved or ever been involved? Never   Patient Determined To Be At Risk for Harm To Self or Others Based on Review of Patient Reported Information or Presenting Complaint? Yes, for Self-Harm  Method: No Plan (denies HI)  Availability of Means: No access or NA (denies HI)  Intent: Vague intent or NA (Denies HI)  Notification Required: No need or identified person (denies HI)  Additional Information for Danger to Others Potential: -- (N/A)  Additional Comments for Danger to Others Potential: N/A  Are There Guns or Other Weapons in Your Home? No  Types of Guns/Weapons: N/A  Are These Weapons Safely Secured?                            -- (N/A)  Who Could Verify You Are Able To Have These Secured: N/A  Do You Have any Outstanding Charges, Pending Court Dates, Parole/Probation? Pt denies  Contacted To Inform of Risk of Harm To Self or Others: -- (N/A)    Does Patient Present under Involuntary Commitment? No    [place] of Residence: [place]   Patient Currently Receiving the Following Services: Not Receiving Services   Determination of Need: Emergent (2 hours)   Options For Referral: Inpatient Hospitalization     CCA Biopsychosocial Patient Reported Schizophrenia/Schizoaffective Diagnosis in Past: No   Strengths: Pt  seeking treatment.   Mental Health Symptoms Depression:  Hopelessness; Change in energy/activity   Duration of Depressive symptoms: Duration of Depressive Symptoms: Greater than two weeks   Mania:  None   Anxiety:   Tension; Difficulty concentrating   Psychosis:  None   Duration of Psychotic symptoms:    Trauma:  None   Obsessions:  None   Compulsions:  None   Inattention:  None   Hyperactivity/Impulsivity:  None   Oppositional/Defiant Behaviors:  None   Emotional Irregularity:  None   Other Mood/Personality Symptoms:  N/A    Mental Status Exam Appearance and self-care  Stature:  Small   Weight:  Thin   Clothing:  -- (scrubs)   Grooming:  Normal   Cosmetic use:  None   Posture/gait:  Normal   Motor activity:  Not Remarkable   Sensorium  Attention:  Normal   Concentration:  Normal   Orientation:  X5   Recall/memory:  Normal   Affect and Mood  Affect:  Flat   Mood:  Depressed   Relating  Eye contact:  Normal   Facial expression:  Depressed   Attitude toward examiner:  Cooperative   Thought and Language  Speech flow: Normal   Thought content:  Appropriate  to Mood and Circumstances   Preoccupation:  None   Hallucinations:  None   Organization:  Linear   Transport planner of Knowledge:  Average   Intelligence:  Average   Abstraction:  Normal   Judgement:  Normal   Reality Testing:  Adequate   Insight:  Good   Decision Making:  Normal   Social Functioning  Social Maturity:  Isolates   Social Judgement:  Normal   Stress  Stressors:  Grief/losses   Coping Ability:  Normal   Skill Deficits:  No data recorded  Supports:  Support needed     Religion: Religion/Spirituality Are You A Religious Person?: Yes What is Your Religious Affiliation?: Baptist How Might This Affect Treatment?: N/A  Leisure/Recreation: Leisure / Recreation Do You Have Hobbies?: No  Exercise/Diet: Exercise/Diet Do You Exercise?:  No Have You Gained or Lost A Significant Amount of Weight in the Past Six Months?: No Do You Follow a Special Diet?: No Do You Have Any Trouble Sleeping?: Yes Explanation of Sleeping Difficulties: Pt reports poor sleep.   CCA Employment/Education Employment/Work Situation: Employment / Work Technical sales engineer: On disability Why is Patient on Disability: Due to mental health How Long has Patient Been on Disability: 1 year Patient's Job has Been Impacted by Current Illness: No Has Patient ever Been in the [employer]?: No  Education: Education Is Patient Currently Attending School?: No Last Grade Completed: 12 Did You Attend College?: No Did You Have An Individualized Education Program (IIEP): No Did You Have Any Difficulty At School?: No Patient's Education Has Been Impacted by Current Illness: No   CCA Family/Childhood History Family and Relationship History: Family history Marital status: Single Does patient have children?: No  Childhood History:  Childhood History By whom was/is the patient raised?: Both parents Did patient suffer any verbal/emotional/physical/sexual abuse as a child?: No Did patient suffer from severe childhood neglect?: No Has patient ever been sexually abused/assaulted/raped as an adolescent or adult?: No Was the patient ever a victim of a crime or a disaster?: No Witnessed domestic violence?: No Has patient been affected by domestic violence as an adult?: No       CCA Substance Use Alcohol/Drug Use: Alcohol / Drug Use Pain Medications: See MAR Prescriptions: See MAR Over the Counter: See MAR History of alcohol / drug use?: No history of alcohol / drug abuse Longest period of sobriety (when/how long): N/A Negative Consequences of Use:  (N/A) Withdrawal Symptoms:  (N/A)                         ASAM's:  Six Dimensions of Multidimensional Assessment  Dimension 1:  Acute Intoxication and/or Withdrawal Potential:       Dimension 2:  Biomedical Conditions and Complications:      Dimension 3:  Emotional, Behavioral, or Cognitive Conditions and Complications:     Dimension 4:  Readiness to Change:     Dimension 5:  Relapse, Continued use, or Continued Problem Potential:     Dimension 6:  Recovery/Living Environment:     ASAM Severity Score:    ASAM Recommended Level of Treatment:     Substance use Disorder (SUD)    Recommendations for Services/Supports/Treatments:    Discharge Disposition:    DSM5 Diagnoses: Patient Active Problem List   Diagnosis Date Noted   Anxiety 07/28/2021   Nausea 07/28/2021   GAD (generalized anxiety disorder) 08/23/2020   MDD (major depressive disorder), recurrent, severe, 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

## 2022-08-26 NOTE — Progress Notes (Addendum)
Update:Per [facility] AC bed will be ready 1245-1300  Pt was accepted to [facility] 08/26/22; Bed Assignment 300-1 PENDING COVID19  DX:MDD  Pt meets inpatient criteria per Dr. Dwyane Dee  Attending Physician will be Dr. Janine Limbo  Report can be called to: Adult unit: [phone]  Pt can arrive after PENDING Items/ Once COVID has resulted bed will be ready -[place] Team notified: [facility] Reader, RN, Ricky Ala, NP, Earleen Newport, NP, Adalberto Ill, RN, Meta Hatchet, RN  Nadara Mode, [place] 08/26/2022 @ 11:38 AM

## 2022-08-26 NOTE — Progress Notes (Signed)
Pt is a 63 year old who was voluntarily admitted after having been at [facility] ED for [place].  Patient says that she is lonely, sad, has lost interest in doing things she used to enjoy and feels isolated.  Pt said she was admitted at [facility] 2 years ago.  Pt's UDS was negative.  Pt says she has 1 close friend and a sibling who are supportive, but pt still feels very lonely and sad.  Pt is on disability and lives alone.    Pt signed admission paperwork.  Pt verbalized plan of care.  Pt appears to be adjusting well on the unit at this time.

## 2022-08-27 DIAGNOSIS — F332 Major depressive disorder, recurrent severe without psychotic features: Secondary | ICD-10-CM

## 2022-08-27 MED ORDER — TRAZODONE HCL 50 MG PO TABS
50.0000 mg | ORAL_TABLET | Freq: Every evening | ORAL | Status: DC | PRN
  Administered 2022-08-27 – 2022-08-29 (×3): 50 mg via ORAL
  Filled 2022-08-27 (×3): qty 1

## 2022-08-27 MED ORDER — NICOTINE 21 MG/24HR TD PT24
21.0000 mg | MEDICATED_PATCH | Freq: Every day | TRANSDERMAL | Status: DC
  Administered 2022-08-27 – 2022-09-04 (×9): 21 mg via TRANSDERMAL
  Filled 2022-08-27 (×11): qty 1

## 2022-08-27 MED ORDER — MELATONIN 3 MG PO TABS
3.0000 mg | ORAL_TABLET | Freq: Every day | ORAL | Status: DC
  Filled 2022-08-27 (×2): qty 1

## 2022-08-27 MED ORDER — DULOXETINE HCL 30 MG PO CPEP
30.0000 mg | ORAL_CAPSULE | Freq: Every day | ORAL | Status: DC
  Administered 2022-08-27 – 2022-08-30 (×4): 30 mg via ORAL
  Filled 2022-08-27 (×6): qty 1

## 2022-08-27 MED ORDER — MELATONIN 3 MG PO TABS
3.0000 mg | ORAL_TABLET | Freq: Every evening | ORAL | Status: DC | PRN
  Filled 2022-08-27: qty 1

## 2022-08-27 NOTE — H&P (Signed)
Psychiatric Admission Assessment Adult  Patient Identification: Gina Hendricks MRN:  [id number] Date of Evaluation:  08/27/2022 Chief Complaint:  MDD (major depressive disorder) [F32.9] Principal Diagnosis: MDD (major depressive disorder) Diagnosis:  Principal Problem:   MDD (major depressive disorder)    CC: "I'm in the hole"  Gina Hendricks is a 62 yo female with PPHX of MDD, alcohol use disorder in sustained remission, and benzodiazepine use disorder presenting voluntarily to [facility] for worsening depression and suicidal ideation. Admitted on 08/26/2022.  PRN medication prior to evaluation:  Atarax 1X  HPI:  Patient evaluated on the unit, psychomotor retardation appreciated during evaluation.  Patient is unable to maintain eye contact, states "I am in the whole".  Patient describes 2-year history of persistent and severe depression, reports she has become [place].  She lives [place].  Reports that she has not been the same since her mother passed away 2 years ago, at that time she was hospitalized at [facility] for worsening depression and suicidal ideation with plan to overdose on her deceased mother's medications.  During this hospitalization, she reports passive suicidal ideation, denies any plan or intent.  Reports that as a [place], she has strong religious beliefs against suicide, believes she would not go to heaven if she were to commit suicide.  For the past 2 years she reports feeling like she is in deep hole of depression, endorsing symptoms of diminished appetite, poor sleep hygiene, guilt, hopelessness, worthlessness, and psychomotor retardation.  Patient denies any inciting event that led to her hospitalization, simply states "my friends got tired of hearing me complaining about my depression".  Patient is perseverative and ruminative about her depressed mood, states "what he can to do to help me?  I do not think there is anything you could  do".  Patient also reports a 30-year history of Xanax use, believes this has contributed to her persistent depression.  Reports the last use of Xanax was in 2022, confirmed in chart review.  On assessment, patient denies any history of expansive mood or energy.  Does not meet criteria for any manic or hypomanic symptoms in the past.  She does report constantly worrying, feels persistently anxious and is unable to control her worries.  Only reports restlessness associated with her anxiety.  Patient denies ever experiencing auditory or visual hallucinations.  Denies any history of paranoid ideations.  Denies any history of delusional thought processes such as thought insertion, thought withdrawal, and ideas of reference.    Past Psychiatric Hx: Current Psychiatrist: Denies Current Therapist: Denies Previous Psych Diagnoses:  MDD, recurrent, severe, with psychosis Anxiety disorder, unspecified Benzodiazepine use disorder Current psychiatric medications: Denies Psychiatric medication history: Effexor-reports this is the most recent medication change she took.  Reports no improvement in symptoms Paxil Zoloft Prozac Wellbutrin Prior inpatient treatment: [facility] hospitalization 08/18/2020-08/27/2020 "The patient reported having suicidal ideation with a plan to overdose on her recently deceased mother's medications.  The patient was not taking any medications on admission having weaned herself off of Xanax with the last dose reportedly on July 12, 2020. " Medications at discharge include: Escitalopram 10 mg, hydroxyzine 25 mg, quetiapine 25 mg Current/prior outpatient treatment: History of suicide: as described in the HPI History of homicide or aggression: Denies   Substance Abuse Hx: Alcohol: last use 3 years ago Tobacco: 2 pack/day for 40 years Cannabis: Last use 1 year ago Rx drug abuse: Detailed in HPI  Past Medical History: PCP:  Denies [id number] Meds: Denies ALL:  Denies Hosp:  Denies Surgeries: Denies Trauma:Denies Seizures:Denies  Family History: Reports alcohol use on mother side.  Denies any history of suicide in her family.  Social History: Living: Lives alone in [place] Work: Unemployed, receiving SSA Marital Status: Single Children: Denies Abuse: None except as detailed in HPI Legal: Denies Military: Denies     Total Time spent with patient: 45 minutes  Is the patient at risk to self? Yes.    Has the patient been a risk to self in the past 6 months? Yes.    Has the patient been a risk to self within the distant past? Yes.    Is the patient a risk to others? No.  Has the patient been a risk to others in the past 6 months? No.  Has the patient been a risk to others within the distant past? No.   [place] Scale:  [place] Admission (Current) from 08/26/2022 in [facility] ED from 08/25/2022 in [facility] Emergency Department at [facility] ED from 08/05/2022 in [facility] Risk Moderate Risk Low Risk        Tobacco Screening:  Social History   Tobacco Use  Smoking Status Every Day   Packs/day: 2   Types: Cigarettes   Start date: 08/24/1980  Smokeless Tobacco Never    BH Tobacco Counseling     Are you interested in Tobacco Cessation Medications?  No, patient refused Counseled patient on smoking cessation:  Refused/Declined practical counseling Reason Tobacco Screening Not Completed: Patient Refused Screening       Social History:  Social History   Substance and Sexual Activity  Alcohol Use No     Social History   Substance and Sexual Activity  Drug Use No    Additional Social History:                           Allergies:   Allergies  Allergen Reactions   Zoloft [Sertraline Hcl] Other (See Comments)    "out of body experience"   Lab Results:  Results for orders placed or performed during the hospital  encounter of 08/25/22 (from the past 48 hour(s))  Comprehensive metabolic panel     Status: Abnormal   Collection Time: 08/25/22  2:18 PM  Result Value Ref Range   Sodium 137 135 - 145 mmol/L   Potassium 3.3 (L) 3.5 - 5.1 mmol/L   Chloride 105 98 - 111 mmol/L   CO2 23 22 - 32 mmol/L   Glucose, Bld 148 (H) 70 - 99 mg/dL    Comment: Glucose reference range applies only to samples taken after fasting for at least 8 hours.   BUN 8 6 - 20 mg/dL   Creatinine, Ser 0.58 0.44 - 1.00 mg/dL   Calcium 9.0 8.9 - 10.3 mg/dL   Total Protein 6.6 6.5 - 8.1 g/dL   Albumin 3.4 (L) 3.5 - 5.0 g/dL   AST 15 15 - 41 U/L   ALT 12 0 - 44 U/L   Alkaline Phosphatase 113 38 - 126 U/L   Total Bilirubin 0.5 0.3 - 1.2 mg/dL   GFR, Estimated >60 >60 mL/min    Comment: (NOTE) Calculated using the CKD-EPI Creatinine Equation (2021)    Anion gap 9 5 - 15    Comment: Performed at [facility], [address], [place], [place] [address]  Ethanol  Status: None   Collection Time: 08/25/22  2:18 PM  Result Value Ref Range   Alcohol, Ethyl (B) <10 <10 mg/dL    Comment: (NOTE) Lowest detectable limit for serum alcohol is 10 mg/dL.  For medical purposes only. Performed at [facility], [address] [place], [place] [address]   CBC with Diff     Status: None   Collection Time: 08/25/22  2:18 PM  Result Value Ref Range   WBC 10.3 4.0 - 10.5 K/uL   RBC 4.61 3.87 - 5.11 MIL/uL   Hemoglobin 14.2 12.0 - 15.0 g/dL   HCT 43.4 36.0 - 46.0 %   MCV 94.1 80.0 - 100.0 fL   MCH 30.8 26.0 - 34.0 pg   MCHC 32.7 30.0 - 36.0 g/dL   RDW 13.6 11.5 - 15.5 %   Platelets 261 150 - 400 K/uL   nRBC 0.0 0.0 - 0.2 %   Neutrophils Relative % 72 %   Neutro Abs 7.4 1.7 - 7.7 K/uL   Lymphocytes Relative 21 %   Lymphs Abs 2.2 0.7 - 4.0 K/uL   Monocytes Relative 4 %   Monocytes Absolute 0.4 0.1 - 1.0 K/uL   Eosinophils Relative 1 %   Eosinophils Absolute 0.1 0.0 - 0.5 K/uL   Basophils Relative 1 %   Basophils Absolute 0.1 0.0 -  0.1 K/uL   Immature Granulocytes 1 %   Abs Immature Granulocytes 0.05 0.00 - 0.07 K/uL    Comment: Performed at [facility], [address], [place], [place] [address]  Acetaminophen level     Status: Abnormal   Collection Time: 08/25/22  2:18 PM  Result Value Ref Range   Acetaminophen (Tylenol), Serum <10 (L) 10 - 30 ug/mL    Comment: (NOTE) Therapeutic concentrations vary significantly. A range of 10-30 ug/mL  may be an effective concentration for many patients. However, some  are best treated at concentrations outside of this range. Acetaminophen concentrations >150 ug/mL at 4 hours after ingestion  and >50 ug/mL at 12 hours after ingestion are often associated with  toxic reactions.  Performed at [facility], [address] [place], [place] [id number]   Salicylate level     Status: Abnormal   Collection Time: 08/25/22  2:18 PM  Result Value Ref Range   Salicylate Lvl [id number] (L) 7.0 - 30.0 mg/dL    Comment: Performed at [facility], [address] [place],  [address]  Urine rapid drug screen (hosp performed)     Status: None   Collection Time: 08/25/22  4:25 PM  Result Value Ref Range   Opiates NONE DETECTED NONE DETECTED   Cocaine NONE DETECTED NONE DETECTED   Benzodiazepines NONE DETECTED NONE DETECTED   Amphetamines NONE DETECTED NONE DETECTED   Tetrahydrocannabinol NONE DETECTED NONE DETECTED   Barbiturates NONE DETECTED NONE DETECTED    Comment: (NOTE) DRUG SCREEN FOR MEDICAL PURPOSES ONLY.  IF CONFIRMATION IS NEEDED FOR ANY PURPOSE, NOTIFY LAB WITHIN 5 DAYS.  LOWEST DETECTABLE LIMITS FOR URINE DRUG SCREEN Drug Class                     Cutoff (ng/mL) Amphetamine and metabolites    1000 Barbiturate and metabolites    200 Benzodiazepine                 200 Opiates and metabolites        300 Cocaine and metabolites        300 THC  8 Performed at [facility], [address] [place], [place] [address]   Urinalysis, Routine w  reflex microscopic -Urine, Clean Catch     Status: None   Collection Time: 08/25/22  4:25 PM  Result Value Ref Range   Color, Urine YELLOW YELLOW   APPearance CLEAR CLEAR   Specific Gravity, Urine 1.013 1.005 - 1.030   pH 6.0 5.0 - 8.0   Glucose, UA NEGATIVE NEGATIVE mg/dL   Hgb urine dipstick NEGATIVE NEGATIVE   Bilirubin Urine NEGATIVE NEGATIVE   Ketones, ur NEGATIVE NEGATIVE mg/dL   Protein, ur NEGATIVE NEGATIVE mg/dL   Nitrite NEGATIVE NEGATIVE   Leukocytes,Ua NEGATIVE NEGATIVE    Comment: Performed at [facility], [address] [place], [place] [address]  Resp panel by RT-PCR (RSV, Flu A&B, Covid) Anterior Nasal Swab     Status: None   Collection Time: 08/26/22  8:00 AM   Specimen: Anterior Nasal Swab  Result Value Ref Range   SARS Coronavirus 2 by RT PCR NEGATIVE NEGATIVE    Comment: (NOTE) SARS-CoV-2 target nucleic acids are NOT DETECTED.  The SARS-CoV-2 RNA is generally detectable in upper respiratory specimens during the acute phase of infection. The lowest concentration of SARS-CoV-2 viral copies this assay can detect is 138 copies/mL. A negative result does not preclude SARS-Cov-2 infection and should not be used as the sole basis for treatment or other patient management decisions. A negative result may occur with  improper specimen collection/handling, submission of specimen other than nasopharyngeal swab, presence of viral mutation(s) within the areas targeted by this assay, and inadequate number of viral copies(<138 copies/mL). A negative result must be combined with clinical observations, patient history, and epidemiological information. The expected result is Negative.  Fact Sheet for Patients:  EntrepreneurPulse.com.au  Fact Sheet for Healthcare Providers:  IncredibleEmployment.be  This test is no t yet approved or cleared by the [place] FDA and  has been authorized for detection and/or diagnosis of SARS-CoV-2  by FDA under an Emergency Use Authorization (EUA). This EUA will remain  in effect (meaning this test can be used) for the duration of the COVID-19 declaration under Section 564(b)(1) of the Act, 21 U.S.C.section 360bbb-3(b)(1), unless the authorization is terminated  or revoked sooner.       Influenza A by PCR NEGATIVE NEGATIVE   Influenza B by PCR NEGATIVE NEGATIVE    Comment: (NOTE) The Xpert Xpress SARS-CoV-2/FLU/RSV plus assay is intended as an aid in the diagnosis of influenza from Nasopharyngeal swab specimens and should not be used as a sole basis for treatment. Nasal washings and aspirates are unacceptable for Xpert Xpress SARS-CoV-2/FLU/RSV testing.  Fact Sheet for Patients: EntrepreneurPulse.com.au  Fact Sheet for Healthcare Providers: IncredibleEmployment.be  This test is not yet approved or cleared by the [place] FDA and has been authorized for detection and/or diagnosis of SARS-CoV-2 by FDA under an Emergency Use Authorization (EUA). This EUA will remain in effect (meaning this test can be used) for the duration of the COVID-19 declaration under Section 564(b)(1) of the Act, 21 U.S.C. section 360bbb-3(b)(1), unless the authorization is terminated or revoked.     Resp Syncytial Virus by PCR NEGATIVE NEGATIVE    Comment: (NOTE) Fact Sheet for Patients: EntrepreneurPulse.com.au  Fact Sheet for Healthcare Providers: IncredibleEmployment.be  This test is not yet approved or cleared by the [place] FDA and has been authorized for detection and/or diagnosis of SARS-CoV-2 by FDA under an Emergency Use Authorization (EUA). This EUA will remain in effect (meaning this  test can be used) for the duration of the COVID-19 declaration under Section 564(b)(1) of the Act, 21 U.S.C. section 360bbb-3(b)(1), unless the authorization is terminated or revoked.  Performed at [facility],  [address] [place], [place] [address]     Blood Alcohol level:  Lab Results  Component Value Date   [facility] <10 08/25/2022   ETH <10 [id number]    Metabolic Disorder Labs:  Lab Results  Component Value Date   HGBA1C 5.5 08/21/2020   MPG 111.15 08/21/2020   Lab Results  Component Value Date   PROLACTIN 5.2 08/21/2020   Lab Results  Component Value Date   CHOL 157 08/21/2020   TRIG 54 08/21/2020   HDL 44 08/21/2020   CHOLHDL 3.6 08/21/2020   VLDL 11 08/21/2020   LDLCALC 102 (H) 08/21/2020   LDLCALC 177 (H) 11/30/2019    Current Medications: Current Facility-Administered Medications  Medication Dose Route Frequency Provider Last Rate Last Admin   acetaminophen (TYLENOL) tablet 650 mg  650 mg Oral Q6H PRN Derrill Center, NP       alum & mag hydroxide-simeth (MAALOX/MYLANTA) 200-200-20 MG/5ML suspension 30 mL  30 mL Oral Q4H PRN Derrill Center, NP       hydrOXYzine (ATARAX) tablet 10 mg  10 mg Oral TID PRN Derrill Center, NP   10 mg at 08/26/22 2148   LORazepam (ATIVAN) tablet 2 mg  2 mg Oral TID PRN Derrill Center, NP       Or   LORazepam (ATIVAN) injection 2 mg  2 mg Intramuscular TID PRN Derrill Center, NP       magnesium hydroxide (MILK OF MAGNESIA) suspension 30 mL  30 mL Oral Daily PRN Derrill Center, NP       PTA Medications: Medications Prior to Admission  Medication Sig Dispense Refill Last Dose   busPIRone (BUSPAR) 10 MG tablet Take 1 tablet (10 mg total) by mouth 3 (three) times daily. (Patient not taking: Reported on 08/25/2022) 180 tablet 0    DULoxetine (CYMBALTA) 60 MG capsule TAKE 2 CAPSULES BY MOUTH DAILY (Patient not taking: Reported on 08/25/2022) 180 capsule 2    ibuprofen (ADVIL) 200 MG tablet Take 200-400 mg by mouth every 6 (six) hours as needed for moderate pain.      predniSONE (DELTASONE) 10 MG tablet Take 5 daily for 2 days followed by 4,3,2 and 1 for 2 days each. (Patient not taking: Reported on 08/25/2022) 30 tablet 0    QUEtiapine (SEROQUEL) 50  MG tablet TAKE 1 TABLET BY MOUTH EVERYDAY AT BEDTIME (Patient not taking: Reported on 08/25/2022) 90 tablet 0     Musculoskeletal: Strength & Muscle Tone: within normal limits Gait & Station: normal Patient leans: N/A  Psychiatric Specialty Exam:  Presentation  General Appearance:  Appropriate for Environment; Casual; Fairly Groomed  Eye Contact: Fair  Speech: Clear and Coherent; Normal Rate  Speech Volume: Normal  Handedness: Right   Mood and Affect  Mood: Depressed; Anxious  Affect: Blunt   Thought Process  Thought Processes: Coherent; Goal Directed; Linear  Descriptions of Associations:Intact  Orientation:Full (Time, Place and Person)  Thought Content:Logical  History of Schizophrenia/Schizoaffective disorder:No  Duration of Psychotic Symptoms:No data recorded Hallucinations:No data recorded Ideas of Reference:None  Suicidal Thoughts:No data recorded Homicidal Thoughts:No data recorded  Sensorium  Memory: Immediate Good  Judgment: Fair  Insight: Fair   Community education officer  Concentration: Fair  Attention Span: Fair  Recall: [place] of Knowledge: Good  Language: Good   Psychomotor Activity  Psychomotor Activity:No data recorded  Assets  Assets: Communication Skills; Desire for Improvement; Financial Resources/Insurance; Housing; Resilience; Social Support   Sleep  Sleep:No data recorded   Physical Exam: Physical Exam Vitals and nursing note reviewed.  Constitutional:      General: She is not in acute distress.    Appearance: She is normal weight.  HENT:     Head: Normocephalic and atraumatic.  Pulmonary:     Effort: Pulmonary effort is normal. No respiratory distress.  Musculoskeletal:        General: Normal range of motion.  Neurological:     General: No focal deficit present.    Review of Systems  Respiratory:  Negative for shortness of breath.   Cardiovascular:  Negative for chest pain.   Gastrointestinal:  Negative for abdominal pain, constipation and diarrhea.  Psychiatric/Behavioral:  Positive for depression and suicidal ideas. Negative for hallucinations, memory loss and substance abuse. The patient is not nervous/anxious and does not have insomnia.    Blood pressure (!) 82/67, pulse 81, temperature 98.8 F (37.1 C), temperature source Oral, resp. rate 16, height 4\' 11"  (1.499 m), weight 49.6 kg, SpO2 99 %. Body mass index is 22.1 kg/m.   Treatment Plan Summary: Daily contact with patient to assess and evaluate symptoms and progress in treatment and Medication management   ASSESSMENT: Gina Hendricks is a 62 yo female with PPHX of MDD, alcohol use disorder in sustained remission, and benzodiazepine use disorder presenting voluntarily to [facility] for worsening depression and suicidal ideation. Admitted on 08/26/2022.   Diagnoses / Active Problems: Dysthymia Alcohol use disorder, in sustained remission Benzodiazepine use disorder, in sustained remission Cannabis use disorder, in sustained remission  PLAN: Safety and Monitoring:  -- VOLUNTARY admission to inpatient psychiatric unit for safety, stabilization and treatment  -- Daily contact with patient to assess and evaluate symptoms and progress in treatment  -- Patient's case to be discussed in multi-disciplinary team meeting  -- Observation Level : q15 minute checks  -- Vital signs:  q12 hours  -- Precautions: suicide, elopement, and assault  2. Psychiatric Diagnoses and Treatment:  Start Cymbalta 30 mg daily for depression and anxiety As needed: Trazodone 50 mg for insomnia, melatonin 3 mg -- The risks/benefits/side-effects/alternatives to this medication were discussed in detail with the patient and time was given for questions. The patient consents to medication trial.              -- Encouraged patient to participate in unit milieu and in scheduled group therapies   -- Short Term Goals: Ability to identify  changes in lifestyle to reduce recurrence of condition will improve and Ability to verbalize feelings will improve  -- Long Term Goals: Improvement in symptoms so as ready for discharge    3. Medical Issues Being Addressed:   Tobacco Use Disorder  -- Nicotine patch 21mg /24 hours ordered  -- Smoking cessation encouraged  4. Discharge Planning:   -- Social work and case management to assist with discharge planning and identification of hospital follow-up needs prior to discharge  -- Estimated LOS: 5-7 days  -- Discharge Concerns: Need to establish a safety plan; Medication compliance and effectiveness  -- Discharge Goals: Return home with outpatient referrals for mental health follow-up including medication management/psychotherapy   I certify that inpatient services furnished can reasonably be expected to improve the patient's condition.    Signed: Dr. Jacques Navy, MD PGY-1, Psychiatry Residency  3/28/20247:27 AM

## 2022-08-27 NOTE — Progress Notes (Signed)
   08/27/22 0559  15 Minute Checks  Location Bedroom  Visual Appearance Calm  Behavior Sleeping  Sleep (Behavioral Health Patients Only)  Calculate sleep? (Click Yes once per 24 hr at 0600 safety check) Yes  Documented sleep last 24 hours 8.5

## 2022-08-27 NOTE — Progress Notes (Signed)
Pt given large cup of ice water and gingerale to encouraged po fluids.

## 2022-08-27 NOTE — Group Note (Signed)
Date:  08/27/2022 Time:  5:15 PM  Group Topic/Focus:  Orientation:   The focus of this group is to educate the patient on the purpose and policies of crisis stabilization and provide a format to answer questions about their admission.  The group details unit policies and expectations of patients while admitted.    Participation Level:  Active  Participation Quality:  Appropriate  Affect:  Appropriate  Cognitive:  Appropriate  Insight: Appropriate  Engagement in Group:  Lacking  Modes of Intervention:  Discussion  Additional Comments:     Jerrye Beavers 08/27/2022, 5:15 PM

## 2022-08-27 NOTE — Progress Notes (Signed)
   08/26/22 2100  Psych Admission Type (Psych Patients Only)  Admission Status Voluntary  Psychosocial Assessment  Patient Complaints Anxiety;Depression;Hopelessness  Eye Contact Fair  Facial Expression Anxious;Sad  Affect Blunted;Sad  Speech Soft  Interaction Forwards little  Motor Activity Fidgety  Appearance/Hygiene In scrubs  Behavior Characteristics Anxious;Guarded  Mood Depressed;Anxious  Thought Process  Coherency WDL  Content WDL  Delusions WDL  Perception WDL  Hallucination None reported or observed  Judgment WDL  Confusion WDL  Danger to Self  Current suicidal ideation? Denies  Agreement Not to Harm Self Yes  Description of Agreement Verbal contract  Danger to Others  Danger to Others None reported or observed

## 2022-08-27 NOTE — [facility] Suicide Risk Assessment (Signed)
[place] INPATIENT:  Family/Significant Other Suicide Prevention Education  Suicide Prevention Education:  Patient Refusal for Family/Significant Other Suicide Prevention Education: The patient Gina Hendricks has refused to provide written consent for family/significant other to be provided Family/Significant Other Suicide Prevention Education during admission and/or prior to discharge.  Physician notified.  Abigal Choung S Arminda Foglio 08/27/2022, 1:55 PM

## 2022-08-27 NOTE — B[facility] Group Notes (Signed)
PsychoEducational Group Patients were asked to reflect on how to promote wellbeing with healthy lifestyle choices. Education regarding healthy sleep habits, and lifestyle impact mental health. Conversely patients were then asked to identify warning signs of when they are not well and how to integrate healthy coping skills/lifestyle to help.  Patient attended and was appropriate.

## 2022-08-27 NOTE — [facility] Suicide Risk Assessment (Signed)
Suicide Risk Assessment  Admission Assessment    Gina Hendricks Admission Suicide Risk Assessment   Nursing information obtained from:  Patient Demographic factors:  Caucasian, Low socioeconomic status, Living alone, Unemployed Current Mental Status:  Suicidal ideation indicated by patient Loss Factors:  Decrease in vocational status Historical Factors:  Impulsivity Risk Reduction Factors:  Positive social support  Total Time spent with patient: 45 minutes Principal Problem: MDD (major depressive disorder) Diagnosis:  Principal Problem:   MDD (major depressive disorder)   Subjective Data:   CC: "I'm in the hole"  Gina Hendricks is a 62 yo female with PPHX of MDD, alcohol use disorder in sustained remission, and benzodiazepine use disorder presenting voluntarily to [facility] for worsening depression and suicidal ideation. Admitted on 08/26/2022.  PRN medication prior to evaluation:  Atarax 1X  HPI:  Patient evaluated on the unit, psychomotor retardation appreciated during evaluation.  Patient is unable to maintain eye contact, states "I am in the whole".  Patient describes 2-year history of persistent and severe depression, reports she has become [place].  She lives [place].  Reports that she has not been the same since her mother passed away 2 years ago, at that time she was hospitalized at [facility] for worsening depression and suicidal ideation with plan to overdose on her deceased mother's medications.  During this hospitalization, she reports passive suicidal ideation, denies any plan or intent.  Reports that as a [place], she has strong religious beliefs against suicide, believes she would not go to heaven if she were to commit suicide.  For the past 2 years she reports feeling like she is in deep hole of depression, endorsing symptoms of diminished appetite, poor sleep hygiene, guilt, hopelessness, worthlessness, and psychomotor retardation.  Patient  denies any inciting event that led to her hospitalization, simply states "my friends got tired of hearing me complaining about my depression".  Patient is perseverative and ruminative about her depressed mood, states "what he can to do to help me?  I do not think there is anything you could do".  Patient also reports a 30-year history of Xanax use, believes this has contributed to her persistent depression.  Reports the last use of Xanax was in 2022, confirmed in chart review.  On assessment, patient denies any history of expansive mood or energy.  Does not meet criteria for any manic or hypomanic symptoms in the past.  She does report constantly worrying, feels persistently anxious and is unable to control her worries.  Only reports restlessness associated with her anxiety.  Patient denies ever experiencing auditory or visual hallucinations.  Denies any history of paranoid ideations.  Denies any history of delusional thought processes such as thought insertion, thought withdrawal, and ideas of reference.    Past Psychiatric Hx: Current Psychiatrist: Denies Current Therapist: Denies Previous Psych Diagnoses:  MDD, recurrent, severe, with psychosis Anxiety disorder, unspecified Benzodiazepine use disorder Current psychiatric medications: Denies Psychiatric medication history: Effexor-reports this is the most recent medication change she took.  Reports no improvement in symptoms Paxil Zoloft Prozac Wellbutrin Prior inpatient treatment: [facility] hospitalization 08/18/2020-08/27/2020 "The patient reported having suicidal ideation with a plan to overdose on her recently deceased mother's medications.  The patient was not taking any medications on admission having weaned herself off of Xanax with the last dose reportedly on July 12, 2020. " Medications at discharge include: Escitalopram 10 mg, hydroxyzine 25 mg, quetiapine 25 mg Current/prior outpatient treatment: History of suicide: as  described  in the HPI History of homicide or aggression: Denies   Substance Abuse Hx: Alcohol: last use 3 years ago Tobacco: 2 pack/day for 40 years Cannabis: Last use 1 year ago Rx drug abuse: Detailed in HPI  Past Medical History: PCP: Denies [id number] Meds: Denies ALL:  Denies Hosp: Denies Surgeries: Denies Trauma:Denies Seizures:Denies  Family History: Reports alcohol use on mother side.  Denies any history of suicide in her family.  Social History: Living: Lives alone in [place] Work: Unemployed, receiving SSA Marital Status: Single Children: Denies Abuse: None except as detailed in HPI Legal: Denies Nature conservation officer: Denies  Continued Clinical Symptoms:  Alcohol Use Disorder Identification Test Final Score (AUDIT): 0 The "Alcohol Use Disorders Identification Test", Guidelines for Use in Primary Care, Second Edition.  World Pharmacologist [facility]). Score between 0-7:  no or low risk or alcohol related problems. Score between 8-15:  moderate risk of alcohol related problems. Score between 16-19:  high risk of alcohol related problems. Score 20 or above:  warrants further diagnostic evaluation for alcohol dependence and treatment.   CLINICAL FACTORS:   Dysthymia   Musculoskeletal: Strength & Muscle Tone: within normal limits Gait & Station: normal Patient leans: N/A  Psychiatric Specialty Exam: See H&P  Physical Exam: See H&P   COGNITIVE FEATURES THAT CONTRIBUTE TO RISK:  Thought constriction (tunnel vision)    SUICIDE RISK:   Moderate:  Frequent suicidal ideation with limited intensity, and duration, some specificity in terms of plans, no associated intent, good self-control, limited dysphoria/symptomatology, some risk factors present, and identifiable protective factors, including available and accessible social support.  PLAN OF CARE: See H&P for assessment and plan.   I certify that inpatient services furnished can reasonably be expected to improve the  patient's condition.   Christene Slates, MD 08/27/2022, 7:27 AM

## 2022-08-27 NOTE — Group Note (Signed)
Date:  08/27/2022 Time:  5:11 PM  Group Topic/Focus:  Spirituality:   The focus of this group is to discuss how one's spirituality can aide in recovery.    Participation Level:  Active  Participation Quality:  Appropriate  Affect:  Appropriate  Cognitive:  Appropriate  Insight: Appropriate  Engagement in Group:  Engaged  Modes of Intervention:  Exploration  Additional Comments:     Jerrye Beavers 08/27/2022, 5:11 PM

## 2022-08-27 NOTE — Progress Notes (Addendum)
Pt presents with very depressed mood,affect congruent. Dianna reports '' there's nothing you can do to help me. I've been in this hole for so long. I don't know how to get out. There's no getting better. '' When asked what writer could do to help her situation in terms of advocating for new therapy, grief counseling, medications or alternate plans to help with depression pt states '' send me to heaven, that's what you can do . ''  Pt is clearly irritable, depressed and withdrawn and expressed hopelessness. Pt denied being suicidal with Probation officer however making veiled statements as noted above. Above discussed with treatment team including MD, NP and SW. Pt has been safe on the unit. Pt denies any other acute concerns at this time.Pt did complete self inventory and rates her depression, hopelessness and anxiety at 10/10 on scale, 10 being worst, 0 being none. Pt did not indicate a goal for today. Will con't to monitor.

## 2022-08-27 NOTE — [facility] Group Notes (Signed)
[place] Group Notes:  (Nursing/MHT/Case Management/Adjunct)  Date:  08/27/2022  Time:  8:37 PM  Type of Therapy:  Group Therapy  Participation Level:  Active  Participation Quality:  Appropriate  Affect:  Appropriate  Cognitive:  Appropriate  Insight:  Appropriate  Engagement in Group:  Engaged  Modes of Intervention:  Discussion  Summary of Progress/Problems: Pt attended wrap up group  Chase Picket 08/27/2022, 8:37 PM

## 2022-08-27 NOTE — Plan of Care (Signed)
  Problem: Self-Concept: Goal: Will verbalize positive feelings about self Outcome: Progressing Goal: Level of anxiety will decrease Outcome: Progressing   Problem: Education: Goal: Ability to make informed decisions regarding treatment will improve Outcome: Progressing   Problem: Coping: Goal: Coping ability will improve Outcome: Progressing   Problem: Self-Concept: Goal: Ability to disclose and discuss suicidal ideas will improve Outcome: Progressing Goal: Will verbalize positive feelings about self Outcome: Progressing

## 2022-08-27 NOTE — BHH Counselor (Signed)
Adult Comprehensive Assessment  Patient ID: Gina Hendricks, female   DOB: 1960/10/20, 62 y.o.   MRN: [id number]  Information Source: Information source: Patient  Current Stressors:  Patient states their primary concerns and needs for treatment are:: Pt presnted with depressed mood and blunted afffect. and failed to answer many of the assessment questions Patient states their goals for this hospitilization and ongoing recovery are:: Medication stabilization Educational / Learning stressors: pt denied Employment / Job issues: pt  is unemployed Family Relationships: pt denied Museum/gallery curator / Lack of resources (include bankruptcy): denied Housing / Lack of housing: none reported Physical health (include injuries & life threatening diseases): pt denied Social relationships: pt denied Substance abuse: pt denied Bereavement / Loss: pt states that her Mother passed away approximately 1year ago  Living/Environment/Situation:  Living Arrangements: Alone Living conditions (as described by patient or guardian): My house is nasty, I don't have the energy to clean" Who else lives in the home?: I live alone How long has patient lived in current situation?: years What is atmosphere in current home: Chaotic  Family History:  Are you sexually active?: No What is your sexual orientation?: "neither" Has your sexual activity been affected by drugs, alcohol, medication, or emotional stress?: UTA Does patient have children?: No  Childhood History:  By whom was/is the patient raised?: Both parents Additional childhood history information: Pt reports that her mother died in 07/17/2020 Description of patient's relationship with caregiver when they were a child: "wonderful" How were you disciplined when you got in trouble as a child/adolescent?: "very well" Did patient suffer any verbal/emotional/physical/sexual abuse as a child?: No Did patient suffer from severe childhood neglect?: No Has patient ever  been sexually abused/assaulted/raped as an adolescent or adult?: No Was the patient ever a victim of a crime or a disaster?: No Witnessed domestic violence?: No Has patient been affected by domestic violence as an adult?: No  Education:  Highest grade of school patient has completed: DNA Currently a Ship broker?: No Learning disability?: No  Employment/Work Situation:   Employment Situation: On disability Why is Patient on Disability: Due to mental health How Long has Patient Been on Disability: 2 years Patient's Job has Been Impacted by Current Illness: No What is the Longest Time Patient has Held a Job?: UTA Where was the Patient Employed at that Time?: UTA Has Patient ever Been in the [employer]?: No  Financial Resources:   Museum/gallery curator resources: Teacher, early years/pre  Alcohol/Substance Abuse:   What has been your use of drugs/alcohol within the last 12 months?: DNA If attempted suicide, did drugs/alcohol play a role in this?: No Alcohol/Substance Abuse Treatment Hx: Denies past history Has alcohol/substance abuse ever caused legal problems?: No  Social Support System:   Describe Community Support System: DNA Type of faith/religion: DNA How does patient's faith help to cope with current illness?: I can't hope for anything except my life ending  Leisure/Recreation:   Do You Have Hobbies?: No  Strengths/Needs:   What is the patient's perception of their strengths?: DNA Patient states they can use these personal strengths during their treatment to contribute to their recovery: DNA Patient states these barriers may affect/interfere with their treatment: My mental Patient states these barriers may affect their return to the community: My mental health Other important information patient would like considered in planning for their treatment: I just want to get back to my normal self  Discharge Plan:   Does patient have financial barriers related to discharge medications?: No Patient  description of barriers related to discharge medications: Just my mental health  Summary/Recommendations:   Summary and Recommendations (to be completed by the evaluator): Pt presents with very depressed mood,affect congruent. Gina Hendricks reports '' there's nothing you can do to help me. I've been in this hole for so long. I don't know how to get out. There's no getting better. '' When asked what writer could do to help her situation in terms of advocating for new therapy, grief counseling, medications or alternate plans to help with depression pt states '' send me to heaven, that's what you can do . ''   Pt is clearly irritable, depressed and withdrawn and expressed hopelessness. Pt denied being suicidal with CSW, but made statements statements asking to go to heaven to be with her deceased mother Pt has been safe on the unit. Pt denies any other acute concerns at this time.Pt did complete self inventory and rates her depression, hopelessness and anxiety at 10/10 on scale, 10 being worst, 0 being none. Pt denied having a goal for today but did attend group at 1pm. While here, Gina Hendricks can benefit from crisis stabilization, medication management, therapeutic milieu, and referrals for services.  [place]. 08/27/2022

## 2022-08-28 ENCOUNTER — Encounter (HOSPITAL_COMMUNITY): Payer: Self-pay

## 2022-08-28 NOTE — Plan of Care (Signed)
  Problem: Education: Goal: Mental status will improve Outcome: Progressing Goal: Verbalization of understanding the information provided will improve Outcome: Progressing   Problem: Activity: Goal: Sleeping patterns will improve Outcome: Progressing   Problem: Education: Goal: Knowledge of the prescribed therapeutic regimen will improve Outcome: Progressing   Problem: Coping: Goal: Coping ability will improve Outcome: Progressing Goal: Will verbalize feelings Outcome: Progressing

## 2022-08-28 NOTE — Progress Notes (Signed)
   08/28/22 0300  Psych Admission Type (Psych Patients Only)  Admission Status Voluntary  Psychosocial Assessment  Patient Complaints Anxiety;Depression  Eye Contact Fair  Facial Expression Anxious;Flat  Affect Blunted  Speech Soft  Interaction Forwards little  Motor Activity Fidgety  Appearance/Hygiene Unremarkable  Behavior Characteristics Guarded  Mood Depressed  Thought Process  Coherency WDL  Content WDL  Delusions WDL  Perception WDL  Hallucination None reported or observed  Judgment WDL  Confusion WDL  Danger to Self  Current suicidal ideation? Denies  Agreement Not to Harm Self Yes  Description of Agreement Verbal  Danger to Others  Danger to Others None reported or observed

## 2022-08-28 NOTE — Progress Notes (Signed)
Patient stated she feels she is chronically depressed, feels terrible.  For two years she shut herself in her house, isolated, not that she wanted to.  Feels like she cannot function, feels like she is shut down.  Does not even know how many baths she takes a week.  Has been taking xanax 2 mg three times a day.  Stopped taking xanax in 2022 without any professional help.  Took xanax approximately 30 years, started in the 1990's.  Dr. Toy Care prescribed continuously for her.  Her sister that lives in [place] is worried about her.  Someone needs to contact her sister to let her know about patient.  In 2020 stopped working at [employer] in [place].  Has been isolating since then.  Patient has never married and no children.  After mother's death in 08-18-2020, patient "really started hibernating then."  Patient denied alcohol and drug use.  Feels she cannot function on a daily basis that everyone else does.  Does not cook.  Neighbor brings groceries to her.  The past month she has one meal every two days brought to her.  Stated she just eats bread.

## 2022-08-28 NOTE — Progress Notes (Signed)
   08/28/22 0557  15 Minute Checks  Location Bedroom  Visual Appearance Calm  Behavior Sleeping  Sleep (Behavioral Health Patients Only)  Calculate sleep? (Click Yes once per 24 hr at 0600 safety check) Yes  Documented sleep last 24 hours 6.5

## 2022-08-28 NOTE — Group Note (Signed)
Recreation Therapy Group Note   Group Topic:Stress Management  Group Date: 08/28/2022 Start Time: 0930 End Time: 0955 Facilitators: Miray Mancino-McCall, LRT,CTRS Location: [address] Dayroom   Goal Area(s) Addresses:  Patient will identify positive stress management techniques. Patient will identify benefits of using stress management post d/c.  Group Description:  Meditation.  LRT explained to patients what group would consist of, they should focus on their breathing and be in a comfortable position.  LRT proceeded to play a meditation called 'Stop Holding Yourself Back'.  The meditation wanted patients to identify habits they that have prevented them from the life they want for themselves.  It also encouraged to hold onto the positive image they have of their future selves and to working to get to that point.   Affect/Mood: Flat   Participation Level: Engaged   Participation Quality: Independent   Behavior: Appropriate   Speech/Thought Process: Focused   Insight: Good   Judgement: Good   Modes of Intervention: App   Patient Response to Interventions:  Attentive   Education Outcome:  Acknowledges education and In group clarification offered    Clinical Observations/Individualized Feedback: Pt appeared to be a little down but participated in the activity anyway.  Pt was appropriate and attentive throughout group session.    Plan: Continue to engage patient in RT group sessions 2-3x/week.   Amahri Dengel-McCall, LRT,CTRS 08/28/2022 10:44 AM

## 2022-08-28 NOTE — Plan of Care (Signed)
Nurse discussed anxiety, depression and coping skills with patient.  

## 2022-08-28 NOTE — BH IP Treatment Plan (Signed)
Interdisciplinary Treatment and Diagnostic Plan Update  08/28/2022 Time of Session: [place] MRN: [id number]  Principal Diagnosis: MDD (major depressive disorder)  Secondary Diagnoses: Principal Problem:   MDD (major depressive disorder)   Current Medications:  Current Facility-Administered Medications  Medication Dose Route Frequency Provider Last Rate Last Admin   acetaminophen (TYLENOL) tablet 650 mg  650 mg Oral Q6H PRN Derrill Center, NP       alum & mag hydroxide-simeth (MAALOX/MYLANTA) 200-200-20 MG/5ML suspension 30 mL  30 mL Oral Q4H PRN Derrill Center, NP       DULoxetine (CYMBALTA) DR capsule 30 mg  30 mg Oral Daily Carrion-Carrero, Caryl Ada, MD   30 mg at 08/28/22 0818   hydrOXYzine (ATARAX) tablet 10 mg  10 mg Oral TID PRN Derrill Center, NP   10 mg at 08/28/22 [id number]   LORazepam (ATIVAN) tablet 2 mg  2 mg Oral TID PRN Derrill Center, NP       Or   LORazepam (ATIVAN) injection 2 mg  2 mg Intramuscular TID PRN Derrill Center, NP       magnesium hydroxide (MILK OF MAGNESIA) suspension 30 mL  30 mL Oral Daily PRN Derrill Center, NP       melatonin tablet 3 mg  3 mg Oral QHS PRN Carrion-Carrero, Margely, MD       nicotine (NICODERM CQ - dosed in mg/24 hours) patch 21 mg  21 mg Transdermal Daily Carrion-Carrero, Margely, MD   21 mg at 08/28/22 0819   traZODone (DESYREL) tablet 50 mg  50 mg Oral QHS PRN Christene Slates, MD   50 mg at 08/27/22 2055   PTA Medications: Medications Prior to Admission  Medication Sig Dispense Refill Last Dose   busPIRone (BUSPAR) 10 MG tablet Take 1 tablet (10 mg total) by mouth 3 (three) times daily. (Patient not taking: Reported on 08/25/2022) 180 tablet 0    DULoxetine (CYMBALTA) 60 MG capsule TAKE 2 CAPSULES BY MOUTH DAILY (Patient not taking: Reported on 08/25/2022) 180 capsule 2    ibuprofen (ADVIL) 200 MG tablet Take 200-400 mg by mouth every 6 (six) hours as needed for moderate pain.      predniSONE (DELTASONE) 10 MG  tablet Take 5 daily for 2 days followed by 4,3,2 and 1 for 2 days each. (Patient not taking: Reported on 08/25/2022) 30 tablet 0    QUEtiapine (SEROQUEL) 50 MG tablet TAKE 1 TABLET BY MOUTH EVERYDAY AT BEDTIME (Patient not taking: Reported on 08/25/2022) 90 tablet 0     Patient Stressors: Financial difficulties   Medication change or noncompliance    Patient Strengths: Average or above average intelligence  Capable of independent living  Communication skills   Treatment Modalities: Medication Management, Group therapy, Case management,  1 to 1 session with clinician, Psychoeducation, Recreational therapy.   Physician Treatment Plan for Primary Diagnosis: MDD (major depressive disorder) Long Term Goal(s): Improvement in symptoms so as ready for discharge   Short Term Goals: Ability to identify changes in lifestyle to reduce recurrence of condition will improve Ability to verbalize feelings will improve  Medication Management: Evaluate patient's response, side effects, and tolerance of medication regimen.  Therapeutic Interventions: 1 to 1 sessions, Unit Group sessions and Medication administration.  Evaluation of Outcomes: Progressing  Physician Treatment Plan for Secondary Diagnosis: Principal Problem:   MDD (major depressive disorder)  Long Term Goal(s): Improvement in symptoms so as ready for discharge   Short Term Goals: Ability to identify changes  in lifestyle to reduce recurrence of condition will improve Ability to verbalize feelings will improve     Medication Management: Evaluate patient's response, side effects, and tolerance of medication regimen.  Therapeutic Interventions: 1 to 1 sessions, Unit Group sessions and Medication administration.  Evaluation of Outcomes: Progressing   RN Treatment Plan for Primary Diagnosis: MDD (major depressive disorder) Long Term Goal(s): Knowledge of disease and therapeutic regimen to maintain health will improve  Short Term Goals:  Ability to remain free from injury will improve, Ability to verbalize frustration and anger appropriately will improve, Ability to demonstrate self-control, Ability to participate in decision making will improve, Ability to verbalize feelings will improve, Ability to disclose and discuss suicidal ideas, Ability to identify and develop effective coping behaviors will improve, and Compliance with prescribed medications will improve  Medication Management: RN will administer medications as ordered by provider, will assess and evaluate patient's response and provide education to patient for prescribed medication. RN will report any adverse and/or side effects to prescribing provider.  Therapeutic Interventions: 1 on 1 counseling sessions, Psychoeducation, Medication administration, Evaluate responses to treatment, Monitor vital signs and CBGs as ordered, Perform/monitor CIWA, COWS, AIMS and Fall Risk screenings as ordered, Perform wound care treatments as ordered.  Evaluation of Outcomes: Progressing   LCSW Treatment Plan for Primary Diagnosis: MDD (major depressive disorder) Long Term Goal(s): Safe transition to appropriate next level of care at discharge, Engage patient in therapeutic group addressing interpersonal concerns.  Short Term Goals: Engage patient in aftercare planning with referrals and resources, Increase social support, Increase ability to appropriately verbalize feelings, Increase emotional regulation, Facilitate acceptance of mental health diagnosis and concerns, Facilitate patient progression through stages of change regarding substance use diagnoses and concerns, Identify triggers associated with mental health/substance abuse issues, and Increase skills for wellness and recovery  Therapeutic Interventions: Assess for all discharge needs, 1 to 1 time with Social worker, Explore available resources and support systems, Assess for adequacy in community support network, Educate family and  significant other(s) on suicide prevention, Complete Psychosocial Assessment, Interpersonal group therapy.  Evaluation of Outcomes: Progressing   Progress in Treatment: Attending groups: Yes. Participating in groups: Yes. Taking medication as prescribed: Yes. Toleration medication: Yes. Family/Significant other contact made: No, will contact:  Consents Denied Patient understands diagnosis: Yes. Discussing patient identified problems/goals with staff: Yes. Medical problems stabilized or resolved: Yes. Denies suicidal/homicidal ideation: Yes. Issues/concerns per patient self-inventory: Yes. Other:   New problem(s) identified: No, Describe:  None Reported  New Short Term/Long Term Goal(s):  Patient Goals:  Medication Stabilization  Discharge Plan or Barriers: medication stabilization, elimination of SI thoughts, development of comprehensive mental wellness plan.   Reason for Continuation of Hospitalization: Anxiety Depression Medication stabilization Withdrawal symptoms  Estimated Length of Stay: 3-7 Days  Last 3 [place] Suicide Severity Risk Score: Flowsheet Row Admission (Current) from 08/26/2022 in [place] [facility] ED from 08/25/2022 in [facility] Department at [facility] ED from 08/05/2022 in [place] Low Risk Moderate Risk Low Risk       Last [facility] 2/9 Scores:    03/16/2022   10:12 AM 01/12/2022    8:09 AM 12/03/2021   12:10 PM  Depression screen PHQ 2/9  Decreased Interest 3 3 2   Down, Depressed, Hopeless 3 3 3   PHQ - 2 Score 6 6 5   Altered sleeping 3 3   Tired, decreased energy 3 3   Change in appetite  3 3   Feeling bad or failure about yourself  3 3   Trouble concentrating 3 0   Moving slowly or fidgety/restless 3 3   Suicidal thoughts 1 0   PHQ-9 Score 25 21   Difficult doing work/chores Extremely dIfficult Extremely dIfficult     medication stabilization,  elimination of SI thoughts, development of comprehensive mental wellness plan.    Scribe for Treatment Team: Windle Guard, LCSW 08/28/2022 12:30 PM

## 2022-08-28 NOTE — Progress Notes (Signed)
[facility] MD Progress Note  08/28/2022 10:32 AM Gina Hendricks  MRN:  [id number]  Principal Problem: MDD (major depressive disorder) Diagnosis: Principal Problem:   MDD (major depressive disorder)   Reason for Admission:  Gina Hendricks is a 63 yo female with PPHX of MDD, alcohol use disorder in sustained remission, and benzodiazepine use disorder presenting voluntarily to [facility] for worsening depression and suicidal ideation. Admitted on 08/26/2022.  (admitted on 08/26/2022, total  LOS: 2 days )  Yesterday, the psychiatry team made following recommendations:  Start Cymbalta 30 mg daily for depression and anxiety As needed: Trazodone 50 mg for insomnia, melatonin 3 m  Information Obtained Today During Patient Interview:  Patient evaluated on the unit, affect appears depressed and irritable as she approaches this interviewer.  Patient immediately states "I am all good, you fixed me" in a tone I interpreted as sarcastic.  When asked to elaborate, patient states that her depression has been cured and she feels fine.  Patient declined to elaborate on how she had improved so quickly, simply stated "I do not know what to tell you".  When asked about anxiety, she states it is still present, rates it a 10/10, where 10 is the worst.  She says her appetite has improved slightly.  Continues to report poor sleep, sleeping 4 hours last night.  On assessment, patient denies suicidal ideation.  She denies homicidal ideation.  Denies auditory visual hallucinations.  Denies paranoid ideations.  Denies thought insertion, thought withdrawal, and ideas of reference.  Patient denies any side effects to currently prescribed psychiatric medications.  Denies any somatic complaints.   Past Psychiatric Hx: Current Psychiatrist: Denies Current Therapist: Denies Previous Psych Diagnoses:  MDD, recurrent, severe, with psychosis Anxiety disorder, unspecified Benzodiazepine use disorder Current psychiatric medications:  Denies Psychiatric medication history: Effexor-reports this is the most recent medication change she took.  Reports no improvement in symptoms Paxil Zoloft Prozac Wellbutrin Prior inpatient treatment: [facility] hospitalization 08/18/2020-08/27/2020 "The patient reported having suicidal ideation with a plan to overdose on her recently deceased mother's medications.  The patient was not taking any medications on admission having weaned herself off of Xanax with the last dose reportedly on July 12, 2020. " Medications at discharge include: Escitalopram 10 mg, hydroxyzine 25 mg, quetiapine 25 mg Current/prior outpatient treatment: History of suicide: as described in the HPI History of homicide or aggression: Denies Past Medical History:  Past Medical History:  Diagnosis Date   Anxiety    Anxiety disorder, unspecified 08/23/2020   Depression    Heart murmur    Family History:  Family History  Problem Relation Age of Onset   Arthritis Mother    Vision loss Mother    Heart disease Father    Alcohol abuse Brother    Depression Brother    Heart disease Maternal Grandmother    Alcohol abuse Maternal Grandfather    Heart disease Paternal Grandfather    Alcohol abuse Brother    Family History: Reports alcohol use on mother side.  Denies any history of suicide in her family.   Social History: Living: Lives alone in [place] Work: Unemployed, receiving SSA Marital Status: Single Children: Denies Abuse: None except as detailed in HPI Legal: Denies Nature conservation officer: Denies  Current Medications: Current Facility-Administered Medications  Medication Dose Route Frequency Provider Last Rate Last Admin   acetaminophen (TYLENOL) tablet 650 mg  650 mg Oral Q6H PRN Derrill Center, NP       alum & mag hydroxide-simeth (MAALOX/MYLANTA) 200-200-20  MG/5ML suspension 30 mL  30 mL Oral Q4H PRN Derrill Center, NP       DULoxetine (CYMBALTA) DR capsule 30 mg  30 mg Oral Daily Carrion-Carrero, Xolani Degracia, MD    30 mg at 08/28/22 0818   hydrOXYzine (ATARAX) tablet 10 mg  10 mg Oral TID PRN Derrill Center, NP   10 mg at 08/28/22 [id number]   LORazepam (ATIVAN) tablet 2 mg  2 mg Oral TID PRN Derrill Center, NP       Or   LORazepam (ATIVAN) injection 2 mg  2 mg Intramuscular TID PRN Derrill Center, NP       magnesium hydroxide (MILK OF MAGNESIA) suspension 30 mL  30 mL Oral Daily PRN Derrill Center, NP       melatonin tablet 3 mg  3 mg Oral QHS PRN Carrion-Carrero, Stefanie Hodgens, MD       nicotine (NICODERM CQ - dosed in mg/24 hours) patch 21 mg  21 mg Transdermal Daily Carrion-Carrero, Lindsey Demonte, MD   21 mg at 08/28/22 0819   traZODone (DESYREL) tablet 50 mg  50 mg Oral QHS PRN Christene Slates, MD   50 mg at 08/27/22 2055    Lab Results: No results found for this or any previous visit (from the past 48 hour(s)).  Blood Alcohol level:  Lab Results  Component Value Date   ETH <10 08/25/2022   ETH <10 [id number]    Metabolic Labs: Lab Results  Component Value Date   HGBA1C 5.5 08/21/2020   MPG 111.15 08/21/2020   Lab Results  Component Value Date   PROLACTIN 5.2 08/21/2020   Lab Results  Component Value Date   CHOL 157 08/21/2020   TRIG 54 08/21/2020   HDL 44 08/21/2020   CHOLHDL 3.6 08/21/2020   VLDL 11 08/21/2020   LDLCALC 102 (H) 08/21/2020   LDLCALC 177 (H) 11/30/2019    Sleep:Sleep: Poor Number of Hours of Sleep: 4   Physical Findings: AIMS: No  CIWA:    COWS:     Psychiatric Specialty Exam:  Presentation  General Appearance: Appropriate for Environment; Casual; Fairly Groomed  Eye Contact:Fleeting  Speech:Clear and Coherent; Normal Rate  Speech Volume:Normal  Handedness:Right   Mood and Affect  Mood:-- ("Fine, all better")  Affect:Non-Congruent; Depressed; Flat   Thought Process  Thought Processes:Coherent  Descriptions of Associations:Intact  Orientation:Full (Time, Place and Person)  Thought Content:Logical; WDL  History of  Schizophrenia/Schizoaffective disorder:No  Duration of Psychotic Symptoms:No data recorded Hallucinations:Hallucinations: None  Ideas of Reference:None  Suicidal Thoughts:Suicidal Thoughts: No SI Passive Intent and/or Plan: Without Means to Carry Out; Without Access to Means  Homicidal Thoughts:Homicidal Thoughts: No   Sensorium  Memory:Immediate Fair; Remote Fair; Recent Fair  Judgment:Poor  Insight:Lacking   Executive Functions  Concentration:Fair  Attention Span:Fair  [place]   Psychomotor Activity  Psychomotor Activity:Psychomotor Activity: Restlessness   Assets  Assets:Communication Skills; Desire for Improvement; Resilience   Sleep  Sleep:Sleep: Poor Number of Hours of Sleep: 4    Physical Exam: Physical Exam Vitals and nursing note reviewed.  Constitutional:      General: She is not in acute distress.    Appearance: She is not ill-appearing.  HENT:     Head: Normocephalic and atraumatic.  Pulmonary:     Effort: Pulmonary effort is normal. No respiratory distress.  Musculoskeletal:        General: Normal range of motion.    Review of Systems  Respiratory:  Negative for shortness of breath.   Cardiovascular:  Negative for chest pain.  Gastrointestinal:  Negative for abdominal pain, constipation and diarrhea.  Psychiatric/Behavioral:  Negative for depression, hallucinations, memory loss, substance abuse and suicidal ideas. The patient is nervous/anxious. The patient does not have insomnia.    Blood pressure 94/65, pulse 96, temperature 97.8 F (36.6 C), temperature source Oral, resp. rate 16, height 4\' 11"  (1.499 m), weight 49.6 kg, SpO2 98 %. Body mass index is 22.1 kg/m.  Treatment Plan Summary: Daily contact with patient to assess and evaluate symptoms and progress in treatment and Medication management   ASSESSMENT: Finn Weins. Brin is a 62 yo female with PPHX of MDD, alcohol use disorder in  sustained remission, and benzodiazepine use disorder presenting voluntarily to [facility] for worsening depression and suicidal ideation. Admitted on 08/26/2022.     Diagnoses / Active Problems: Dysthymia Alcohol use disorder, in sustained remission Benzodiazepine use disorder, in sustained remission Cannabis use disorder, in sustained remission   PLAN: Safety and Monitoring:             -- VOLUNTARY admission to inpatient psychiatric unit for safety, stabilization and treatment             -- Daily contact with patient to assess and evaluate symptoms and progress in treatment             -- Patient's case to be discussed in multi-disciplinary team meeting             -- Observation Level : q15 minute checks             -- Vital signs:  q12 hours             -- Precautions: suicide, elopement, and assault   2. Psychiatric Diagnoses and Treatment:  Start Cymbalta 30 mg daily for depression and anxiety As needed: Trazodone 50 mg for insomnia, melatonin 3 mg -- The risks/benefits/side-effects/alternatives to this medication were discussed in detail with the patient and time was given for questions. The patient consents to medication trial.              -- Encouraged patient to participate in unit milieu and in scheduled group therapies              -- Short Term Goals: Ability to identify changes in lifestyle to reduce recurrence of condition will improve and Ability to verbalize feelings will improve             -- Long Term Goals: Improvement in symptoms so as ready for discharge                3. Medical Issues Being Addressed:              Tobacco Use Disorder             -- Nicotine patch 21mg /24 hours ordered             -- Smoking cessation encouraged   4. Discharge Planning:              -- Social work and case management to assist with discharge planning and identification of hospital follow-up needs prior to discharge             -- Estimated LOS: 5-7 days             -- Discharge  Concerns: Need to establish a safety plan; Medication compliance and effectiveness             --  Discharge Goals: Return home with outpatient referrals for mental health follow-up including medication management/psychotherapy    I certify that inpatient services furnished can reasonably be expected to improve the patient's condition.    Dr. Jacques Navy, MD PGY-1, Psychiatry Residency  3/29/202410:32 AM

## 2022-08-28 NOTE — BHH Group Notes (Signed)
[place] Group Notes:  (Nursing/MHT/Case Management/Adjunct)  Date:  08/28/2022  Time:  8:13 PM  Type of Therapy:   AA  Participation Level:  Active  Participation Quality:  Appropriate  Affect:  Appropriate  Cognitive:  Appropriate  Insight:  Appropriate  Engagement in Group:  Engaged  Modes of Intervention:  Education  Summary of Progress/Problems: Attended AA group.  Orvan Falconer 08/28/2022, 8:13 PM

## 2022-08-28 NOTE — BHH Group Notes (Addendum)
Adult Psychoeducational Group Note  Date:  08/28/2022 Time:  11:12 AM  Group Topic/Focus:  Goals Group:   The focus of this group is to help patients establish daily goals to achieve during treatment and discuss how the patient can incorporate goal setting into their daily lives to aide in recovery.  Participation Level:  Did Not Attend  Additional Comments:  Pt did not attend group after being encouraged to go.   Ulice Brilliant Kattie Santoyo 08/28/2022, 11:12 AM

## 2022-08-28 NOTE — Progress Notes (Signed)
D:  Patient's self inventory sheet, patient has poor sleep, sleep medication not helpful.  Fair appetite, low energy level, good concentration.  Rated depression 5, hopeless 3, anxiety 10.  Denied withdrawals.  Denied SI.  Denied physical problems.  Denied physical pain.   A.  Medications administered per MD orders.  Emotional support and encouragement given patient. R:  Denied SI and HI, contracts for safety.  Denied A/V hallucinations.  Safety maintained with 15 minute checks.

## 2022-08-28 NOTE — Plan of Care (Signed)
  Problem: Education: Goal: Emotional status will improve Outcome: Progressing   Problem: Safety: Goal: Periods of time without injury will increase Outcome: Progressing   Problem: Education: Goal: Utilization of techniques to improve thought processes will improve Outcome: Progressing   Problem: Activity: Goal: Imbalance in normal sleep/wake cycle will improve Outcome: Progressing   Problem: Coping: Goal: Will verbalize feelings Outcome: Progressing   Problem: Safety: Goal: Ability to disclose and discuss suicidal ideas will improve Outcome: Progressing   Problem: Self-Concept: Goal: Will verbalize positive feelings about self Outcome: Progressing

## 2022-08-29 DIAGNOSIS — F332 Major depressive disorder, recurrent severe without psychotic features: Secondary | ICD-10-CM | POA: Diagnosis not present

## 2022-08-29 MED ORDER — POTASSIUM CHLORIDE CRYS ER 20 MEQ PO TBCR
20.0000 meq | EXTENDED_RELEASE_TABLET | Freq: Two times a day (BID) | ORAL | Status: AC
  Administered 2022-08-29 – 2022-08-31 (×4): 20 meq via ORAL
  Filled 2022-08-29 (×6): qty 1

## 2022-08-29 NOTE — BHH Group Notes (Signed)
[place] Group Notes:  (Nursing/MHT/Case Management/Adjunct)  Date:  08/29/2022  Time:  9:07 AM  Type of Therapy:  Group Therapy  Participation Level:  Minimal  Participation Quality:  Appropriate  Affect:  Appropriate  Cognitive:  Appropriate  Insight:  Good  Engagement in Group:  None  Modes of Intervention:  Discussion  Summary of Progress/Problems: Stated her goal is to make it through the day  Chase Picket 08/29/2022, 9:07 AM

## 2022-08-29 NOTE — BHH Group Notes (Signed)
[place] Group Notes:  (Nursing)  Date:  08/29/2022  Time:  400P  Type of Therapy:  Psychoeducational Skills  Participation Level:  Active  Participation Quality:  Appropriate and Attentive  Affect:  Appropriate  Cognitive:  Alert and Appropriate  Insight:  Appropriate  Engagement in Group:  Engaged  Modes of Intervention:  Discussion, Exploration, Rapport Building, and Socialization  Summary of Progress/Problems:  Gina Hendricks 08/29/2022, 6:35 PM

## 2022-08-29 NOTE — Progress Notes (Signed)
   08/29/22 0547  15 Minute Checks  Location Bedroom  Visual Appearance Calm  Behavior Sleeping  Sleep (Behavioral Health Patients Only)  Calculate sleep? (Click Yes once per 24 hr at 0600 safety check) Yes  Documented sleep last 24 hours 8

## 2022-08-29 NOTE — Progress Notes (Signed)
   08/29/22 2205  Psych Admission Type (Psych Patients Only)  Admission Status Voluntary  Psychosocial Assessment  Patient Complaints Depression  Eye Contact Fair  Facial Expression Anxious  Affect Appropriate to circumstance  Speech Logical/coherent  Interaction Minimal  Motor Activity Other (Comment) (WDL)  Appearance/Hygiene Unremarkable  Behavior Characteristics Appropriate to situation  Mood Depressed  Thought Process  Coherency WDL  Content WDL  Delusions None reported or observed  Perception WDL  Hallucination None reported or observed  Judgment WDL  Confusion None  Danger to Self  Current suicidal ideation? Denies  Agreement Not to Harm Self Yes  Description of Agreement verbal  Danger to Others  Danger to Others None reported or observed

## 2022-08-29 NOTE — BHH Group Notes (Signed)
[place] Group Notes:  (Nursing/MHT/Case Management/Adjunct)  Date:  08/29/2022  Time:  8:49 PM  Type of Therapy:  Group Therapy  Participation Level:  Active  Participation Quality:  Appropriate  Affect:  Appropriate  Cognitive:  Appropriate  Insight:  Appropriate  Engagement in Group:  Engaged  Modes of Intervention:  Education  Summary of Progress/Problems: Goal to get thru the day. Day 3/10.  Orvan Falconer 08/29/2022, 8:49 PM

## 2022-08-29 NOTE — Progress Notes (Signed)
   08/28/22 2200  Psych Admission Type (Psych Patients Only)  Admission Status Voluntary  Psychosocial Assessment  Patient Complaints Anxiety;Depression  Eye Contact Fair  Facial Expression Anxious  Affect Blunted  Speech Soft  Interaction Forwards little  Motor Activity Fidgety  Appearance/Hygiene Unremarkable  Behavior Characteristics Guarded  Mood Depressed;Anxious  Thought Process  Coherency WDL  Content WDL  Delusions WDL  Perception WDL  Hallucination None reported or observed  Judgment WDL  Confusion None  Danger to Self  Current suicidal ideation? Denies  Agreement Not to Harm Self Yes  Description of Agreement Verbal Contract  Danger to Others  Danger to Others None reported or observed

## 2022-08-29 NOTE — Progress Notes (Signed)
[facility] MD Progress Note  08/29/2022 11:00 AM Gina Hendricks  MRN:  [id number]  Principal Problem: MDD (major depressive disorder) Diagnosis: Principal Problem:   MDD (major depressive disorder)   Reason for Admission:  Gina Hendricks is a 62 yo female with PPHX of MDD, alcohol use disorder in sustained remission, and benzodiazepine use disorder presenting voluntarily to [facility] for worsening depression and suicidal ideation. Admitted on 08/26/2022.  (admitted on 08/26/2022, total  LOS: 3 days )  24 hour review: Staff reported that the patient was doing fairly well.  She has slept better after the trazodone and hydroxyzine was given last night.  She denied any side effects and has been compliant with treatment.  Staff reports that she is attending groups.  She is contracting for safety.  Information Obtained Today During Patient Interview:  The patient was seen and evaluated today.  She is alert oriented and cooperative but remains depressed and has significant blunted affect.  She has significant psychomotor retardation.  Her speech is of low volume with some latency but no obvious looseness of associations or flight of ideas.  Today she rates her depression at 5/10 and anxiety at a 5/10.  She is contracting for safety and denies suicidal ideations.  She appears to be cognitively intact.  She does present as somewhat skeptical about her response to treatment given her past experience of not having had consistent improvement of her depression on antidepressants.  However she did make some concrete goals for discharge.   Past Psychiatric Hx: Current Psychiatrist: Denies Current Therapist: Denies Previous Psych Diagnoses:  MDD, recurrent, severe, with psychosis Anxiety disorder, unspecified Benzodiazepine use disorder Current psychiatric medications: Denies Psychiatric medication history: Effexor-reports this is the most recent medication change she took.  Reports no improvement in  symptoms Paxil Zoloft Prozac Wellbutrin Prior inpatient treatment: [facility] hospitalization 08/18/2020-08/27/2020 "The patient reported having suicidal ideation with a plan to overdose on her recently deceased mother's medications.  The patient was not taking any medications on admission having weaned herself off of Xanax with the last dose reportedly on July 12, 2020. " Medications at discharge include: Escitalopram 10 mg, hydroxyzine 25 mg, quetiapine 25 mg Current/prior outpatient treatment: History of suicide: as described in the HPI History of homicide or aggression: Denies Past Medical History:  Past Medical History:  Diagnosis Date   Anxiety    Anxiety disorder, unspecified 08/23/2020   Depression    Heart murmur    Family History:  Family History  Problem Relation Age of Onset   Arthritis Mother    Vision loss Mother    Heart disease Father    Alcohol abuse Brother    Depression Brother    Heart disease Maternal Grandmother    Alcohol abuse Maternal Grandfather    Heart disease Paternal Grandfather    Alcohol abuse Brother    Family History: Reports alcohol use on mother side.  Denies any history of suicide in her family.   Social History: Living: Lives alone in [place] Work: Unemployed, receiving SSA Marital Status: Single Children: Denies Abuse: None except as detailed in HPI Legal: Denies Nature conservation officer: Denies  Current Medications: Current Facility-Administered Medications  Medication Dose Route Frequency Provider Last Rate Last Admin   acetaminophen (TYLENOL) tablet 650 mg  650 mg Oral Q6H PRN Derrill Center, NP       alum & mag hydroxide-simeth (MAALOX/MYLANTA) 200-200-20 MG/5ML suspension 30 mL  30 mL Oral Q4H PRN Derrill Center, NP  DULoxetine (CYMBALTA) DR capsule 30 mg  30 mg Oral Daily Carrion-Carrero, Margely, MD   30 mg at 08/29/22 0827   hydrOXYzine (ATARAX) tablet 10 mg  10 mg Oral TID PRN Derrill Center, NP   10 mg at 08/28/22 2120    LORazepam (ATIVAN) tablet 2 mg  2 mg Oral TID PRN Derrill Center, NP       Or   LORazepam (ATIVAN) injection 2 mg  2 mg Intramuscular TID PRN Derrill Center, NP       magnesium hydroxide (MILK OF MAGNESIA) suspension 30 mL  30 mL Oral Daily PRN Derrill Center, NP       melatonin tablet 3 mg  3 mg Oral QHS PRN Carrion-Carrero, Margely, MD       nicotine (NICODERM CQ - dosed in mg/24 hours) patch 21 mg  21 mg Transdermal Daily Carrion-Carrero, Margely, MD   21 mg at 08/29/22 0829   traZODone (DESYREL) tablet 50 mg  50 mg Oral QHS PRN Christene Slates, MD   50 mg at 08/28/22 2120    Lab Results: No results found for this or any previous visit (from the past 48 hour(s)).  Blood Alcohol level:  Lab Results  Component Value Date   ETH <10 08/25/2022   ETH <10 [id number]    Metabolic Labs: Lab Results  Component Value Date   HGBA1C 5.5 08/21/2020   MPG 111.15 08/21/2020   Lab Results  Component Value Date   PROLACTIN 5.2 08/21/2020   Lab Results  Component Value Date   CHOL 157 08/21/2020   TRIG 54 08/21/2020   HDL 44 08/21/2020   CHOLHDL 3.6 08/21/2020   VLDL 11 08/21/2020   LDLCALC 102 (H) 08/21/2020   LDLCALC 177 (H) 11/30/2019    Sleep:Sleep: Good Number of Hours of Sleep: 4   Physical Findings: AIMS: No  CIWA:    COWS:     Psychiatric Specialty Exam:  Presentation  General Appearance: Appropriate for Environment  Eye Contact:Fair  Speech:Clear and Coherent  Speech Volume:Decreased  Handedness:Right   Mood and Affect  Mood:Anxious; Depressed  Affect:Blunt; Constricted   Thought Process  Thought Processes:Linear  Descriptions of Associations:Intact  Orientation:Full (Time, Place and Person)  Thought Content:Logical  History of Schizophrenia/Schizoaffective disorder:No  Duration of Psychotic Symptoms:No data recorded Hallucinations:Hallucinations: None  Ideas of Reference:None  Suicidal Thoughts:Suicidal Thoughts:  No  Homicidal Thoughts:Homicidal Thoughts: No   Sensorium  Memory:Immediate Fair; Remote Fair; Recent Fair  Judgment:Poor  Insight:Lacking   Executive Functions  Concentration:Fair  Attention Span:Fair  [place]   Psychomotor Activity  Psychomotor Activity:Psychomotor Activity: Decreased   Assets  Assets:Communication Skills; Desire for Improvement; Physical Health; Housing   Sleep  Sleep:Sleep: Good Number of Hours of Sleep: 4    Physical Exam: Physical Exam Vitals and nursing note reviewed.  Constitutional:      General: She is not in acute distress.    Appearance: She is not ill-appearing.  HENT:     Head: Normocephalic and atraumatic.  Pulmonary:     Effort: Pulmonary effort is normal. No respiratory distress.  Musculoskeletal:        General: Normal range of motion.    Review of Systems  Respiratory:  Negative for shortness of breath.   Cardiovascular:  Negative for chest pain.  Gastrointestinal:  Negative for abdominal pain, constipation and diarrhea.  Psychiatric/Behavioral:  Negative for depression, hallucinations, memory loss, substance abuse and suicidal ideas. The patient is nervous/anxious.  The patient does not have insomnia.    Blood pressure (!) 91/58, pulse 73, temperature 98.1 F (36.7 C), temperature source Oral, resp. rate 16, height 4\' 11"  (1.499 m), weight 49.6 kg, SpO2 100 %. Body mass index is 22.1 kg/m.  Treatment Plan Summary: Daily contact with patient to assess and evaluate symptoms and progress in treatment and Medication management   ASSESSMENT: Gina Hendricks is a 62 yo female with PPHX of MDD, alcohol use disorder in sustained remission, and benzodiazepine use disorder presenting voluntarily to [facility] for worsening depression and suicidal ideation. Admitted on 08/26/2022.     Diagnoses / Active Problems: Dysthymia Alcohol use disorder, in sustained remission Benzodiazepine  use disorder, in sustained remission Cannabis use disorder, in sustained remission   PLAN: Safety and Monitoring:             -- VOLUNTARY admission to inpatient psychiatric unit for safety, stabilization and treatment             -- Daily contact with patient to assess and evaluate symptoms and progress in treatment             -- Patient's case to be discussed in multi-disciplinary team meeting             -- Observation Level : q15 minute checks             -- Vital signs:  q12 hours             -- Precautions: suicide, elopement, and assault   2. Psychiatric Diagnoses and Treatment:  Start Cymbalta 30 mg daily for depression and anxiety will consider increasing it to 60 mg by Monday. As needed: Trazodone 50 mg for insomnia, melatonin 3 mg -- The risks/benefits/side-effects/alternatives to this medication were discussed in detail with the patient and time was given for questions. The patient consents to medication trial.              -- Encouraged patient to participate in unit milieu and in scheduled group therapies              -- Short Term Goals: Ability to identify changes in lifestyle to reduce recurrence of condition will improve and Ability to verbalize feelings will improve             -- Long Term Goals: Improvement in symptoms so as ready for discharge                3. Medical Issues Being Addressed:              Tobacco Use Disorder             -- Nicotine patch 21mg /24 hours ordered             -- Smoking cessation encouraged   4. Discharge Planning:              -- Social work and case management to assist with discharge planning and identification of hospital follow-up needs prior to discharge             -- Estimated LOS: Most likely by next Thursday.             -- Discharge Concerns: Need to establish a safety plan; Medication compliance and effectiveness             -- Discharge Goals: Return home with outpatient referrals for mental health follow-up including  medication management/psychotherapy    I certify that inpatient services furnished  can reasonably be expected to improve the patient's condition.    Dr. Jacques Navy, MD PGY-1, Psychiatry Residency  3/30/202411:00 AM Patient ID: Gina Hendricks, female   DOB: 02-11-1961, 62 y.o.   MRN: [id number]

## 2022-08-30 MED ORDER — QUETIAPINE FUMARATE 50 MG PO TABS
50.0000 mg | ORAL_TABLET | Freq: Every day | ORAL | Status: DC
  Administered 2022-08-30 – 2022-09-03 (×5): 50 mg via ORAL
  Filled 2022-08-30 (×6): qty 1

## 2022-08-30 MED ORDER — DULOXETINE HCL 60 MG PO CPEP
60.0000 mg | ORAL_CAPSULE | Freq: Every day | ORAL | Status: DC
  Administered 2022-08-31 – 2022-09-01 (×2): 60 mg via ORAL
  Filled 2022-08-30 (×4): qty 1

## 2022-08-30 NOTE — Progress Notes (Signed)
[facility] MD Progress Note  08/30/2022 12:45 PM [name]  MRN:  [id number]  Principal Problem: MDD (major depressive disorder) Diagnosis: Principal Problem:   MDD (major depressive disorder)   Reason for Admission:  Gina Hendricks is a 62 yo female with PPHX of MDD, alcohol use disorder in sustained remission, and benzodiazepine use disorder presenting voluntarily to [facility] for worsening depression and suicidal ideation. Admitted on 08/26/2022.  (admitted on 08/26/2022, total  LOS: 4 days )  Yesterday, the psychiatry team made following recommendations:  Continue Cymbalta 30 mg or depression and anxiety As needed: Trazodone 50 mg for insomnia, melatonin 3 mg  Information Obtained Today During Patient Interview:  Patient evaluated on the unit.  Reports she continues to experience symptoms of depression and anxiety.  Reports her depression has improved, rates it a 5/10 today.  Describes her mood today as "feeling stuck".  Regarding her goal of returning to her normal self, she continues to feel like she is not there yet.  She also reports that her anxiety presently is 5/10, 10 being the worst.  Reports both her sleep and appetite are fair.  Reports her sleep could be better, amenable to changing sleep medications to Seroquel 50, reports she has responded to this in the past.  Patient denies any suicidal ideation.  Denies any homicidal ideation.  No auditory or visual hallucinations.  No paranoid ideations.  No delusional thought processes including thought insertion, thought withdrawal, and ideas of reference.  Patient denies any side effects to currently prescribed psychiatric medications.  Denies any somatic complaints.   Doesn't want K+, anxious, tachycardic.   Past Psychiatric Hx: Current Psychiatrist: Denies Current Therapist: Denies Previous Psych Diagnoses:  MDD, recurrent, severe, with psychosis Anxiety disorder, unspecified Benzodiazepine use disorder Current psychiatric  medications: Denies Psychiatric medication history: Effexor-reports this is the most recent medication change she took.  Reports no improvement in symptoms Paxil Zoloft Prozac Wellbutrin Prior inpatient treatment: [facility] hospitalization 08/18/2020-08/27/2020 "The patient reported having suicidal ideation with a plan to overdose on her recently deceased mother's medications.  The patient was not taking any medications on admission having weaned herself off of Xanax with the last dose reportedly on July 12, 2020. " Medications at discharge include: Escitalopram 10 mg, hydroxyzine 25 mg, quetiapine 25 mg Current/prior outpatient treatment: History of suicide: as described in the HPI History of homicide or aggression: Denies Past Medical History:  Past Medical History:  Diagnosis Date   Anxiety    Anxiety disorder, unspecified 08/23/2020   Depression    Heart murmur    Family History:  Family History  Problem Relation Age of Onset   Arthritis Mother    Vision loss Mother    Heart disease Father    Alcohol abuse Brother    Depression Brother    Heart disease Maternal Grandmother    Alcohol abuse Maternal Grandfather    Heart disease Paternal Grandfather    Alcohol abuse Brother    Family History: Reports alcohol use on mother side.  Denies any history of suicide in her family.   Social History: Living: Lives alone in [place] Work: Unemployed, receiving SSA Marital Status: Single Children: Denies Abuse: None except as detailed in HPI Legal: Denies Nature conservation officer: Denies  Current Medications: Current Facility-Administered Medications  Medication Dose Route Frequency Provider Last Rate Last Admin   acetaminophen (TYLENOL) tablet 650 mg  650 mg Oral Q6H PRN Derrill Center, NP       alum & mag hydroxide-simeth (MAALOX/MYLANTA)  200-200-20 MG/5ML suspension 30 mL  30 mL Oral Q4H PRN Derrill Center, NP       [START ON 08/31/2022] DULoxetine (CYMBALTA) DR capsule 60 mg  60 mg Oral  Daily Carrion-Carrero, Zaylie Gisler, MD       hydrOXYzine (ATARAX) tablet 10 mg  10 mg Oral TID PRN Derrill Center, NP   10 mg at 08/30/22 0736   LORazepam (ATIVAN) tablet 2 mg  2 mg Oral TID PRN Derrill Center, NP       Or   LORazepam (ATIVAN) injection 2 mg  2 mg Intramuscular TID PRN Derrill Center, NP       magnesium hydroxide (MILK OF MAGNESIA) suspension 30 mL  30 mL Oral Daily PRN Derrill Center, NP       melatonin tablet 3 mg  3 mg Oral QHS PRN Carrion-Carrero, Alhassan Everingham, MD       nicotine (NICODERM CQ - dosed in mg/24 hours) patch 21 mg  21 mg Transdermal Daily Carrion-Carrero, Govanni Plemons, MD   21 mg at 08/30/22 [id number]   potassium chloride SA (KLOR-CON M) CR tablet 20 mEq  20 mEq Oral BID Ranae Palms, MD   20 mEq at 08/30/22 0735   QUEtiapine (SEROQUEL) tablet 50 mg  50 mg Oral QHS Carrion-Carrero, Yanuel Tagg, MD        Lab Results: No results found for this or any previous visit (from the past 48 hour(s)).  Blood Alcohol level:  Lab Results  Component Value Date   [facility] <10 08/25/2022   ETH <10 [id number]    Metabolic Labs: Lab Results  Component Value Date   HGBA1C 5.5 08/21/2020   MPG 111.15 08/21/2020   Lab Results  Component Value Date   PROLACTIN 5.2 08/21/2020   Lab Results  Component Value Date   CHOL 157 08/21/2020   TRIG 54 08/21/2020   HDL 44 08/21/2020   CHOLHDL 3.6 08/21/2020   VLDL 11 08/21/2020   LDLCALC 102 (H) 08/21/2020   LDLCALC 177 (H) 11/30/2019    Sleep:Sleep: Good   Physical Findings: AIMS: No  CIWA:    COWS:     Psychiatric Specialty Exam:  Presentation  General Appearance: Appropriate for Environment; Casual; Fairly Groomed  Eye Contact:Fair  Speech:Clear and Coherent; Normal Rate  Speech Volume:Normal  Handedness:Right   Mood and Affect  Mood:Depressed; Anxious; Hopeless  Affect:Full Range; Congruent; Depressed   Thought Process  Thought Processes:Coherent; Goal Directed; Linear  Descriptions of  Associations:Intact  Orientation:Full (Time, Place and Person)  Thought Content:Logical; WDL  History of Schizophrenia/Schizoaffective disorder:No  Duration of Psychotic Symptoms:No data recorded Hallucinations:Hallucinations: None  Ideas of Reference:None  Suicidal Thoughts:Suicidal Thoughts: No  Homicidal Thoughts:Homicidal Thoughts: No   Sensorium  Memory:Immediate Fair  Judgment:Poor  Insight:Lacking; Poor   Executive Functions  Concentration:Fair  Attention Span:Fair  [place]   Psychomotor Activity  Psychomotor Activity:Psychomotor Activity: Normal   Assets  Assets:Communication Skills; Desire for Improvement; Resilience   Sleep  Sleep:Sleep: Good    Physical Exam: Physical Exam Vitals and nursing note reviewed.  Constitutional:      General: She is not in acute distress.    Appearance: She is not ill-appearing.  HENT:     Head: Normocephalic and atraumatic.  Pulmonary:     Effort: Pulmonary effort is normal. No respiratory distress.  Musculoskeletal:        General: Swelling present. Normal range of motion.     Comments: +2 bilateral pitting edema.  Review of Systems  Respiratory:  Negative for shortness of breath.   Cardiovascular:  Negative for chest pain.  Gastrointestinal:  Negative for abdominal pain, constipation and diarrhea.  Psychiatric/Behavioral:  Positive for depression. Negative for hallucinations, memory loss, substance abuse and suicidal ideas. The patient is nervous/anxious. The patient does not have insomnia.    Blood pressure (!) 92/54, pulse (!) 107, temperature 98 F (36.7 C), temperature source Oral, resp. rate 16, height 4\' 11"  (1.499 m), weight 49.6 kg, SpO2 97 %. Body mass index is 22.1 kg/m.  Treatment Plan Summary: Daily contact with patient to assess and evaluate symptoms and progress in treatment and Medication management   ASSESSMENT: Verner Tewari. Saggio is a 62  yo female with PPHX of MDD, alcohol use disorder in sustained remission, and benzodiazepine use disorder presenting voluntarily to [facility] for worsening depression and suicidal ideation. Admitted on 08/26/2022.     Diagnoses / Active Problems: Dysthymia Alcohol use disorder, in sustained remission Benzodiazepine use disorder, in sustained remission Cannabis use disorder, in sustained remission   PLAN: Safety and Monitoring:             -- VOLUNTARY admission to inpatient psychiatric unit for safety, stabilization and treatment             -- Daily contact with patient to assess and evaluate symptoms and progress in treatment             -- Patient's case to be discussed in multi-disciplinary team meeting             -- Observation Level : q15 minute checks             -- Vital signs:  q12 hours             -- Precautions: suicide, elopement, and assault   2. Psychiatric Diagnoses and Treatment:  Increase Cymbalta from 30 mg to 60 mg daily for depression and anxiety Discontinue trazodone Start Seroquel 50 mg nightly for insomnia -- The risks/benefits/side-effects/alternatives to this medication were discussed in detail with the patient and time was given for questions. The patient consents to medication trial.              -- Encouraged patient to participate in unit milieu and in scheduled group therapies              -- Short Term Goals: Ability to identify changes in lifestyle to reduce recurrence of condition will improve and Ability to verbalize feelings will improve             -- Long Term Goals: Improvement in symptoms so as ready for discharge                3. Medical Issues Being Addressed:              Tobacco Use Disorder             -- Nicotine patch 21mg /24 hours ordered             -- Smoking cessation encouraged Hypokalemia K+ 3.3 on 08/25/2022 Given KCL 40 mEq  Repeat BMP  4. Discharge Planning:              -- Social work and case management to assist with discharge  planning and identification of hospital follow-up needs prior to discharge             -- Estimated LOS: 5-7 days             --  Discharge Concerns: Need to establish a safety plan; Medication compliance and effectiveness             -- Discharge Goals: Return home with outpatient referrals for mental health follow-up including medication management/psychotherapy    I certify that inpatient services furnished can reasonably be expected to improve the patient's condition.    Dr. Jacques Navy, MD PGY-1, Psychiatry Residency  3/31/202412:45 PM

## 2022-08-30 NOTE — Progress Notes (Signed)
D:  Patient's self inventory sheet, patient has poor sleep, received sleep medication.  Fair appetite, low energy level, good concentration.  Rated depression and anxiety 5, hopeless 2.  Denied withdrawals.  Denied SI.  Denied physical problems.  Goal is get through the day.  Plans to discharge to "where I stay".   A:  Medications administered per MD orders, except for her potassium medication.   Emotional support and encouragement given patient. R:  Denied SI and HI, contracts for safety.  Denied A/V hallucinations.  Safety maintained with 15 minute checks. Patient slept 5 hours last night. Patient plans to talk to MD about potassium medication.

## 2022-08-30 NOTE — Progress Notes (Signed)
   08/30/22 2145  Psych Admission Type (Psych Patients Only)  Admission Status Voluntary  Psychosocial Assessment  Patient Complaints Anxiety  Eye Contact Fair  Facial Expression Flat  Affect Appropriate to circumstance  Speech Logical/coherent  Interaction Minimal  Motor Activity Other (Comment) (WDL)  Appearance/Hygiene Unremarkable  Behavior Characteristics Appropriate to situation  Mood Pleasant;Anxious  Thought Process  Coherency WDL  Content WDL  Delusions None reported or observed  Perception WDL  Hallucination None reported or observed  Judgment Poor  Confusion None  Danger to Self  Current suicidal ideation? Denies  Agreement Not to Harm Self Yes  Description of Agreement verbal  Danger to Others  Danger to Others None reported or observed

## 2022-08-30 NOTE — Plan of Care (Signed)
Nurse discussed anxiety, depression and coping skills with patient.  

## 2022-08-30 NOTE — Group Note (Signed)
Date:  08/30/2022 Time:  5:15 PM  Group Topic/Focus:  Spirituality:   The focus of this group is to discuss how one's spirituality can aide in recovery.    Participation Level:  Active  Participation Quality:  Appropriate  Affect:  Appropriate  Cognitive:  Appropriate  Insight: Appropriate  Engagement in Group:  Engaged  Modes of Intervention:  Exploration  Additional Comments:     Jerrye Beavers 08/30/2022, 5:15 PM

## 2022-08-30 NOTE — Group Note (Signed)
Date:  08/30/2022 Time:  9:36 AM  Group Topic/Focus:  Orientation:   The focus of this group is to educate the patient on the purpose and policies of crisis stabilization and provide a format to answer questions about their admission.  The group details unit policies and expectations of patients while admitted.    Participation Level:  Minimal  Participation Quality:  Attentive  Affect:  Appropriate  Cognitive:  Appropriate  Insight: Appropriate  Engagement in Group:  Lacking  Modes of Intervention:  Discussion  Additional Comments:     Jerrye Beavers 08/30/2022, 9:36 AM

## 2022-08-30 NOTE — Progress Notes (Signed)
Adult Psychoeducational Group Note  Date:  08/30/2022 Time:  8:55 PM  Group Topic/Focus:  Wrap-Up Group:   The focus of this group is to help patients review their daily goal of treatment and discuss progress on daily workbooks.  Participation Level:  Minimal  Participation Quality:  Appropriate  Affect:  Appropriate  Cognitive:  Appropriate  Insight: Improving  Engagement in Group:  Engaged  Modes of Intervention:  Education  Additional Comments:  Pt attended the evening wrap-up group. Tech introduced the staff for the evening, reminded group of the evening schedule and reminded them to ask for anything they need. PT introduced themself to the group and shared a coping skill they use or have learned.  Amie Critchley 08/30/2022, 8:55 PM

## 2022-08-30 NOTE — Group Note (Signed)
Date:  08/30/2022 Time:  10:06 AM  Group Topic/Focus:  Developing a Wellness Toolbox:   The focus of this group is to help patients develop a "wellness toolbox" with skills and strategies to promote recovery upon discharge.    Participation Level:  Active  Participation Quality:  Appropriate  Affect:  Appropriate  Cognitive:  Appropriate  Insight: Appropriate  Engagement in Group:  Engaged  Modes of Intervention:  Exploration  Additional Comments:     Gina Hendricks 08/30/2022, 10:06 AM

## 2022-08-31 LAB — BASIC METABOLIC PANEL
Anion gap: 5 (ref 5–15)
BUN: 15 mg/dL (ref 6–20)
CO2: 25 mmol/L (ref 22–32)
Calcium: 8.7 mg/dL — ABNORMAL LOW (ref 8.9–10.3)
Chloride: 108 mmol/L (ref 98–111)
Creatinine, Ser: 0.71 mg/dL (ref 0.44–1.00)
GFR, Estimated: >60 mL/min (ref >60)
Glucose, Bld: 98 mg/dL (ref 70–99)
Potassium: 4.2 mmol/L (ref 3.5–5.1)
Sodium: 138 mmol/L (ref 135–145)

## 2022-08-31 NOTE — Group Note (Signed)
Date:  08/31/2022 Time:  2:05 PM  Group Topic/Focus:  Wellness Toolbox:   The focus of this group is to discuss various aspects of wellness, balancing those aspects and exploring ways to increase the ability to experience wellness.  Patients will create a wellness toolbox for use upon discharge.    Participation Level:  Active  Participation Quality:  Appropriate  Affect:  Appropriate  Cognitive:  Appropriate  Insight: Appropriate  Engagement in Group:  Engaged  Modes of Intervention:      Additional Comments:     Jerrye Beavers 08/31/2022, 2:05 PM

## 2022-08-31 NOTE — [facility] Group Notes (Signed)
PsychoEducational Group Note. Patients were given poem by Cristopher Peru, titled '' There's a hole in my sidewalk'' as it describes repeating negative behavioral patterns. Patients were then asked to reflect on negative patterns in their own life they would like to change, using reflection and positive reframing.  Pt attended but minimally participated.

## 2022-08-31 NOTE — Group Note (Signed)
Date:  08/31/2022 Time:  9:30 AM  Group Topic/Focus:  Orientation:   The focus of this group is to educate the patient on the purpose and policies of crisis stabilization and provide a format to answer questions about their admission.  The group details unit policies and expectations of patients while admitted.    Participation Level:  Active  Participation Quality:  Appropriate  Affect:  Appropriate  Cognitive:  Appropriate  Insight: Good  Engagement in Group:  Engaged  Modes of Intervention:  Discussion  Additional Comments:     Jerrye Beavers 08/31/2022, 9:30 AM

## 2022-08-31 NOTE — Progress Notes (Signed)
  Delight Ovens, Chaplain  Othelia Pulling, Chaplain Spiritual care group on grief and loss facilitated by [employer], Bcc and Chaplain Verley Pariseau   Group Goal: Support / Education around grief and loss   Members engage in facilitated group support and psycho-social education.   Group Description:   Following introductions and group rules, group members engaged in facilitated group dialogue and support around topic of loss, with particular support around experiences of loss in their lives. Group Identified types of loss (relationships / self / things) and identified patterns, circumstances, and changes that precipitate losses. Reflected on thoughts / feelings around loss, normalized grief responses, and recognized variety in grief experience. Group encouraged individual reflection on safe space and on the coping skills that they are already utilizing.   Group drew on Adlerian / Rogerian and narrative framework   Patient Progress: Patient attended group but was not engaged in discussions or handouts.      Zissel Biederman [employer]

## 2022-08-31 NOTE — Group Note (Signed)
Occupational Therapy Group Note  Group Topic:Coping Skills  Group Date: 08/31/2022 Start Time: 1430 End Time: 1505 Facilitators: Brantley Stage, OT   Group Description: Group encouraged increased engagement and participation through discussion and activity focused on "Coping Ahead." Patients were split up into teams and selected a card from a stack of positive coping strategies. Patients were instructed to act out/charade the coping skill for other peers to guess and receive points for their team. Discussion followed with a focus on identifying additional positive coping strategies and patients shared how they were going to cope ahead over the weekend while continuing hospitalization stay.  Therapeutic Goal(s): Identify positive vs negative coping strategies. Identify coping skills to be used during hospitalization vs coping skills outside of hospital/at home Increase participation in therapeutic group environment and promote engagement in treatment   Participation Level: Engaged   Participation Quality: Independent   Behavior: Appropriate   Speech/Thought Process: Relevant   Affect/Mood: Appropriate   Insight: Fair   Judgement: Fair   Individualization: pt was engaged in their participation of group discussion/activity. New skills identified  Modes of Intervention: Education  Patient Response to Interventions:  Attentive   Plan: Continue to engage patient in OT groups 2 - 3x/week.  08/31/2022  Brantley Stage, OT Cornell Barman, OT

## 2022-08-31 NOTE — Group Note (Signed)
Recreation Therapy Group Note   Group Topic:Health and Wellness  Group Date: 08/31/2022 Start Time: 0930 End Time: 1000 Facilitators: Maryan Sivak-McCall, LRT,CTRS Location: [address] Dayroom   Goal Area(s) Addresses:  Patient will verbalize benefit of exercise during group session. Patient will identify an exercise that can be completed post d/c. Patient will acknowledge benefits of exercise when used as a coping mechanism.   Group Description:  Exercise.  LRT introduced the activity of exercise patients.  LRT explained to patients they would be leading the activity by choosing the exercises that were completed by the group.  LRT and group completed three rounds of exercises.  Patients were instructed to take breaks if needed and listen to their bodies so as to not do any exercises that are outside of their ability.   Affect/Mood: Appropriate   Participation Level: Engaged   Participation Quality: Independent   Behavior: Appropriate   Speech/Thought Process: Focused   Insight: Good   Judgement: Good   Modes of Intervention: Music   Patient Response to Interventions:  Engaged   Education Outcome:  Acknowledges education and In group clarification offered    Clinical Observations/Individualized Feedback: Pt attended and participated in group session.    Plan: Continue to engage patient in RT group sessions 2-3x/week.   Ronesha Heenan-McCall, LRT,CTRS 08/31/2022 12:32 PM

## 2022-08-31 NOTE — Progress Notes (Signed)
Pt presents with depressed mood, affect blunted. Talyn continues to report that she feels depressed, and '' I'm just about the same. I did sleep better but other than that nothing has changed. ''  Patient appears very tearful when speaking about her mood and states '' I do come to the groups otherwise being in the room with my mind would be worse. '' Pt is cooperative on the unit. Compliant with am medications. Pt given self inventory form but did not return back to staff. Pt is eating and drinking and able to make her needs known. Pt is safe, will con't to monitor.

## 2022-08-31 NOTE — Plan of Care (Signed)
  Problem: Education: Goal: Knowledge of [facility] Education information/materials will improve Outcome: Progressing Goal: Emotional status will improve Outcome: Progressing Goal: Mental status will improve Outcome: Progressing Goal: Verbalization of understanding the information provided will improve Outcome: Progressing   Problem: Activity: Goal: Interest or engagement in activities will improve Outcome: Progressing Goal: Sleeping patterns will improve Outcome: Progressing   Problem: Coping: Goal: Ability to verbalize frustrations and anger appropriately will improve Outcome: Progressing Goal: Ability to demonstrate self-control will improve Outcome: Progressing   Problem: Health Behavior/Discharge Planning: Goal: Identification of resources available to assist in meeting health care needs will improve Outcome: Progressing Goal: Compliance with treatment plan for underlying cause of condition will improve Outcome: Progressing   

## 2022-08-31 NOTE — Plan of Care (Signed)
  Problem: Education: Goal: Emotional status will improve Outcome: Progressing Goal: Mental status will improve Outcome: Progressing   Problem: Activity: Goal: Interest or engagement in activities will improve Outcome: Progressing   Problem: Coping: Goal: Ability to demonstrate self-control will improve Outcome: Progressing   Problem: Activity: Goal: Interest or engagement in leisure activities will improve Outcome: Progressing   Problem: Coping: Goal: Coping ability will improve Outcome: Progressing   Problem: Medication: Goal: Compliance with prescribed medication regimen will improve Outcome: Progressing

## 2022-08-31 NOTE — Progress Notes (Signed)
[facility] MD Progress Note  08/31/2022 10:11 AM Gina Hendricks  MRN:  [id number]  Principal Problem: MDD (major depressive disorder) Diagnosis: Principal Problem:   MDD (major depressive disorder)   Reason for Admission:  Gina Hendricks is a 62 yo female with PPHX of MDD, alcohol use disorder in sustained remission, and benzodiazepine use disorder presenting voluntarily to [facility] for worsening depression and suicidal ideation. Admitted on 08/26/2022.  (admitted on 08/26/2022, total  LOS: 5 days )  Yesterday, the psychiatry team made following recommendations:  Increase Cymbalta 60 mg or depression and anxiety As needed: Trazodone 50 mg for insomnia, melatonin 3 mg  Chart Review Attended 2/2 groups, participated. Didn't want K supplementation. Reported anxiety. No PRNs  Information Obtained Today During Patient Interview:  Patient evaluated on the unit.  Patient seen in interview room. Reports she is doing "alright". Answers that she is alright to most questions. Reports her depression is a 5/10 and anxiety is a 5/10. Reports it was 10/10 on admission. Reports her sleep is better. Reports that she makes herself eat.   Patient denies any suicidal ideation.  Denies any homicidal ideation.  No auditory or visual hallucinations.  No paranoid ideations.  No delusional thought processes including thought insertion, thought withdrawal, and ideas of reference.  Patient denies any side effects to currently prescribed psychiatric medications.  Denies any somatic complaints.  In bed progression, nurse noted patient still appears depressed. Reports she used to work in [place] before and has difficulty adjusting to life without that. Reports sister in [place].   Past Psychiatric Hx: Current Psychiatrist: Denies Current Therapist: Denies Previous Psych Diagnoses:  MDD, recurrent, severe, with psychosis Anxiety disorder, unspecified Benzodiazepine use disorder Current psychiatric medications: Denies Psychiatric  medication history: Effexor-reports this is the most recent medication change she took.  Reports no improvement in symptoms Paxil Zoloft Prozac Wellbutrin Prior inpatient treatment: [facility] hospitalization 08/18/2020-08/27/2020 "The patient reported having suicidal ideation with a plan to overdose on her recently deceased mother's medications.  The patient was not taking any medications on admission having weaned herself off of Xanax with the last dose reportedly on July 12, 2020. " Medications at discharge include: Escitalopram 10 mg, hydroxyzine 25 mg, quetiapine 25 mg Current/prior outpatient treatment: History of suicide: as described in the HPI History of homicide or aggression: Denies Past Medical History:  Past Medical History:  Diagnosis Date   Anxiety    Anxiety disorder, unspecified 08/23/2020   Depression    Heart murmur    Family History:  Family History  Problem Relation Age of Onset   Arthritis Mother    Vision loss Mother    Heart disease Father    Alcohol abuse Brother    Depression Brother    Heart disease Maternal Grandmother    Alcohol abuse Maternal Grandfather    Heart disease Paternal Grandfather    Alcohol abuse Brother    Family History: Reports alcohol use on mother side.  Denies any history of suicide in her family.   Social History: Living: Lives alone in [place] Work: Unemployed, receiving SSA Marital Status: Single Children: Denies Abuse: None except as detailed in HPI Legal: Denies Nature conservation officer: Denies  Current Medications: Current Facility-Administered Medications  Medication Dose Route Frequency Provider Last Rate Last Admin   acetaminophen (TYLENOL) tablet 650 mg  650 mg Oral Q6H PRN Derrill Center, NP       alum & mag hydroxide-simeth (MAALOX/MYLANTA) 200-200-20 MG/5ML suspension 30 mL  30 mL Oral Q4H PRN  Derrill Center, NP       DULoxetine (CYMBALTA) DR capsule 60 mg  60 mg Oral Daily Carrion-Carrero, Caryl Ada, MD   60 mg at 08/31/22  0735   hydrOXYzine (ATARAX) tablet 10 mg  10 mg Oral TID PRN Derrill Center, NP   10 mg at 08/30/22 0736   LORazepam (ATIVAN) tablet 2 mg  2 mg Oral TID PRN Derrill Center, NP       Or   LORazepam (ATIVAN) injection 2 mg  2 mg Intramuscular TID PRN Derrill Center, NP       magnesium hydroxide (MILK OF MAGNESIA) suspension 30 mL  30 mL Oral Daily PRN Derrill Center, NP       melatonin tablet 3 mg  3 mg Oral QHS PRN Carrion-Carrero, Margely, MD       nicotine (NICODERM CQ - dosed in mg/24 hours) patch 21 mg  21 mg Transdermal Daily Carrion-Carrero, Caryl Ada, MD   21 mg at 08/31/22 0735   QUEtiapine (SEROQUEL) tablet 50 mg  50 mg Oral QHS Carrion-Carrero, Caryl Ada, MD   50 mg at 08/30/22 2126    Lab Results:  Results for orders placed or performed during the hospital encounter of 08/26/22 (from the past 48 hour(s))  Basic metabolic panel     Status: Abnormal   Collection Time: 08/31/22  6:29 AM  Result Value Ref Range   Sodium 138 135 - 145 mmol/L   Potassium 4.2 3.5 - 5.1 mmol/L   Chloride 108 98 - 111 mmol/L   CO2 25 22 - 32 mmol/L   Glucose, Bld 98 70 - 99 mg/dL    Comment: Glucose reference range applies only to samples taken after fasting for at least 8 hours.   BUN 15 6 - 20 mg/dL   Creatinine, Ser 0.71 0.44 - 1.00 mg/dL   Calcium 8.7 (L) 8.9 - 10.3 mg/dL   GFR, Estimated >60 >60 mL/min    Comment: (NOTE) Calculated using the CKD-EPI Creatinine Equation (2021)    Anion gap 5 5 - 15    Comment: Performed at [facility], [place] [address], [place], [place] [address]    Blood Alcohol level:  Lab Results  Component Value Date   [facility] <10 08/25/2022   ETH <10 [id number]    Metabolic Labs: Lab Results  Component Value Date   HGBA1C 5.5 08/21/2020   MPG 111.15 08/21/2020   Lab Results  Component Value Date   PROLACTIN 5.2 08/21/2020   Lab Results  Component Value Date   CHOL 157 08/21/2020   TRIG 54 08/21/2020   HDL 44 08/21/2020   CHOLHDL 3.6  08/21/2020   VLDL 11 08/21/2020   LDLCALC 102 (H) 08/21/2020   LDLCALC 177 (H) 11/30/2019    Sleep:Sleep: Good   Physical Findings: AIMS: No   Psychiatric Specialty Exam:  Presentation  General Appearance: Appropriate for Environment; Casual; Fairly Groomed  Eye Contact:Fair  Speech:Clear and Coherent; Normal Rate  Speech Volume:Normal  Handedness:Right   Mood and Affect  Mood:Depressed; Anxious; Hopeless "Alright"   Affect:Full Range; Congruent; Depressed   Thought Process  Thought Processes:Coherent; Goal Directed; Linear  Descriptions of Associations:Intact  Orientation:Full (Time, Place and Person)  Thought Content:Logical; WDL  History of Schizophrenia/Schizoaffective disorder:No  Duration of Psychotic Symptoms: None Hallucinations:Hallucinations: None  Ideas of Reference:None  Suicidal Thoughts:Suicidal Thoughts: No  Homicidal Thoughts:Homicidal Thoughts: No   Sensorium  Memory:Immediate Fair  Judgment:Poor  Insight:Lacking; Poor   Executive Functions  Concentration:Fair  Attention Span:Fair  [place]   Psychomotor Activity  Psychomotor Activity:Psychomotor Activity: Normal   Assets  Assets:Communication Skills; Desire for Improvement; Resilience   Sleep  Sleep: Better  Physical Exam Constitutional:      Appearance: the patient is not toxic-appearing.  Pulmonary:     Effort: Pulmonary effort is normal.  Neurological:     General: No focal deficit present.     Mental Status: the patient is alert and oriented to person, place, and time.   Review of Systems  Respiratory:  Negative for shortness of breath.   Cardiovascular:  Negative for chest pain.  Gastrointestinal:  Negative for abdominal pain, constipation, diarrhea, nausea and vomiting.  Neurological:  Negative for headaches.   Blood pressure 104/67, pulse 80, temperature 98 F (36.7 C), temperature source Oral, resp.  rate 16, height 4\' 11"  (1.499 m), weight 49.6 kg, SpO2 100 %. Body mass index is 22.1 kg/m.  Treatment Plan Summary: Daily contact with patient to assess and evaluate symptoms and progress in treatment and Medication management   ASSESSMENT: Jerome Huskins. Crossno is a 62 yo female with PPHX of MDD, alcohol use disorder in sustained remission, and benzodiazepine use disorder presenting voluntarily to [facility] for worsening depression and suicidal ideation. Admitted on 08/26/2022.   Diagnoses / Active Problems: Dysthymia Alcohol use disorder, in sustained remission Benzodiazepine use disorder, in sustained remission Cannabis use disorder, in sustained remission   PLAN: Safety and Monitoring:             -- VOLUNTARY admission to inpatient psychiatric unit for safety, stabilization and treatment             -- Daily contact with patient to assess and evaluate symptoms and progress in treatment             -- Patient's case to be discussed in multi-disciplinary team meeting             -- Observation Level : q15 minute checks             -- Vital signs:  q12 hours             -- Precautions: suicide, elopement, and assault   2. Psychiatric Diagnoses and Treatment:  Continue Cymbalta DR 60 mg daily for depression and anxiety Discontinue trazodone Continue Seroquel 50 mg nightly for insomnia -- The risks/benefits/side-effects/alternatives to this medication were discussed in detail with the patient and time was given for questions. The patient consents to medication trial.              -- Encouraged patient to participate in unit milieu and in scheduled group therapies              -- Short Term Goals: Ability to identify changes in lifestyle to reduce recurrence of condition will improve and Ability to verbalize feelings will improve             -- Long Term Goals: Improvement in symptoms so as ready for discharge                3. Medical Issues Being Addressed:              Tobacco Use  Disorder             -- Nicotine patch 21mg /24 hours ordered             -- Smoking cessation encouraged  Hypokalemia, repleted   4. Discharge Planning:              --  Social work and case management to assist with discharge planning and identification of hospital follow-up needs prior to discharge  -- Discussed finding day program close to home for activities              -- Estimated LOS: 5-7 days, date of d/c 4/4             -- Discharge Concerns: Need to establish a safety plan; Medication compliance and effectiveness             -- Discharge Goals: Return home with outpatient referrals for mental health follow-up including medication management/psychotherapy   I certify that inpatient services furnished can reasonably be expected to improve the patient's condition.    Dr. Rolanda Lundborg, MD PGY-1, Psychiatry Residency  4/1/202410:11 AM

## 2022-08-31 NOTE — [facility] Group Notes (Signed)
Patient attended the AA group. 

## 2022-08-31 NOTE — Group Note (Signed)
Date:  08/31/2022 Time:  2:02 PM  Group Topic/Focus:  Goals Group:   The focus of this group is to help patients establish daily goals to achieve during treatment and discuss how the patient can incorporate goal setting into their daily lives to aide in recovery.    Participation Level:  Active  Participation Quality:  Appropriate  Affect:  Appropriate  Cognitive:  Appropriate  Insight: Appropriate  Engagement in Group:  Engaged  Modes of Intervention:  Exploration  Additional Comments:     Jerrye Beavers 08/31/2022, 2:02 PM

## 2022-08-31 NOTE — Group Note (Signed)
Date:  08/31/2022 Time:  5:00 PM  Group Topic/Focus:  Spirituality:   The focus of this group is to discuss how one's spirituality can aide in recovery.    Participation Level:  Active  Participation Quality:  Appropriate  Affect:  Appropriate  Cognitive:  Appropriate  Insight: Appropriate  Engagement in Group:  Engaged  Modes of Intervention:  Exploration   Additional Comments:     Jerrye Beavers 08/31/2022, 5:00 PM

## 2022-08-31 NOTE — Progress Notes (Signed)
    08/31/22 2116  Psych Admission Type (Psych Patients Only)  Admission Status Voluntary  Psychosocial Assessment  Patient Complaints Anxiety  Eye Contact Fair  Facial Expression Anxious  Affect Blunted  Speech Soft;Logical/coherent  Interaction Cautious;Forwards little  Motor Activity Fidgety  Appearance/Hygiene In scrubs  Behavior Characteristics Anxious;Fidgety;Guarded  Mood Depressed;Anxious  Thought Process  Coherency WDL  Content WDL  Delusions None reported or observed  Perception WDL  Hallucination None reported or observed  Judgment WDL  Confusion None  Danger to Self  Current suicidal ideation? Denies  Agreement Not to Harm Self Yes  Description of Agreement verbal  Danger to Others  Danger to Others None reported or observed

## 2022-09-01 LAB — LIPID PANEL
Cholesterol: 164 mg/dL (ref 0–200)
HDL: 46 mg/dL (ref >40)
LDL Cholesterol: 106 mg/dL — ABNORMAL HIGH (ref 0–99)
Total CHOL/HDL Ratio: 3.6 RATIO
Triglycerides: 62 mg/dL (ref <150)
VLDL: 12 mg/dL (ref 0–40)

## 2022-09-01 MED ORDER — DULOXETINE HCL 30 MG PO CPEP
90.0000 mg | ORAL_CAPSULE | Freq: Every day | ORAL | Status: DC
  Administered 2022-09-02 – 2022-09-04 (×3): 90 mg via ORAL
  Filled 2022-09-01 (×5): qty 3

## 2022-09-01 NOTE — Group Note (Signed)
Recreation Therapy Group Note   Group Topic:Animal Assisted Therapy   Group Date: 09/01/2022 Start Time: 0945 End Time: 1030 Facilitators: Houa Ackert-McCall, LRT,CTRS Location: [address] Dayroom   Animal-Assisted Activity (AAA) Program Checklist/Progress Notes Patient Eligibility Criteria Checklist & Daily Group note for Rec Tx Intervention  AAA/T Program Assumption of Risk Form signed by Patient/ or Parent Legal Guardian Yes  Patient is free of allergies or severe asthma Yes  Patient reports no fear of animals Yes  Patient reports no history of cruelty to animals Yes  Patient understands his/her participation is voluntary Yes  Patient washes hands before animal contact Yes  Patient washes hands after animal contact Yes    Affect/Mood: Flat   Participation Level: Minimal   Participation Quality: Independent   Behavior: Appropriate   Speech/Thought Process: Focused    Clinical Observations/Individualized Feedback: Pt was quiet and attentive during group session.  Pt had minimal interaction with therapy dog.    Plan: Continue to engage patient in RT group sessions 2-3x/week.   Lin Hackmann-McCall, LRT,CTRS 09/01/2022 12:18 PM

## 2022-09-01 NOTE — Plan of Care (Signed)
  Problem: Health Behavior/Discharge Planning: Goal: Identification of resources available to assist in meeting health care needs will improve Outcome: Progressing Goal: Compliance with treatment plan for underlying cause of condition will improve Outcome: Progressing   Problem: Coping: Goal: Ability to verbalize frustrations and anger appropriately will improve Outcome: Progressing Goal: Ability to demonstrate self-control will improve Outcome: Progressing   Problem: Activity: Goal: Interest or engagement in activities will improve Outcome: Progressing Goal: Sleeping patterns will improve Outcome: Progressing   Problem: Education: Goal: Knowledge of [facility] Education information/materials will improve Outcome: Progressing Goal: Emotional status will improve Outcome: Progressing Goal: Mental status will improve Outcome: Progressing Goal: Verbalization of understanding the information provided will improve Outcome: Progressing

## 2022-09-01 NOTE — Progress Notes (Signed)
Administered PRN Hydroxyzine per MAR per patient request. 

## 2022-09-01 NOTE — B[facility] Group Notes (Signed)
[facility] Group Notes:  (Nursing/MHT/Case Management/Adjunct)  Date:  09/01/2022  Time:  11:36 AM  Type of Therapy:  Group Therapy  Participation Level:  Active  Participation Quality:  Appropriate  Affect:  Appropriate  Cognitive:  Appropriate  Insight:  Good  Engagement in Group:  Engaged  Modes of Intervention:  Discussion  Summary of Progress/Problems:  Gina Hendricks 09/01/2022, 11:36 AM

## 2022-09-01 NOTE — Progress Notes (Signed)
Pt presents with depressed mood, affect blunted. Gina Hendricks continues to report that she feels depressed, and '' I'm just about the same. I did sleep better but other than that nothing has changed. I'm still in the hole. ''  Patient appears very tearful when speaking about her mood and states '' I don't know what else to do to make me better. I wish you could fix me. I wish you had a magic wand to make me better. '' Emotional support given, allowed pt to ventilate and encouragement provided. She has been visible on the unit attending programming and compliant with medications. Eating and drinking well and able to make her needs known. Pt denies any SI HI or AV Hallucinations but continues to voice tremendous '' depression I can't escape. ''  Above discussed with treatment team. Pt has not completed self inventory or turned into staff. Pt is safe, will con't to monitor.

## 2022-09-01 NOTE — Plan of Care (Signed)
  Problem: Education: Goal: Knowledge of [facility] Education information/materials will improve 09/01/2022 1856 by Ocie Doyne, RN Outcome: Progressing 09/01/2022 1454 by Ocie Doyne, RN Outcome: Progressing Goal: Emotional status will improve 09/01/2022 1856 by Ocie Doyne, RN Outcome: Progressing 09/01/2022 1454 by Ocie Doyne, RN Outcome: Progressing Goal: Mental status will improve 09/01/2022 1856 by Ocie Doyne, RN Outcome: Progressing 09/01/2022 1454 by Ocie Doyne, RN Outcome: Progressing Goal: Verbalization of understanding the information provided will improve 09/01/2022 1856 by Ocie Doyne, RN Outcome: Progressing 09/01/2022 1454 by Ocie Doyne, RN Outcome: Progressing   Problem: Activity: Goal: Interest or engagement in activities will improve 09/01/2022 1856 by Ocie Doyne, RN Outcome: Progressing 09/01/2022 1454 by Ocie Doyne, RN Outcome: Progressing Goal: Sleeping patterns will improve 09/01/2022 1856 by Ocie Doyne, RN Outcome: Progressing 09/01/2022 1454 by Ocie Doyne, RN Outcome: Progressing   Problem: Coping: Goal: Ability to verbalize frustrations and anger appropriately will improve 09/01/2022 1856 by Ocie Doyne, RN Outcome: Progressing 09/01/2022 1454 by Ocie Doyne, RN Outcome: Progressing Goal: Ability to demonstrate self-control will improve 09/01/2022 1856 by Ocie Doyne, RN Outcome: Progressing 09/01/2022 1454 by Ocie Doyne, RN Outcome: Progressing

## 2022-09-01 NOTE — Progress Notes (Signed)
Adult Psychoeducational Group Note  Date:  09/01/2022 Time:  8:15 PM  Group Topic/Focus:  Wrap-Up Group:   The focus of this group is to help patients review their daily goal of treatment and discuss progress on daily workbooks.  Participation Level:  Active  Participation Quality:  Appropriate  Affect:  Appropriate  Cognitive:  Appropriate  Insight: Appropriate  Engagement in Group:  Engaged  Modes of Intervention:  Discussion  Additional Comments:  Gina Hendricks attend wrap up group. She said her day was a 5. The color  to describe her day was blue  Barbette Hair 09/01/2022, 8:15 PM

## 2022-09-01 NOTE — Progress Notes (Signed)
[facility] MD Progress Note  09/01/2022 6:43 AM Gina Hendricks  MRN:  [id number]  Principal Problem: MDD (major depressive disorder) Diagnosis: Principal Problem:   MDD (major depressive disorder)   Reason for Admission:  Gina Hendricks is a 62 yo female with PPHX of MDD, alcohol use disorder in sustained remission, and benzodiazepine use disorder presenting voluntarily to [facility] for worsening depression and suicidal ideation. Admitted on 08/26/2022.  (admitted on 08/26/2022, total  LOS: 6 days )  Yesterday, the psychiatry team made following recommendations:  Continue Cymbalta 60 mg or depression and anxiety Continue Seroquel 50mg  nightly for insomnia As needed: Trazodone 50 mg for insomnia, melatonin 3 mg  Chart Review Attended all groups, participation ranged from minimal to appropriate. Reported anxiety, received PRN hydroxyzine.  Information Obtained Today During Patient Interview:  Patient evaluated on the unit. Patient reports depression, still not sure why she is feeling this way. Reports it's been going on for 2 years. Reports that being in the hospital is the 7th time she's left the house. Reports depression and anxiety are around the same as yesterday. She reports that God brings her hope, reports it is getting her through every day here. Reports her sleep is good, reports breakfast is "nasty" but still eats. Reports willingness to connect with therapist.   Patient denies any suicidal ideation.  Denies any homicidal ideation.  No auditory or visual hallucinations.  No paranoid ideations.  No delusional thought processes including thought insertion, thought withdrawal, and ideas of reference.  Patient denies any side effects to currently prescribed psychiatric medications.  Denies any somatic complaints.  Per chart review, patient prior medication trials include effexor, paxil, zoloft, prozac, wellbutrin.   Past Psychiatric Hx: Current Psychiatrist: Denies Current Therapist:  Denies Previous Psych Diagnoses:  MDD, recurrent, severe, with psychosis Anxiety disorder, unspecified Benzodiazepine use disorder Current psychiatric medications: Denies Psychiatric medication history: Effexor-reports this is the most recent medication change she took.  Reports no improvement in symptoms Paxil Zoloft Prozac Wellbutrin Prior inpatient treatment: [facility] hospitalization 08/18/2020-08/27/2020 "The patient reported having suicidal ideation with a plan to overdose on her recently deceased mother's medications.  The patient was not taking any medications on admission having weaned herself off of Xanax with the last dose reportedly on July 12, 2020. " Medications at discharge include: Escitalopram 10 mg, hydroxyzine 25 mg, quetiapine 25 mg Current/prior outpatient treatment: History of suicide: as described in the HPI History of homicide or aggression: Denies Past Medical History:  Past Medical History:  Diagnosis Date   Anxiety    Anxiety disorder, unspecified 08/23/2020   Depression    Heart murmur    Family History:  Family History  Problem Relation Age of Onset   Arthritis Mother    Vision loss Mother    Heart disease Father    Alcohol abuse Brother    Depression Brother    Heart disease Maternal Grandmother    Alcohol abuse Maternal Grandfather    Heart disease Paternal Grandfather    Alcohol abuse Brother    Family History: Reports alcohol use on mother side.  Denies any history of suicide in her family.   Social History: Living: Lives alone in [place] Work: Unemployed, receiving SSA Marital Status: Single Children: Denies Abuse: None except as detailed in HPI Legal: Denies Nature conservation officer: Denies  Current Medications: Current Facility-Administered Medications  Medication Dose Route Frequency Provider Last Rate Last Admin   acetaminophen (TYLENOL) tablet 650 mg  650 mg Oral Q6H PRN Derrill Center,  NP       alum & mag hydroxide-simeth (MAALOX/MYLANTA)  200-200-20 MG/5ML suspension 30 mL  30 mL Oral Q4H PRN Derrill Center, NP       DULoxetine (CYMBALTA) DR capsule 60 mg  60 mg Oral Daily Carrion-Carrero, Margely, MD   60 mg at 08/31/22 0735   hydrOXYzine (ATARAX) tablet 10 mg  10 mg Oral TID PRN Derrill Center, NP   10 mg at 08/31/22 2116   LORazepam (ATIVAN) tablet 2 mg  2 mg Oral TID PRN Derrill Center, NP       Or   LORazepam (ATIVAN) injection 2 mg  2 mg Intramuscular TID PRN Derrill Center, NP       magnesium hydroxide (MILK OF MAGNESIA) suspension 30 mL  30 mL Oral Daily PRN Derrill Center, NP       melatonin tablet 3 mg  3 mg Oral QHS PRN Carrion-Carrero, Margely, MD       nicotine (NICODERM CQ - dosed in mg/24 hours) patch 21 mg  21 mg Transdermal Daily Carrion-Carrero, Caryl Ada, MD   21 mg at 08/31/22 0735   QUEtiapine (SEROQUEL) tablet 50 mg  50 mg Oral QHS Carrion-Carrero, Caryl Ada, MD   50 mg at 08/31/22 2116    Lab Results:  Results for orders placed or performed during the hospital encounter of 08/26/22 (from the past 48 hour(s))  Basic metabolic panel     Status: Abnormal   Collection Time: 08/31/22  6:29 AM  Result Value Ref Range   Sodium 138 135 - 145 mmol/L   Potassium 4.2 3.5 - 5.1 mmol/L   Chloride 108 98 - 111 mmol/L   CO2 25 22 - 32 mmol/L   Glucose, Bld 98 70 - 99 mg/dL    Comment: Glucose reference range applies only to samples taken after fasting for at least 8 hours.   BUN 15 6 - 20 mg/dL   Creatinine, Ser 0.71 0.44 - 1.00 mg/dL   Calcium 8.7 (L) 8.9 - 10.3 mg/dL   GFR, Estimated >60 >60 mL/min    Comment: (NOTE) Calculated using the CKD-EPI Creatinine Equation (2021)    Anion gap 5 5 - 15    Comment: Performed at [facility], [place] [address], [place], [place] [address]    Blood Alcohol level:  Lab Results  Component Value Date   [facility] <10 08/25/2022   ETH <10 [id number]    Metabolic Labs: Lab Results  Component Value Date   HGBA1C 5.5 08/21/2020   MPG 111.15 08/21/2020    Lab Results  Component Value Date   PROLACTIN 5.2 08/21/2020   Lab Results  Component Value Date   CHOL 157 08/21/2020   TRIG 54 08/21/2020   HDL 44 08/21/2020   CHOLHDL 3.6 08/21/2020   VLDL 11 08/21/2020   LDLCALC 102 (H) 08/21/2020   LDLCALC 177 (H) 11/30/2019    Sleep:No data recorded   Physical Findings: AIMS: No   Psychiatric Specialty Exam:  Presentation  General Appearance: Appropriate for Environment; Casual; Fairly Groomed  Eye Contact:Fair  Speech:Clear and Coherent; Normal Rate  Speech Volume:Normal  Handedness:Right   Mood and Affect  Mood:Depressed; Anxious; Hopeless "Same"   Affect: Flat   Thought Process  Thought Processes:Coherent; Goal Directed; Linear  Descriptions of Associations:Intact  Orientation:Full (Time, Place and Person)  Thought Content:Logical; WDL  History of Schizophrenia/Schizoaffective disorder:No  Duration of Psychotic Symptoms: None Hallucinations: denies   Ideas of Reference:None  Suicidal Thoughts: denies  Homicidal Thoughts: denies  Sensorium  Memory:Immediate Fair  Judgment:Poor  Insight:Lacking; Poor   Executive Functions  Concentration:Fair  Attention Span:Fair  Aldine   Psychomotor Activity  Psychomotor Activity: None   Assets  Assets:Communication Skills; Desire for Improvement; Resilience   Sleep  Sleep: Better  Physical Exam Constitutional:      Appearance: the patient is not toxic-appearing.  Pulmonary:     Effort: Pulmonary effort is normal.  Neurological:     General: No focal deficit present.     Mental Status: the patient is alert and oriented to person, place, and time.   Review of Systems  Respiratory:  Negative for shortness of breath.   Cardiovascular:  Negative for chest pain.  Gastrointestinal:  Negative for abdominal pain, constipation, diarrhea, nausea and vomiting.  Neurological:  Negative for headaches.    Blood pressure 91/79, pulse 86, temperature 99 F (37.2 C), temperature source Oral, resp. rate 16, height 4\' 11"  (1.499 m), weight 49.6 kg, SpO2 100 %. Body mass index is 22.1 kg/m.  Treatment Plan Summary: Daily contact with patient to assess and evaluate symptoms and progress in treatment and Medication management   ASSESSMENT: Tajanay Showell. Key is a 62 yo female with PPHX of MDD, alcohol use disorder in sustained remission, and benzodiazepine use disorder presenting voluntarily to [facility] for worsening depression and suicidal ideation. Admitted on 08/26/2022. Reports cymbalta is helping, increased to 90mg .    Diagnoses / Active Problems: Dysthymia Alcohol use disorder, in sustained remission Benzodiazepine use disorder, in sustained remission Cannabis use disorder, in sustained remission   PLAN: Safety and Monitoring:             -- VOLUNTARY admission to inpatient psychiatric unit for safety, stabilization and treatment             -- Daily contact with patient to assess and evaluate symptoms and progress in treatment             -- Patient's case to be discussed in multi-disciplinary team meeting             -- Observation Level : q15 minute checks             -- Vital signs:  q12 hours             -- Precautions: suicide, elopement, and assault   2. Psychiatric Diagnoses and Treatment:  INCREASE Cymbalta DR 90 mg daily for depression and anxiety Discontinue trazodone Continue Seroquel 50 mg nightly for insomnia Chaplain consulted, could consider [facility] outpatient   -- The risks/benefits/side-effects/alternatives to this medication were discussed in detail with the patient and time was given for questions. The patient consents to medication trial.              -- Encouraged patient to participate in unit milieu and in scheduled group therapies              -- Short Term Goals: Ability to identify changes in lifestyle to reduce recurrence of condition will improve and Ability to  verbalize feelings will improve             -- Long Term Goals: Improvement in symptoms so as ready for discharge   3. Medical Issues Being Addressed:              Tobacco Use Disorder             -- Nicotine patch 21mg /24 hours ordered             --  Smoking cessation encouraged  Hypokalemia, repleted   4. Discharge Planning:              -- Social work and case management to assist with discharge planning and identification of hospital follow-up needs prior to discharge  -- Discussed finding day program close to home for activities              -- Estimated LOS: 5-7 days, date of d/c 4/4             -- Discharge Concerns: Need to establish a safety plan; Medication compliance and effectiveness             -- Discharge Goals: Return home with outpatient referrals for mental health follow-up including medication management/psychotherapy   I certify that inpatient services furnished can reasonably be expected to improve the patient's condition.    Rolanda Lundborg, MD PGY-1, Psychiatry Residency  4/2/20246:43 AM

## 2022-09-01 NOTE — Progress Notes (Signed)
   09/01/22 2300  Psych Admission Type (Psych Patients Only)  Admission Status Voluntary  Psychosocial Assessment  Patient Complaints Anxiety  Eye Contact Fair  Facial Expression Flat;Anxious  Affect Blunted  Speech Logical/coherent;Soft  Interaction Assertive  Motor Activity Fidgety  Appearance/Hygiene In scrubs  Behavior Characteristics Cooperative;Anxious  Mood Anxious  Thought Process  Coherency WDL  Content WDL  Delusions WDL  Perception WDL  Hallucination None reported or observed  Judgment WDL  Confusion None  Danger to Self  Current suicidal ideation? Denies  Agreement Not to Harm Self Yes  Description of Agreement Verbal  Danger to Others  Danger to Others None reported or observed

## 2022-09-01 NOTE — Plan of Care (Signed)
Collateral Call  Patient's sister Theresa Duty was contacted for update at 3:30pm on 09/01/22 at [phone].  Patient's current diagnosis was explained as evidenced by specific signs/symptoms.  Assisted sister with understanding implications for treatment based on diagnosis and current level of behaviors/symptoms. Sister asked pertinent questions pertaining to understanding diagnosis and implications for treatment. She also asked about discharge planning and long-term housing options for patient. Discussed that social work would be better able to assist with discussing what options are available.   Rolanda Lundborg, MD, PGY-1

## 2022-09-01 NOTE — Progress Notes (Signed)
   09/01/22 0555  15 Minute Checks  Location Bedroom  Visual Appearance Calm  Behavior Sleeping  Sleep (Behavioral Health Patients Only)  Calculate sleep? (Click Yes once per 24 hr at 0600 safety check) Yes  Documented sleep last 24 hours 7.75

## 2022-09-02 ENCOUNTER — Encounter (HOSPITAL_COMMUNITY): Payer: Self-pay

## 2022-09-02 LAB — HEMOGLOBIN A1C
Hgb A1c MFr Bld: 5.9 % — ABNORMAL HIGH (ref 4.8–5.6)
Mean Plasma Glucose: 123 mg/dL

## 2022-09-02 NOTE — Progress Notes (Addendum)
[facility] MD Progress Note  09/02/2022 6:56 AM Gina Hendricks  MRN:  [id number]  Principal Problem: MDD (major depressive disorder) Diagnosis: Principal Problem:   MDD (major depressive disorder) Active Problems:   MDD (major depressive disorder), recurrent severe, without psychosis   Reason for Admission:  Gina Hendricks is a 62 yo female with PPHX of MDD, alcohol use disorder in sustained remission, and benzodiazepine use disorder presenting voluntarily to [facility] for worsening depression and suicidal ideation. Admitted on 08/26/2022.  (admitted on 08/26/2022, total  LOS: 7 days )  Yesterday, the psychiatry team made following recommendations:  INCREASE Cymbalta from 60 to 90mg  or depression and anxiety Continue Seroquel 50mg  nightly for insomnia As needed: Trazodone 50 mg for insomnia, melatonin 3 mg  Chart Review Attended all groups, participation ranged from minimal to appropriate. Reported anxiety, received PRN hydroxyzine.  Information Obtained Today During Patient Interview:  Patient evaluated on the unit. Seen in interview room, not completely facing interview. Somewhat limited eye contact. Continues to report she slept better, appetite ok. Lost sense of taste and emotions in 2021. Reports she talked with sister and it went fair. Discussed should discuss option of [place] outpatient with provider. Discussed daycare programming, reports "not too fond of it but something's got to change."   Patient denies any suicidal ideation.  Denies any homicidal ideation.  No auditory or visual hallucinations.  No paranoid ideations.  No delusional thought processes including thought insertion, thought withdrawal, and ideas of reference.  Patient denies any side effects to currently prescribed psychiatric medications.  Denies any somatic complaints.  Past Psychiatric Hx: Current Psychiatrist: Denies Current Therapist: Denies Previous Psych Diagnoses:  MDD, recurrent, severe, with psychosis Anxiety  disorder, unspecified Benzodiazepine use disorder Current psychiatric medications: Denies Psychiatric medication history: Effexor-reports this is the most recent medication change she took.  Reports no improvement in symptoms Paxil Zoloft Prozac Wellbutrin Prior inpatient treatment: [facility] hospitalization 08/18/2020-08/27/2020 "The patient reported having suicidal ideation with a plan to overdose on her recently deceased mother's medications.  The patient was not taking any medications on admission having weaned herself off of Xanax with the last dose reportedly on July 12, 2020. " Medications at discharge include: Escitalopram 10 mg, hydroxyzine 25 mg, quetiapine 25 mg Current/prior outpatient treatment: History of suicide: as described in the HPI History of homicide or aggression: Denies Past Medical History:  Past Medical History:  Diagnosis Date   Anxiety    Anxiety disorder, unspecified 08/23/2020   Depression    Heart murmur    Family History:  Family History  Problem Relation Age of Onset   Arthritis Mother    Vision loss Mother    Heart disease Father    Alcohol abuse Brother    Depression Brother    Heart disease Maternal Grandmother    Alcohol abuse Maternal Grandfather    Heart disease Paternal Grandfather    Alcohol abuse Brother    Family History: Reports alcohol use on mother side.  Denies any history of suicide in her family.   Social History: Living: Lives alone in [place] on mom's property. Can drive but doesn't. Reports it is quiet, isolated.  Work: Unemployed, receiving SSA Marital Status: Single Children: Denies Abuse: None except as detailed in HPI Legal: Denies Military: Denies  Current Medications: Current Facility-Administered Medications  Medication Dose Route Frequency Provider Last Rate Last Admin   acetaminophen (TYLENOL) tablet 650 mg  650 mg Oral Q6H PRN Derrill Center, NP  alum & mag hydroxide-simeth (MAALOX/MYLANTA)  200-200-20 MG/5ML suspension 30 mL  30 mL Oral Q4H PRN Derrill Center, NP       DULoxetine (CYMBALTA) DR capsule 90 mg  90 mg Oral Daily Rolanda Lundborg, MD       hydrOXYzine (ATARAX) tablet 10 mg  10 mg Oral TID PRN Derrill Center, NP   10 mg at 09/01/22 2133   LORazepam (ATIVAN) tablet 2 mg  2 mg Oral TID PRN Derrill Center, NP       Or   LORazepam (ATIVAN) injection 2 mg  2 mg Intramuscular TID PRN Derrill Center, NP       magnesium hydroxide (MILK OF MAGNESIA) suspension 30 mL  30 mL Oral Daily PRN Derrill Center, NP       melatonin tablet 3 mg  3 mg Oral QHS PRN Carrion-Carrero, Margely, MD       nicotine (NICODERM CQ - dosed in mg/24 hours) patch 21 mg  21 mg Transdermal Daily Carrion-Carrero, Caryl Ada, MD   21 mg at 09/01/22 0732   QUEtiapine (SEROQUEL) tablet 50 mg  50 mg Oral QHS Carrion-Carrero, Caryl Ada, MD   50 mg at 09/01/22 2132    Lab Results:  Results for orders placed or performed during the hospital encounter of 08/26/22 (from the past 48 hour(s))  Hemoglobin A1c     Status: Abnormal   Collection Time: 09/01/22  6:25 AM  Result Value Ref Range   Hgb A1c MFr Bld 5.9 (H) 4.8 - 5.6 %    Comment: (NOTE)         Prediabetes: 5.7 - 6.4         Diabetes: >6.4         Glycemic control for adults with diabetes: <7.0    Mean Plasma Glucose 123 mg/dL    Comment: (NOTE) Performed At: [facility] [place], [place] [id number] Rush Farmer MD [id number]   Lipid panel     Status: Abnormal   Collection Time: 09/01/22  6:25 AM  Result Value Ref Range   Cholesterol 164 0 - 200 mg/dL   Triglycerides 62 <150 mg/dL   HDL 46 >40 mg/dL   Total CHOL/HDL Ratio 3.6 RATIO   VLDL 12 0 - 40 mg/dL   LDL Cholesterol 106 (H) 0 - 99 mg/dL    Comment:        Total Cholesterol/HDL:CHD Risk Coronary Heart Disease Risk Table                     Men   Women  1/2 Average Risk   3.4   3.3  Average Risk       5.0   4.4  2 X Average Risk   9.6   7.1  3 X Average  Risk  23.4   11.0        Use the calculated Patient Ratio above and the CHD Risk Table to determine the patient's CHD Risk.        ATP III CLASSIFICATION (LDL):  <100     mg/dL   Optimal  100-129  mg/dL   Near or Above                    Optimal  130-159  mg/dL   Borderline  160-189  mg/dL   High  >190     mg/dL   Very High Performed at [facility] [address] [place],  [place] [address]     Blood Alcohol level:  Lab Results  Component Value Date   ETH <10 08/25/2022   ETH <10 [id number]    Metabolic Labs: Lab Results  Component Value Date   HGBA1C 5.9 (H) 09/01/2022   MPG 123 09/01/2022   MPG 111.15 08/21/2020   Lab Results  Component Value Date   PROLACTIN 5.2 08/21/2020   Lab Results  Component Value Date   CHOL 164 09/01/2022   TRIG 62 09/01/2022   HDL 46 09/01/2022   CHOLHDL 3.6 09/01/2022   VLDL 12 09/01/2022   LDLCALC 106 (H) 09/01/2022   LDLCALC 102 (H) 08/21/2020    Sleep: 7.75 hours    Physical Findings: AIMS: No  Psychiatric Specialty Exam:  Presentation  General Appearance: Appropriate for Environment; Casual; Fairly Groomed  Eye Contact:Fair  Speech:Clear and Coherent; Normal Rate  Speech Volume:Normal  Handedness:Right   Mood and Affect  Mood:Depressed; Anxious; Hopeless "OK, no clue, I wish I knew what was happening"   Affect: Flat   Thought Process  Thought Processes:Coherent; Goal Directed; Linear  Descriptions of Associations:Intact  Orientation:Full (Time, Place and Person)  Thought Content:Logical; WDL  History of Schizophrenia/Schizoaffective disorder:No  Duration of Psychotic Symptoms: None Hallucinations: denies   Ideas of Reference:None  Suicidal Thoughts: denies  Homicidal Thoughts: denies  Sensorium  Memory:Immediate Fair  Judgment: Improving   Insight: Improving    Executive Functions  Concentration:Fair  Attention Span:Fair  [place]   Psychomotor Activity  Psychomotor Activity: None   Assets  Assets:Communication Skills; Desire for Improvement; Resilience   Sleep  Sleep: Better  Physical Exam Constitutional:      Appearance: the patient is not toxic-appearing.  Pulmonary:     Effort: Pulmonary effort is normal.  Neurological:     General: No focal deficit present.     Mental Status: the patient is alert and oriented to person, place, and time.   Review of Systems  Respiratory:  Negative for shortness of breath.   Cardiovascular:  Negative for chest pain.  Gastrointestinal:  Negative for abdominal pain, constipation, diarrhea, nausea and vomiting.  Neurological:  Negative for headaches.   Blood pressure (!) 99/58, pulse 72, temperature 99 F (37.2 C), temperature source Oral, resp. rate 16, height 4\' 11"  (1.499 m), weight 49.6 kg, SpO2 100 %. Body mass index is 22.1 kg/m.  Treatment Plan Summary: Daily contact with patient to assess and evaluate symptoms and progress in treatment and Medication management   ASSESSMENT: Gina Hendricks is a 62 yo female with PPHX of MDD, alcohol use disorder in sustained remission, and benzodiazepine use disorder presenting voluntarily to [facility] for worsening depression and suicidal ideation. Admitted on 08/26/2022. Reports cymbalta is helping, increased to 90mg .    Diagnoses / Active Problems: Dysthymia Alcohol use disorder, in sustained remission Benzodiazepine use disorder, in sustained remission Cannabis use disorder, in sustained remission   PLAN: Safety and Monitoring:             -- VOLUNTARY admission to inpatient psychiatric unit for safety, stabilization and treatment             -- Daily contact with patient to assess and evaluate symptoms and progress in treatment             -- Patient's case to be discussed in multi-disciplinary team meeting             -- Observation Level : q15 minute checks             --  Vital signs:   q12 hours             -- Precautions: suicide, elopement, and assault   2. Psychiatric Diagnoses and Treatment:  Continue Cymbalta DR 90 mg daily for depression and anxiety Discontinue trazodone Continue Seroquel 50 mg nightly for insomnia Chaplain consulted, could consider [place] outpatient   -- The risks/benefits/side-effects/alternatives to this medication were discussed in detail with the patient and time was given for questions. The patient consents to medication trial.              -- Encouraged patient to participate in unit milieu and in scheduled group therapies              -- Short Term Goals: Ability to identify changes in lifestyle to reduce recurrence of condition will improve and Ability to verbalize feelings will improve             -- Long Term Goals: Improvement in symptoms so as ready for discharge   3. Medical Issues Being Addressed:              Tobacco Use Disorder             -- Nicotine patch 21mg /24 hours ordered             -- Smoking cessation encouraged  Hypokalemia, repleted   Metabolic monitoring on antipsychotic A1c 5.9 LDL 106  4. Discharge Planning:              -- Social work and case management to assist with discharge planning and identification of hospital follow-up needs prior to discharge  -- Discussed finding day program close to home for activities              -- Estimated LOS: 5-7 days, date of d/c 4/5 -- Discussed with sister, patient to follow-up with [facility] for day programming              -- Discharge Concerns: Need to establish a safety plan; Medication compliance and effectiveness             -- Discharge Goals: Return home with outpatient referrals for mental health follow-up including medication management/psychotherapy   I certify that inpatient services furnished can reasonably be expected to improve the patient's condition.    Rolanda Lundborg, MD PGY-1, Psychiatry Residency  4/3/20246:56 AM

## 2022-09-02 NOTE — Progress Notes (Signed)
   09/02/22 2200  Psych Admission Type (Psych Patients Only)  Admission Status Voluntary  Psychosocial Assessment  Patient Complaints Anxiety  Eye Contact Fair  Facial Expression Flat;Anxious  Affect Blunted  Speech Soft;Logical/coherent  Interaction Assertive  Motor Activity Fidgety  Appearance/Hygiene In scrubs  Behavior Characteristics Cooperative;Anxious  Mood Anxious;Depressed  Thought Process  Coherency WDL  Content WDL  Delusions None reported or observed  Perception WDL  Hallucination None reported or observed  Judgment WDL  Confusion None  Danger to Self  Current suicidal ideation? Denies  Agreement Not to Harm Self Yes  Description of Agreement verbal  Danger to Others  Danger to Others None reported or observed

## 2022-09-02 NOTE — Progress Notes (Signed)
   09/02/22 0600  15 Minute Checks  Location Bedroom  Visual Appearance Calm  Behavior Sleeping  Sleep (Behavioral Health Patients Only)  Calculate sleep? (Click Yes once per 24 hr at 0600 safety check) Yes  Documented sleep last 24 hours 7.75

## 2022-09-02 NOTE — BH IP Treatment Plan (Signed)
Interdisciplinary Treatment and Diagnostic Plan Update  09/02/2022 Time of Session: 8:40 AM ( UPDATE) Gina Hendricks MRN: [id number]  Principal Diagnosis: MDD (major depressive disorder)  Secondary Diagnoses: Principal Problem:   MDD (major depressive disorder) Active Problems:   MDD (major depressive disorder), recurrent severe, without psychosis   Current Medications:  Current Facility-Administered Medications  Medication Dose Route Frequency Provider Last Rate Last Admin   acetaminophen (TYLENOL) tablet 650 mg  650 mg Oral Q6H PRN Derrill Center, NP       alum & mag hydroxide-simeth (MAALOX/MYLANTA) 200-200-20 MG/5ML suspension 30 mL  30 mL Oral Q4H PRN Derrill Center, NP       DULoxetine (CYMBALTA) DR capsule 90 mg  90 mg Oral Daily Rolanda Lundborg, MD   90 mg at 09/02/22 [id number]   hydrOXYzine (ATARAX) tablet 10 mg  10 mg Oral TID PRN Derrill Center, NP   10 mg at 09/02/22 0814   LORazepam (ATIVAN) tablet 2 mg  2 mg Oral TID PRN Derrill Center, NP       Or   LORazepam (ATIVAN) injection 2 mg  2 mg Intramuscular TID PRN Derrill Center, NP       magnesium hydroxide (MILK OF MAGNESIA) suspension 30 mL  30 mL Oral Daily PRN Derrill Center, NP       melatonin tablet 3 mg  3 mg Oral QHS PRN Carrion-Carrero, Margely, MD       nicotine (NICODERM CQ - dosed in mg/24 hours) patch 21 mg  21 mg Transdermal Daily Carrion-Carrero, Margely, MD   21 mg at 09/02/22 0815   QUEtiapine (SEROQUEL) tablet 50 mg  50 mg Oral QHS Carrion-Carrero, Margely, MD   50 mg at 09/01/22 2132   PTA Medications: Medications Prior to Admission  Medication Sig Dispense Refill Last Dose   busPIRone (BUSPAR) 10 MG tablet Take 1 tablet (10 mg total) by mouth 3 (three) times daily. (Patient not taking: Reported on 08/25/2022) 180 tablet 0    DULoxetine (CYMBALTA) 60 MG capsule TAKE 2 CAPSULES BY MOUTH DAILY (Patient not taking: Reported on 08/25/2022) 180 capsule 2    ibuprofen (ADVIL) 200 MG tablet Take 200-400 mg by  mouth every 6 (six) hours as needed for moderate pain.      predniSONE (DELTASONE) 10 MG tablet Take 5 daily for 2 days followed by 4,3,2 and 1 for 2 days each. (Patient not taking: Reported on 08/25/2022) 30 tablet 0    QUEtiapine (SEROQUEL) 50 MG tablet TAKE 1 TABLET BY MOUTH EVERYDAY AT BEDTIME (Patient not taking: Reported on 08/25/2022) 90 tablet 0     Patient Stressors: Financial difficulties   Medication change or noncompliance    Patient Strengths: Average or above average intelligence  Capable of independent living  Communication skills   Treatment Modalities: Medication Management, Group therapy, Case management,  1 to 1 session with clinician, Psychoeducation, Recreational therapy.   Physician Treatment Plan for Primary Diagnosis: MDD (major depressive disorder) Long Term Goal(s): Improvement in symptoms so as ready for discharge   Short Term Goals: Ability to identify changes in lifestyle to reduce recurrence of condition will improve Ability to verbalize feelings will improve  Medication Management: Evaluate patient's response, side effects, and tolerance of medication regimen.  Therapeutic Interventions: 1 to 1 sessions, Unit Group sessions and Medication administration.  Evaluation of Outcomes: Progressing  Physician Treatment Plan for Secondary Diagnosis: Principal Problem:   MDD (major depressive disorder) Active Problems:   MDD (major depressive  disorder), recurrent severe, without psychosis  Long Term Goal(s): Improvement in symptoms so as ready for discharge   Short Term Goals: Ability to identify changes in lifestyle to reduce recurrence of condition will improve Ability to verbalize feelings will improve     Medication Management: Evaluate patient's response, side effects, and tolerance of medication regimen.  Therapeutic Interventions: 1 to 1 sessions, Unit Group sessions and Medication administration.  Evaluation of Outcomes: Progressing   RN  Treatment Plan for Primary Diagnosis: MDD (major depressive disorder) Long Term Goal(s): Knowledge of disease and therapeutic regimen to maintain health will improve  Short Term Goals: Ability to remain free from injury will improve, Ability to verbalize frustration and anger appropriately will improve, Ability to participate in decision making will improve, Ability to verbalize feelings will improve, Ability to identify and develop effective coping behaviors will improve, and Compliance with prescribed medications will improve  Medication Management: RN will administer medications as ordered by provider, will assess and evaluate patient's response and provide education to patient for prescribed medication. RN will report any adverse and/or side effects to prescribing provider.  Therapeutic Interventions: 1 on 1 counseling sessions, Psychoeducation, Medication administration, Evaluate responses to treatment, Monitor vital signs and CBGs as ordered, Perform/monitor CIWA, COWS, AIMS and Fall Risk screenings as ordered, Perform wound care treatments as ordered.  Evaluation of Outcomes: Progressing   LCSW Treatment Plan for Primary Diagnosis: MDD (major depressive disorder) Long Term Goal(s): Safe transition to appropriate next level of care at discharge, Engage patient in therapeutic group addressing interpersonal concerns.  Short Term Goals: Engage patient in aftercare planning with referrals and resources, Increase social support, Increase emotional regulation, Facilitate acceptance of mental health diagnosis and concerns, Identify triggers associated with mental health/substance abuse issues, and Increase skills for wellness and recovery  Therapeutic Interventions: Assess for all discharge needs, 1 to 1 time with Social worker, Explore available resources and support systems, Assess for adequacy in community support network, Educate family and significant other(s) on suicide prevention, Complete  Psychosocial Assessment, Interpersonal group therapy.  Evaluation of Outcomes: Progressing   Progress in Treatment: Attending groups: Yes. Participating in groups: Yes. Taking medication as prescribed: Yes. Toleration medication: Yes. Family/Significant other contact made: No, will contact:  Declined  Patient understands diagnosis: Yes. Discussing patient identified problems/goals with staff: Yes. Medical problems stabilized or resolved: Yes. Denies suicidal/homicidal ideation: Yes. Issues/concerns per patient self-inventory: No.   New problem(s) identified: No, Describe:  None reported   New Short Term/Long Term Goal(s):medication stabilization, elimination of SI thoughts, development of comprehensive mental wellness plan.     Patient Goals:  Medication Stabilization   Discharge Plan or Barriers: medication stabilization, elimination of SI thoughts, development of comprehensive mental wellness plan.    Reason for Continuation of Hospitalization: Anxiety Depression Medication stabilization Withdrawal symptoms   Estimated Length of Stay: 2-3 Days   Last 3 [place] Suicide Severity Risk Score: [place] Admission (Current) from 08/26/2022 in [facility] ED from 08/25/2022 in [facility] Emergency Department at [facility] ED from 08/05/2022 in [facility] Low Risk Moderate Risk Low Risk       Last PHQ 2/9 Scores:    03/16/2022   10:12 AM 01/12/2022    8:09 AM 12/03/2021   12:10 PM  Depression screen PHQ 2/9  Decreased Interest 3 3 2   Down, Depressed, Hopeless 3 3 3   PHQ - 2 Score 6 6 5   Altered sleeping  3 3   Tired, decreased energy 3 3   Change in appetite 3 3   Feeling bad or failure about yourself  3 3   Trouble concentrating 3 0   Moving slowly or fidgety/restless 3 3   Suicidal thoughts 1 0   PHQ-9 Score 25 21   Difficult doing work/chores Extremely dIfficult  Extremely dIfficult     Scribe for Treatment Team: Charlett Lango 09/02/2022 8:37 AM

## 2022-09-02 NOTE — Group Note (Signed)
Recreation Therapy Group Note   Group Topic:Other  Group Date: 09/02/2022 Start Time: 1400 End Time: 1440 Facilitators: Vartan Kerins-McCall, LRT,CTRS Location: 400 Hall Dayroom   Activity Description/Intervention: Therapeutic Drumming. Patients with peers and staff were given the opportunity to engage in a leader facilitated [place] with staff from the [employer], in partnership with [employer]. Nurse, adult and trained [employer], Devin Going leading with LRT observing and documenting intervention and pt response. This evidenced-based practice targets 7 areas of health and wellbeing in the human experience including: stress-reduction, exercise, self-expression, camaraderie/support, nurturing, spirituality, and music-making (leisure).   Goal Area(s) Addresses:  Patient will engage in pro-social way in music group.  Patient will follow directions of drum leader on the first prompt. Patient will identify if a reduction in stress level occurs as a result of participation in therapeutic drum circle.     Affect/Mood: Flat   Participation Level: None   Participation Quality: None   Behavior: Withdrawn   Speech/Thought Process: N/A   Insight: N/A   Judgement: N/A   Modes of Intervention: Nurse, adult   Patient Response to Interventions:  Resistant    Education Outcome:  In group clarification offered    Clinical Observations/Individualized Feedback: Pt appeared upset and did not participate in drumming circle.     Plan: Continue to engage patient in RT group sessions 2-3x/week.   Lupita Rosales-McCall, LRT,CTRS 09/02/2022 2:58 PM

## 2022-09-02 NOTE — Progress Notes (Signed)
   09/02/22 [id number]  Psych Admission Type (Psych Patients Only)  Admission Status Voluntary  Psychosocial Assessment  Patient Complaints Anxiety;Depression  Eye Contact Fair  Facial Expression Flat;Anxious  Affect Blunted;Depressed  Speech Soft;Logical/coherent  Interaction Assertive  Motor Activity Fidgety  Appearance/Hygiene In scrubs  Behavior Characteristics Cooperative;Anxious  Mood Anxious;Depressed  Thought Process  Coherency WDL  Content WDL  Delusions None reported or observed  Perception WDL  Hallucination None reported or observed  Judgment WDL  Confusion None  Danger to Self  Current suicidal ideation? Denies  Agreement Not to Harm Self Yes  Description of Agreement verbal  Danger to Others  Danger to Others None reported or observed   Blood pressures continue to be low this shift. Patient complained of swollen feet and this RN assessed bilateral pitting edema +1. Patient states this started on 08/29/22. Advised to elevate feet when in bed.

## 2022-09-02 NOTE — BHH Group Notes (Signed)
[facility] Group Notes:  (Nursing/MHT/Case Management/Adjunct)  Date:  09/02/2022  Time:  4:09 PM  Type of Therapy:  Group Therapy  Participation Level:  Active  Participation Quality:  Appropriate  Affect:  Appropriate  Cognitive:  Appropriate  Insight:  Good  Engagement in Group:  Engaged  Modes of Intervention:  Discussion  Summary of Progress/Problems:  Redmond Pulling 09/02/2022, 4:09 PM

## 2022-09-02 NOTE — BHH Group Notes (Signed)
[place] Group Notes:  (Nursing/MHT/Case Management/Adjunct)  Date:  09/02/2022  Time:  9:57 AM  Type of Therapy:  Group Therapy  Participation Level:  Active  Participation Quality:  Appropriate  Affect:  Appropriate  Cognitive:  Appropriate  Insight:  Good  Engagement in Group:  Engaged  Modes of Intervention:  Discussion  Summary of Progress/Problems:  Redmond Pulling 09/02/2022, 9:57 AM

## 2022-09-03 NOTE — Progress Notes (Signed)
[facility] MD Progress Note  09/03/2022 8:29 AM Gina Hendricks  MRN:  [id number]  Principal Problem: MDD (major depressive disorder) Diagnosis: Principal Problem:   MDD (major depressive disorder) Active Problems:   MDD (major depressive disorder), recurrent severe, without psychosis  Reason for Admission:  Gina Hendricks is a 62 yo female with PPHX of MDD, alcohol use disorder in sustained remission, and benzodiazepine use disorder presenting voluntarily to [facility] for worsening depression and suicidal ideation. Admitted on 08/26/2022.  (admitted on 08/26/2022, total  LOS: 8 days )  Yesterday, the psychiatry team made following recommendations:  Continue Cymbalta from 60 to 90mg  for depression and anxiety Continue Seroquel 50mg  nightly for insomnia As needed: Trazodone 50 mg for insomnia, melatonin 3 mg  Chart Review Attended all groups, participation ranged from minimal to appropriate. Reported anxiety, received PRN hydroxyzine.  Information Obtained Today During Patient Interview:  Patient evaluated on the unit. Poor eye contact. Reports sleep alright. Denies change in appetite. Reports that she is "alright" to most questions. Reports she is nervous about everything. Discussed daycare programming and she continues to be reluctant about it, but encouraged her to set it up.    Patient denies any suicidal ideation.  Denies any homicidal ideation.  No auditory or visual hallucinations.  No paranoid ideations.  No delusional thought processes including thought insertion, thought withdrawal, and ideas of reference.  Patient denies any side effects to currently prescribed psychiatric medications.  Denies any somatic complaints.  Past Psychiatric Hx: Current Psychiatrist: Denies Current Therapist: Denies Previous Psych Diagnoses:  MDD, recurrent, severe, with psychosis Anxiety disorder, unspecified Benzodiazepine use disorder Current psychiatric medications: Denies Psychiatric medication  history: Effexor-reports this is the most recent medication change she took.  Reports no improvement in symptoms Paxil Zoloft Prozac Wellbutrin Prior inpatient treatment: [facility] hospitalization 08/18/2020-08/27/2020 "The patient reported having suicidal ideation with a plan to overdose on her recently deceased mother's medications.  The patient was not taking any medications on admission having weaned herself off of Xanax with the last dose reportedly on July 12, 2020. " Medications at discharge include: Escitalopram 10 mg, hydroxyzine 25 mg, quetiapine 25 mg Current/prior outpatient treatment: History of suicide: as described in the HPI History of homicide or aggression: Denies Past Medical History:  Past Medical History:  Diagnosis Date   Anxiety    Anxiety disorder, unspecified 08/23/2020   Depression    Heart murmur    Family History:  Family History  Problem Relation Age of Onset   Arthritis Mother    Vision loss Mother    Heart disease Father    Alcohol abuse Brother    Depression Brother    Heart disease Maternal Grandmother    Alcohol abuse Maternal Grandfather    Heart disease Paternal Grandfather    Alcohol abuse Brother    Family History: Reports alcohol use on mother side.  Denies any history of suicide in her family.   Social History: Living: Lives alone in [place] on mom's property. Can drive but doesn't. Reports it is quiet, isolated.  Work: Unemployed, receiving SSA Marital Status: Single Children: Denies Abuse: None except as detailed in HPI Legal: Denies Military: Denies  Current Medications: Current Facility-Administered Medications  Medication Dose Route Frequency Provider Last Rate Last Admin   acetaminophen (TYLENOL) tablet 650 mg  650 mg Oral Q6H PRN Derrill Center, NP       alum & mag hydroxide-simeth (MAALOX/MYLANTA) 200-200-20 MG/5ML suspension 30 mL  30 mL Oral Q4H PRN Bobby Rumpf,  Marcy Panning, NP       DULoxetine (CYMBALTA) DR capsule 90 mg  90  mg Oral Daily Rolanda Lundborg, MD   90 mg at 09/03/22 0749   hydrOXYzine (ATARAX) tablet 10 mg  10 mg Oral TID PRN Derrill Center, NP   10 mg at 09/03/22 0749   LORazepam (ATIVAN) tablet 2 mg  2 mg Oral TID PRN Derrill Center, NP       Or   LORazepam (ATIVAN) injection 2 mg  2 mg Intramuscular TID PRN Derrill Center, NP       magnesium hydroxide (MILK OF MAGNESIA) suspension 30 mL  30 mL Oral Daily PRN Derrill Center, NP       melatonin tablet 3 mg  3 mg Oral QHS PRN Carrion-Carrero, Margely, MD       nicotine (NICODERM CQ - dosed in mg/24 hours) patch 21 mg  21 mg Transdermal Daily Carrion-Carrero, Margely, MD   21 mg at 09/03/22 0750   QUEtiapine (SEROQUEL) tablet 50 mg  50 mg Oral QHS Carrion-Carrero, Margely, MD   50 mg at 09/02/22 2136    Lab Results:  No results found for this or any previous visit (from the past 48 hour(s)).   Blood Alcohol level:  Lab Results  Component Value Date   ETH <10 08/25/2022   ETH <10 [id number]    Metabolic Labs: Lab Results  Component Value Date   HGBA1C 5.9 (H) 09/01/2022   MPG 123 09/01/2022   MPG 111.15 08/21/2020   Lab Results  Component Value Date   PROLACTIN 5.2 08/21/2020   Lab Results  Component Value Date   CHOL 164 09/01/2022   TRIG 62 09/01/2022   HDL 46 09/01/2022   CHOLHDL 3.6 09/01/2022   VLDL 12 09/01/2022   LDLCALC 106 (H) 09/01/2022   LDLCALC 102 (H) 08/21/2020    Sleep: 7.75 hours    Physical Findings: AIMS: No  Psychiatric Specialty Exam:  Presentation  General Appearance: Appropriate for Environment; Casual; Fairly Groomed  Eye Contact:Fair  Speech:Clear and Coherent; Normal Rate  Speech Volume:Normal  Handedness:Right   Mood and Affect  Mood:Depressed; Anxious; Hopeless "Alright, I don't know what's happening. I wish you could tell me"   Affect: Flat   Thought Process  Thought Processes:Coherent; Goal Directed; Linear  Descriptions of Associations:Intact  Orientation:Full (Time,  Place and Person)  Thought Content:Logical; WDL  History of Schizophrenia/Schizoaffective disorder:No  Duration of Psychotic Symptoms: None Hallucinations: denies   Ideas of Reference:None  Suicidal Thoughts: denies  Homicidal Thoughts: denies  Sensorium  Memory:Immediate Fair  Judgment: Improving   Insight: Improving    Executive Functions  Concentration:Fair  Attention Span:Fair  [place]   Psychomotor Activity  Psychomotor Activity: None   Assets  Assets:Communication Skills; Desire for Improvement; Resilience   Sleep  Sleep: Improved   Physical Exam Constitutional:      Appearance: the patient is not toxic-appearing.  Pulmonary:     Effort: Pulmonary effort is normal.  Neurological:     General: No focal deficit present.     Mental Status: the patient is alert and oriented to person, place, and time.   Review of Systems  Respiratory:  Negative for shortness of breath.   Cardiovascular:  Negative for chest pain.  Gastrointestinal:  Negative for abdominal pain, constipation, diarrhea, nausea and vomiting.  Neurological:  Negative for headaches.   Blood pressure (!) 85/61, pulse (!) 106, temperature 98 F (36.7 C),  temperature source Oral, resp. rate 16, height 4\' 11"  (1.499 m), weight 49.6 kg, SpO2 97 %. Body mass index is 22.1 kg/m.  Treatment Plan Summary: Daily contact with patient to assess and evaluate symptoms and progress in treatment and Medication management   ASSESSMENT: Gina Hendricks is a 62 yo female with PPHX of MDD, alcohol use disorder in sustained remission, and benzodiazepine use disorder presenting voluntarily to [facility] for worsening depression and suicidal ideation. Admitted on 08/26/2022. Reports cymbalta is helping, increased to 90mg .    Diagnoses / Active Problems: Dysthymia Alcohol use disorder, in sustained remission Benzodiazepine use disorder, in sustained remission Cannabis  use disorder, in sustained remission   PLAN: Safety and Monitoring:             -- VOLUNTARY admission to inpatient psychiatric unit for safety, stabilization and treatment             -- Daily contact with patient to assess and evaluate symptoms and progress in treatment             -- Patient's case to be discussed in multi-disciplinary team meeting             -- Observation Level : q15 minute checks             -- Vital signs:  q12 hours             -- Precautions: suicide, elopement, and assault   2. Psychiatric Diagnoses and Treatment:  Continue Cymbalta DR 90 mg daily for depression and anxiety Discontinue trazodone Continue Seroquel 50 mg nightly for insomnia Chaplain consulted, could consider [place] outpatient   -- The risks/benefits/side-effects/alternatives to this medication were discussed in detail with the patient and time was given for questions. The patient consents to medication trial.              -- Encouraged patient to participate in unit milieu and in scheduled group therapies              -- Short Term Goals: Ability to identify changes in lifestyle to reduce recurrence of condition will improve and Ability to verbalize feelings will improve             -- Long Term Goals: Improvement in symptoms so as ready for discharge   3. Medical Issues Being Addressed:              Tobacco Use Disorder             -- Nicotine patch 21mg /24 hours ordered             -- Smoking cessation encouraged  Hypokalemia, repleted   Metabolic monitoring on antipsychotic A1c 5.9 LDL 106  4. Discharge Planning:              -- Social work and case management to assist with discharge planning and identification of hospital follow-up needs prior to discharge  -- Discussed finding day program close to home for activities              -- Estimated LOS: 5-7 days, date of d/c 4/5 -- Discussed with sister, patient to follow-up with [facility] for day programming              -- Discharge  Concerns: Need to establish a safety plan; Medication compliance and effectiveness             -- Discharge Goals: Return home with outpatient referrals for mental health follow-up including  medication management/psychotherapy   I certify that inpatient services furnished can reasonably be expected to improve the patient's condition.    Rolanda Lundborg, MD PGY-1, Psychiatry Residency  4/4/20248:29 AM

## 2022-09-03 NOTE — Group Note (Signed)
Occupational Therapy Group Note  Group Topic: Sleep Hygiene  Group Date: 09/03/2022 Start Time: 1430 End Time: 1500 Facilitators: Brantley Stage, OT   Group Description: Group encouraged increased participation and engagement through topic focused on sleep hygiene. Patients reflected on the quality of sleep they typically receive and identified areas that need improvement. Group was given background information on sleep and sleep hygiene, including common sleep disorders. Group members also received information on how to improve one's sleep and introduced a sleep diary as a tool that can be utilized to track sleep quality over a length of time. Group session ended with patients identifying one or more strategies they could utilize or implement into their sleep routine in order to improve overall sleep quality.        Therapeutic Goal(s):  Identify one or more strategies to improve overall sleep hygiene  Identify one or more areas of sleep that are negatively impacted (sleep too much, too little, etc)     Participation Level: Engaged   Participation Quality: Independent   Behavior: Appropriate   Speech/Thought Process: Relevant   Affect/Mood: Appropriate   Insight: Fair   Judgement: Fair   Individualization: pt was engaged in their participation of group discussion/activity. New skills were identified  Modes of Intervention: Education  Patient Response to Interventions:  Attentive   Plan: Continue to engage patient in OT groups 2 - 3x/week.  09/03/2022  Brantley Stage, OT  Cornell Barman, OT

## 2022-09-03 NOTE — Plan of Care (Signed)
Collateral Call  Patient's sister, Theresa Duty was contacted on 4/4 at 3:10pm, at [phone] .  Patient's current diagnosis was explained as evidenced by specific signs/symptoms.  Assisted sister with understanding implications for treatment based on diagnosis and current level of behaviors/symptoms. Sister asked pertinent questions pertaining to understanding diagnosis and implications for treatment. Discussed referral to [facility] adult day program and need for patient to follow-up with them. Sister in agreement.   Rolanda Lundborg, MD PGY-1

## 2022-09-03 NOTE — Progress Notes (Signed)
Pt denied SI/HI/AVH this morning. Pt rated her anxiety a 6/10. PRN Hydroxyzine given for anxiety. Pt has been pleasant, calm, and cooperative throughout the shift. Pt given scheduled medications as prescribed. Q15 min checks verified for safety. Patient compliant with medications and treatment plan. Patient is interacting well on the unit. Pt is safe on the unit.   09/03/22 0900  Psych Admission Type (Psych Patients Only)  Admission Status Voluntary  Psychosocial Assessment  Patient Complaints Anxiety  Eye Contact Fair  Facial Expression Flat  Affect Anxious  Speech Logical/coherent  Interaction Assertive  Motor Activity Fidgety  Appearance/Hygiene Unremarkable  Behavior Characteristics Cooperative;Anxious  Mood Anxious  Thought Process  Coherency WDL  Content WDL  Delusions None reported or observed  Perception WDL  Hallucination None reported or observed  Judgment WDL  Confusion None  Danger to Self  Current suicidal ideation? Denies  Agreement Not to Harm Self Yes  Description of Agreement Verbal  Danger to Others  Danger to Others None reported or observed

## 2022-09-03 NOTE — BHH Suicide Risk Assessment (Signed)
[facility] Discharge Suicide Risk Assessment  Principal Problem: MDD (major depressive disorder) Discharge Diagnoses: Principal Problem:   MDD (major depressive disorder) Active Problems:   MDD (major depressive disorder), recurrent severe, without psychosis  Reason for admission: Gina Hendricks is a 62 yo female with PPHX of MDD, alcohol use disorder in sustained remission, and benzodiazepine use disorder presenting voluntarily to [facility] for worsening depression and suicidal ideation. Admitted on 08/26/2022.   PTA Medications: None  Hospital Course:   During the patient's hospitalization, patient had extensive initial psychiatric evaluation, and follow-up psychiatric evaluations every day.  Psychiatric diagnoses provided upon initial assessment:  Dysthymia Alcohol use disorder, in sustained remission Benzodiazepine use disorder, in sustained remission Cannabis use disorder, in sustained remission  Patient's psychiatric medications were adjusted on admission:  Start Cymbalta 30 mg daily for depression and anxiety As needed: Trazodone 50 mg for insomnia, melatonin 3 mg  During the hospitalization, other adjustments were made to the patient's psychiatric medication regimen:  INCREASE Cymbalta DR to 90 mg daily for depression and anxiety  START Seroquel 50 mg nightly for insomnia   During the hospitalization,  A1c 5.9 LDL 106 Hypokalemia, repleted   Patient's care was discussed during the interdisciplinary team meeting every day during the hospitalization.  The patient denied having side effects to prescribed psychiatric medication.  Gradually, patient started adjusting to milieu. The patient was evaluated each day by a clinical provider to ascertain response to treatment. Improvement was noted by the patient's report of decreasing symptoms, improved sleep and appetite, affect, medication tolerance, behavior, and participation in unit programming.  Patient was asked each day to complete a  self inventory noting mood, mental status, pain, new symptoms, anxiety and concerns.    Symptoms were reported as significantly decreased or resolved completely by discharge.   On day of discharge, the patient reports that their mood is stable. The patient denied having suicidal thoughts for more than 48 hours prior to discharge.  Patient denies having homicidal thoughts.  Patient denies having auditory hallucinations.  Patient denies any visual hallucinations or other symptoms of psychosis. The patient was motivated to continue taking medication with a goal of continued improvement in mental health.   The patient reports their target psychiatric symptoms of depression responded well to the psychiatric medications, and the patient reports overall benefit other psychiatric hospitalization. Supportive psychotherapy was provided to the patient. The patient also participated in regular group therapy while hospitalized. Coping skills, problem solving as well as relaxation therapies were also part of the unit programming.  Labs were reviewed with the patient, and abnormal results were discussed with the patient.  The patient is able to verbalize their individual safety plan to this provider.  Behavioral Events: None  Restraints: None  Groups: Attended some groups  D/C Medications: Cymbalta DR 90 mg, Seroquel 50 mg nightly   Sleep  Sleep: Reports good   Musculoskeletal: Strength & Muscle Tone: within normal limits Gait & Station: normal Patient leans: N/A  Psychiatric Specialty Exam  General Appearance: appears at stated age, fairly dressed and groomed, slightly disheveled    Behavior: cooperative    Psychomotor Activity:No psychomotor agitation or retardation noted                                 Eye Contact: limited but improving  Speech: normal amount, tone, volume and latency     Mood: euthymic  Affect: restricted  Thought Process: linear, goal directed Descriptions of  Associations: intact Thought Content: Hallucinations: Denies AH, VH  Delusions: Denies Paranoia  Suicidal Thoughts: Denies SI, intention, plan  Homicidal Thoughts: Denies HI, intention, plan    Alertness/Orientation: alert and oriented to self, place situation    Insight: improved  Judgment: improved    Memory: intact   Executive Functions  Concentration: intact  Attention Span: fair Recall: intact Fund of Knowledge: fair   Assets  Assets: Manufacturing systems engineer; Desire for Improvement; Resilience     Sleep Sleep: Reports good      Physical Exam:  Constitutional:      Appearance: the patient is not toxic-appearing.  Pulmonary:     Effort: Pulmonary effort is normal.  Neurological:     General: No focal deficit present.     Mental Status: the patient is alert and oriented to person, place, and time.    Review of Systems  Respiratory:  Negative for shortness of breath.   Cardiovascular:  Negative for chest pain.  Gastrointestinal:  Negative for abdominal pain, constipation, diarrhea, nausea and vomiting.  Neurological:  Negative for headaches.    Blood pressure (!) 92/57, pulse 83, temperature 98.1 F (36.7 C), temperature source Oral, resp. rate 14, height  (1.499 m), weight 49.6 kg, SpO2 97 %. Body mass index is 22.1 kg/m.  Mental Status Per Nursing Assessment::   On Admission:  Suicidal ideation indicated by patient  Demographic Factors:  Caucasian, Low socioeconomic status, Living alone, and Unemployed  Loss Factors: Decrease in vocational status  Historical Factors: Impulsivity  Risk Reduction Factors:   Positive social support  Continued Clinical Symptoms:  Depression:   Anhedonia  Cognitive Features That Contribute To Risk:  Closed-mindedness    Suicide Risk:  Mild:  Suicidal ideation of limited frequency, intensity, duration, and specificity.  There are no identifiable plans, no associated intent, mild dysphoria and related symptoms,  good self-control (both objective and subjective assessment), few other risk factors, and identifiable protective factors, including available and accessible social support.   Follow-up Information     [facility] [place], [place]. Go in 1 week(s).   Why: Please arrive for you appointment on 09/07/2022 @ 10:00 am Contact information: [address] [place], [place] [address]  [phone]        ([facility] Follow up.   Why: Enrollment process: Tour [employer] and meet the participants, staff & Director A first-day visit free of charge provides an opportunity to experience the center before making the commitment to enroll Application TB and medical screening  You have been referred to his program. Please follow-up with the director. Contact information: [address] in [place]. Transportation assistance is available through [facility].  Kathyrn Drown CENTER DIRECTOR AT [phone] OR [email or web address] .[facility]                Discharge recommendations:    Activity: as tolerated  Diet: heart healthy  # It is recommended to the patient to continue psychiatric medications as prescribed, after discharge from the hospital.     # It is recommended to the patient to follow up with your outpatient psychiatric provider -instructions on appointment date, time, and address (location) are provided to you in discharge paperwork  # Follow-up with outpatient primary care doctor and other specialists -for management of chronic medical disease, including:  -Prediabetes   # Testing: Follow-up with outpatient provider for abnormal lab results:  A1c 5.9, LDL 106   #  It was discussed with the patient, the impact of alcohol, drugs, tobacco have been there overall psychiatric and medical wellbeing, and total abstinence from substance use was recommended to the patient.   # Prescriptions provided or sent directly to preferred pharmacy at discharge. Patient  agreeable to plan. Given opportunity to ask questions. Appears to feel comfortable with discharge.    # In the event of worsening symptoms, the patient is instructed to call the crisis hotline, 911 and or go to the nearest ED for appropriate evaluation and treatment of symptoms. To follow-up with primary care provider for other medical issues, concerns and or health care needs  Patient agrees with D/C instructions and plan.   Total Time Spent in Direct Patient Care:  I personally spent 30 minutes on the unit in direct patient care. The direct patient care time included face-to-face time with the patient, reviewing the patient's chart, communicating with other professionals, and coordinating care. Greater than 50% of this time was spent in counseling or coordinating care with the patient regarding goals of hospitalization, psycho-education, and discharge planning needs.   Karie Fetch, MD, PGY-1 09/04/2022, 9:41 AM

## 2022-09-03 NOTE — Progress Notes (Signed)
   09/03/22 2100  Psych Admission Type (Psych Patients Only)  Admission Status Voluntary  Psychosocial Assessment  Patient Complaints Anxiety  Eye Contact Fair  Facial Expression Animated;Anxious  Affect Anxious  Speech Logical/coherent;Soft  Interaction Assertive  Motor Activity Fidgety  Appearance/Hygiene Unremarkable  Behavior Characteristics Cooperative;Anxious  Mood Anxious  Thought Process  Coherency WDL  Content WDL  Delusions None reported or observed  Perception WDL  Hallucination None reported or observed  Judgment WDL  Confusion None  Danger to Self  Current suicidal ideation? Denies  Agreement Not to Harm Self Yes  Description of Agreement verbal  Danger to Others  Danger to Others None reported or observed

## 2022-09-03 NOTE — Group Note (Signed)
Date:  09/03/2022 Time:  1:48 PM  Group Topic/Focus:  Wellness Toolbox:   The focus of this group is to discuss various aspects of wellness, balancing those aspects and exploring ways to increase the ability to experience wellness.  Patients will create a wellness toolbox for use upon discharge.    Participation Level:  Active  Participation Quality:  Appropriate  Affect:  Appropriate  Cognitive:  Appropriate  Insight: Appropriate  Engagement in Group:  Engaged  Modes of Intervention:  Clarification and Discussion  Additional Comments:  Pt was engaged in Self encouragement group  Gina Hendricks 09/03/2022, 1:48 PM

## 2022-09-03 NOTE — Group Note (Signed)
Date:  09/03/2022 Time:  9:34 AM  Group Topic/Focus:  Orientation:   The focus of this group is to educate the patient on the purpose and policies of crisis stabilization and provide a format to answer questions about their admission.  The group details unit policies and expectations of patients while admitted.    Participation Level:  Active  Participation Quality:  Appropriate  Affect:  Appropriate  Cognitive:  Appropriate  Insight: Appropriate  Engagement in Group:  Engaged  Modes of Intervention:  Discussion  Additional Comments:  Pt wants to talk to Dr about discharge planning  Garvin Fila 09/03/2022, 9:34 AM

## 2022-09-03 NOTE — BHH Group Notes (Signed)
Pt attended wrap up group  Pt rated day out 5 of 10.  Something Positive: Not Applicable  Goal for tomorrow: Get through the day

## 2022-09-03 NOTE — Discharge Summary (Addendum)
Physician Discharge Summary Note  Patient:  Gina Hendricks is an 62 y.o., female MRN:  [id number] DOB:  07/11/1960 Patient phone:  [phone] (home)  Patient address:   [address] [place] [address],  Total Time spent with patient: 45 minutes  Date of Admission:  08/26/2022 Date of Discharge: 09/04/22  Reason for Admission: Gina Hendricks is a 62 yo female with PPHX of MDD, alcohol use disorder in sustained remission, and benzodiazepine use disorder presenting voluntarily to [facility] for worsening depression and suicidal ideation. Admitted on 08/26/2022.   Principal Problem: MDD (major depressive disorder) Discharge Diagnoses: Principal Problem:   MDD (major depressive disorder) Active Problems:   MDD (major depressive disorder), recurrent severe, without psychosis  Past Psychiatric History:  Current Psychiatrist: Denies Current Therapist: Denies Previous Psych Diagnoses:  MDD, recurrent, severe, with psychosis Anxiety disorder, unspecified Benzodiazepine use disorder Current psychiatric medications: Denies Psychiatric medication history: Effexor-reports this is the most recent medication change she took.  Reports no improvement in symptoms Paxil Zoloft Prozac Wellbutrin Prior inpatient treatment: [facility] hospitalization 08/18/2020-08/27/2020 "The patient reported having suicidal ideation with a plan to overdose on her recently deceased mother's medications.  The patient was not taking any medications on admission having weaned herself off of Xanax with the last dose reportedly on July 12, 2020. " Medications at discharge include: Escitalopram 10 mg, hydroxyzine 25 mg, quetiapine 25 mg Current/prior outpatient treatment: History of suicide: as described in the H&P History of homicide or aggression: Denies  Past Medical History:  Past Medical History:  Diagnosis Date   Anxiety    Anxiety disorder, unspecified 08/23/2020   Depression    Heart murmur     Past  Surgical History:  Procedure Laterality Date   FINGER SURGERY Right    right index finger   Family History:  Family History  Problem Relation Age of Onset   Arthritis Mother    Vision loss Mother    Heart disease Father    Alcohol abuse Brother    Depression Brother    Heart disease Maternal Grandmother    Alcohol abuse Maternal Grandfather    Heart disease Paternal Grandfather    Alcohol abuse Brother    Family Psychiatric  History: Reports alcohol use on mother side. Denies any history of suicide in her family.   Social History:  Social History   Substance and Sexual Activity  Alcohol Use No     Social History   Substance and Sexual Activity  Drug Use No    Social History   Socioeconomic History   Marital status: Single    Spouse name: Not on file   Number of children: Not on file   Years of education: Not on file   Highest education level: Not on file  Occupational History   Not on file  Tobacco Use   Smoking status: Every Day    Packs/day: 2    Types: Cigarettes    Start date: 08/24/1980   Smokeless tobacco: Never  Vaping Use   Vaping Use: Never used  Substance and Sexual Activity   Alcohol use: No   Drug use: No   Sexual activity: Not Currently  Other Topics Concern   Not on file  Social History Narrative   Not on file   Social Determinants of Health   Financial Resource Strain: Not on file  Food Insecurity: No Food Insecurity (08/26/2022)   Hunger Vital Sign    Worried About Running Out of Food in the Last Year:  Never true    Ran Out of Food in the Last Year: Never true  Transportation Needs: No Transportation Needs (08/26/2022)   PRAPARE - Administrator, Civil Service (Medical): No    Lack of Transportation (Non-Medical): No  Physical Activity: Not on file  Stress: Stress Concern Present (10/13/2021)   [employer] of Occupational Health - Occupational Stress Questionnaire    Feeling of Stress : Rather much  Social  Connections: Not on file    Hospital Course:    During the patient's hospitalization, patient had extensive initial psychiatric evaluation, and follow-up psychiatric evaluations every day.   Psychiatric diagnoses provided upon initial assessment:  Dysthymia Alcohol use disorder, in sustained remission Benzodiazepine use disorder, in sustained remission Cannabis use disorder, in sustained remission   Patient's psychiatric medications were adjusted on admission:  Start Cymbalta 30 mg daily for depression and anxiety As needed: Trazodone 50 mg for insomnia, melatonin 3 mg   During the hospitalization, other adjustments were made to the patient's psychiatric medication regimen:  INCREASE Cymbalta DR to 90 mg daily for depression and anxiety  START Seroquel 50 mg nightly for insomnia    During the hospitalization,  A1c 5.9 LDL 106 Hypokalemia, repleted    Patient's care was discussed during the interdisciplinary team meeting every day during the hospitalization.   The patient denied having side effects to prescribed psychiatric medication.   Gradually, patient started adjusting to milieu. The patient was evaluated each day by a clinical provider to ascertain response to treatment. Improvement was noted by the patient's report of decreasing symptoms, improved sleep and appetite, affect, medication tolerance, behavior, and participation in unit programming.  Patient was asked each day to complete a self inventory noting mood, mental status, pain, new symptoms, anxiety and concerns.     Symptoms were reported as significantly decreased or resolved completely by discharge.    On day of discharge, the patient reports that their mood is stable. The patient denied having suicidal thoughts for more than 48 hours prior to discharge.  Patient denies having homicidal thoughts.  Patient denies having auditory hallucinations.  Patient denies any visual hallucinations or other symptoms of psychosis.  The patient was motivated to continue taking medication with a goal of continued improvement in mental health.    The patient reports their target psychiatric symptoms of depression responded well to the psychiatric medications, and the patient reports overall benefit other psychiatric hospitalization. Supportive psychotherapy was provided to the patient. The patient also participated in regular group therapy while hospitalized. Coping skills, problem solving as well as relaxation therapies were also part of the unit programming.   Labs were reviewed with the patient, and abnormal results were discussed with the patient.   The patient is able to verbalize their individual safety plan to this provider.   Behavioral Events: None   Restraints: None   Groups: Attended some groups   D/C Medications: Cymbalta DR 90 mg, Seroquel 50 mg nightly   # It is recommended to the patient to continue psychiatric medications as prescribed, after discharge from the hospital.    # It is recommended to the patient to follow up with your outpatient psychiatric provider and PCP.  # It was discussed with the patient, the impact of alcohol, drugs, tobacco have been there overall psychiatric and medical wellbeing, and total abstinence from substance use was recommended to the patient.  # Prescriptions provided or sent directly to preferred pharmacy at discharge. Patient agreeable to plan. Given  opportunity to ask questions. Appears to feel comfortable with discharge.    # In the event of worsening symptoms, the patient is instructed to call the crisis hotline, 911 and or go to the nearest ED for appropriate evaluation and treatment of symptoms. To follow-up with primary care provider for other medical issues, concerns and or health care needs  # Patient was discharged home with a plan to follow up as noted below.   Physical Findings: No stiffness, cogwheeling, or tremors noted on exam.    Musculoskeletal: Strength & Muscle Tone: within normal limits Gait & Station: normal Patient leans: N/A  Psychiatric Specialty Exam  General Appearance: appears at stated age, fairly dressed and groomed, slightly disheveled   Behavior: cooperative   Psychomotor Activity:No psychomotor agitation or retardation noted                                Eye Contact: limited but improving  Speech: normal amount, tone, volume and latency   Mood: euthymic  Affect: restricted   Thought Process: linear, goal directed Descriptions of Associations: intact Thought Content: Hallucinations: Denies AH, VH  Delusions: Denies Paranoia  Suicidal Thoughts: Denies SI, intention, plan  Homicidal Thoughts: Denies HI, intention, plan   Alertness/Orientation: alert and oriented to self, place situation   Insight: improved  Judgment: improved   Memory: intact  Executive Functions  Concentration: intact  Attention Span: fair Recall: intact Fund of Knowledge: fair  Assets  Assets: Manufacturing systems engineer; Desire for Improvement; Resilience   Sleep Sleep: Reports good    Physical Exam:  Constitutional:      Appearance: the patient is not toxic-appearing.  Pulmonary:     Effort: Pulmonary effort is normal.  Neurological:     General: No focal deficit present.     Mental Status: the patient is alert and oriented to person, place, and time.   Review of Systems  Respiratory:  Negative for shortness of breath.   Cardiovascular:  Negative for chest pain.  Gastrointestinal:  Negative for abdominal pain, constipation, diarrhea, nausea and vomiting.  Neurological:  Negative for headaches.   Blood pressure (!) 92/57, pulse 83, temperature 98.1 F (36.7 C), temperature source Oral, resp. rate 14, height 4\' 11"  (1.499 m), weight 49.6 kg, SpO2 97 %. Body mass index is 22.1 kg/m.   Social History   Tobacco Use  Smoking Status Every Day   Packs/day: 2   Types: Cigarettes   Start date:  08/24/1980  Smokeless Tobacco Never   Tobacco Cessation:  An FDA-approved tobacco cessation medication recommended at discharge  Blood Alcohol level:  Lab Results  Component Value Date   [facility] <10 08/25/2022   ETH <10 08/21/2020    Metabolic Disorder Labs:  Lab Results  Component Value Date   HGBA1C 5.9 (H) 09/01/2022   MPG 123 09/01/2022   MPG 111.15 08/21/2020   Lab Results  Component Value Date   PROLACTIN 5.2 08/21/2020   Lab Results  Component Value Date   CHOL 164 09/01/2022   TRIG 62 09/01/2022   HDL 46 09/01/2022   CHOLHDL 3.6 09/01/2022   VLDL 12 09/01/2022   LDLCALC 106 (H) 09/01/2022   LDLCALC 102 (H) 08/21/2020    Discharge destination:  Home  Is patient on multiple antipsychotic therapies at discharge:  No   Has Patient had three or more failed trials of antipsychotic monotherapy by history:  No  Recommended Plan for Multiple Antipsychotic Therapies:  NA   Allergies as of 09/04/2022       Reactions   Zoloft [sertraline Hcl] Other (See Comments)   "out of body experience"        Medication List     STOP taking these medications    busPIRone 10 MG tablet Commonly known as: BUSPAR   predniSONE 10 MG tablet Commonly known as: DELTASONE       TAKE these medications      Indication  DULoxetine 30 MG capsule Commonly known as: CYMBALTA Take 3 capsules (90 mg total) by mouth daily. What changed:  medication strength how much to take  Indication: Major Depressive Disorder   hydrOXYzine 10 MG tablet Commonly known as: ATARAX Take 1 tablet (10 mg total) by mouth every 8 (eight) hours as needed for anxiety.  Indication: Feeling Anxious   ibuprofen 200 MG tablet Commonly known as: ADVIL Take 200-400 mg by mouth every 6 (six) hours as needed for moderate pain.  Indication: Pain   nicotine 21 mg/24hr patch Commonly known as: NICODERM CQ - dosed in mg/24 hours Place 1 patch (21 mg total) onto the skin daily.  Indication: Nicotine  Addiction   QUEtiapine 50 MG tablet Commonly known as: SEROQUEL Take 1 tablet (50 mg total) by mouth at bedtime. What changed: See the new instructions.  Indication: Insomnia        Follow-up Information     [facility] [place], [place]. Go in 1 week(s).   Why: Please arrive for you appointment on 09/07/2022 @ 10:00 am Contact information: [address] [place], [place] [address]  [phone]        (LEAF) Adult Day Program Follow up.   Why: Enrollment process: Tour [facility] and meet the participants, staff & Director A first-day visit free of charge provides an opportunity to experience the center before making the commitment to enroll Application TB and medical screening  You have been referred to his program. Please follow-up with the director. Contact information: [address] in [place]. Transportation assistance is available through [facility].  Kathyrn Drown CENTER DIRECTOR AT [phone] OR [email or web address]                Discharge recommendations:  Activity: as tolerated  Diet: heart healthy  # It is recommended to the patient to continue psychiatric medications as prescribed, after discharge from the hospital.     # It is recommended to the patient to follow up with your outpatient psychiatric provider -instructions on appointment date, time, and address (location) are provided to you in discharge paperwork  # Follow-up with outpatient primary care doctor and other specialists -for management of chronic medical disease, including:  -Prediabetes   # Testing: Follow-up with outpatient provider for abnormal lab results:  A1c 5.9, LDL 106    # It was discussed with the patient, the impact of alcohol, drugs, tobacco have been there overall psychiatric and medical wellbeing, and total abstinence from substance use was recommended to the patient.   # Prescriptions provided or sent directly to preferred pharmacy at  discharge. Patient agreeable to plan. Given opportunity to ask questions. Appears to feel comfortable with discharge.    # In the event of worsening symptoms, the patient is instructed to call the crisis hotline, 911 and or go to the nearest ED for appropriate evaluation and treatment of symptoms. To follow-up with primary care provider for other medical issues, concerns and or health care needs  Patient agrees with D/C  instructions and plan.   Total Time Spent in Direct Patient Care:  I personally spent 30 minutes on the unit in direct patient care. The direct patient care time included face-to-face time with the patient, reviewing the patient's chart, communicating with other professionals, and coordinating care. Greater than 50% of this time was spent in counseling or coordinating care with the patient regarding goals of hospitalization, psycho-education, and discharge planning needs.    Signed: Karie Fetch, MD, PGY-1 09/04/2022, 9:41 AM

## 2022-09-03 NOTE — Progress Notes (Signed)
   09/03/22 0615  15 Minute Checks  Location Bedroom  Visual Appearance Calm  Behavior Sleeping  Sleep (Behavioral Health Patients Only)  Calculate sleep? (Click Yes once per 24 hr at 0600 safety check) Yes  Documented sleep last 24 hours 7.5

## 2022-09-03 NOTE — Progress Notes (Signed)
Chaplain met with Gina Hendricks to provide support.  She shared about the state of her depression and how she doesn't know how to continue, but feels that she cannot take her life because she does not want to go to hell. She shared about the loss of her mother around 6 months after her depression worsened. She was unable to go to see her mom before she died or attend her funeral because she could not leave the house due to her depression.  She is angry at herself for that and for not being able to care for herself. She used to be independent and always took care of herself.  Her depression began in the late 80's.  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  It did not have a significant impact on her at the time but she is unable to forgive herself for that.  Chaplain explored  having compassion on the 62 year old who was scared when she found herself pregnant.  Chaplain also explored having compassion on the person who was too sick to attend her mother's funeral.  She was able to see how she would have compassion on someone else in similar situations, but could not allow herself to forgive herself.  She requested prayer, which chaplain provided. Chaplain will plan to attempt follow up later in the week.  [address], [place] Pager, [phone]

## 2022-09-04 MED ORDER — NICOTINE 21 MG/24HR TD PT24: 21.0000 mg | MEDICATED_PATCH | Freq: Every day | TRANSDERMAL | 0 refills | Status: DC

## 2022-09-04 MED ORDER — HYDROXYZINE HCL 10 MG PO TABS: 10.0000 mg | ORAL_TABLET | Freq: Three times a day (TID) | ORAL | 0 refills | Status: DC | PRN

## 2022-09-04 MED ORDER — DULOXETINE HCL 30 MG PO CPEP: 90.0000 mg | ORAL_CAPSULE | Freq: Every day | ORAL | 0 refills | Status: DC

## 2022-09-04 MED ORDER — QUETIAPINE FUMARATE 50 MG PO TABS: 50.0000 mg | ORAL_TABLET | Freq: Every day | ORAL | 0 refills | Status: DC

## 2022-09-04 NOTE — Progress Notes (Signed)
  [facility] Adult Case Management Discharge Plan :  Will you be returning to the same living situation after discharge:  Yes,  Gina Hendricks At discharge, do you have transportation home?: Yes,  Friend Do you have the ability to pay for your medications: Yes,  Insured  Release of information consent forms completed and in the chart;  Patient's signature needed at discharge.  Patient to Follow up at:  Follow-up Information     [facility] [place], [place]. Go in 1 week(s).   Why: Please arrive for you appointment on 09/07/2022 @ 10:00 am Contact information: [address] [place], [place] [address]  [phone]        (LEAF) Adult Day Program Follow up.   Why: Enrollment process: Tour [employer] and meet the participants, staff & Director A first-day visit free of charge provides an opportunity to experience the center before making the commitment to enroll Application TB and medical screening  You have been referred to his program. Please follow-up with the director. Contact information: [address] in [place]. Transportation assistance is available through [facility].  Kathyrn Drown CENTER DIRECTOR AT [phone] OR [email or web address]                Next level of care provider has access to [facility] Link:yes  Safety Planning and Suicide Prevention discussed: No. Declined     Has patient been referred to the Quitline?: Patient refused referral  Patient has been referred for addiction treatment: N/A Patient to continue working towards treatment goals after discharge. Patient no longer meets criteria for inpatient criteria per attending physician. Continue taking medications as prescribed, nursing to provide instructions at discharge. Follow up with all scheduled appointments.   Gina Hendricks S Gina Shearer, LCSW 09/04/2022, 10:19 AM

## 2022-09-04 NOTE — Group Note (Signed)
Recreation Therapy Group Note   Group Topic:Team Building  Group Date: 09/04/2022 Start Time: 0930 End Time: 1002 Facilitators: Saara Kijowski-McCall, LRT,CTRS Location: [address] Dayroom   Goal Area(s) Addresses:  Patient will effectively work with peer towards shared goal.  Patient will identify skills used to make activity successful.  Patient will identify how skills used during activity can be used to reach post d/c goals.    Group Description: Landing Pad. In teams of 3-5, patients were given 12 plastic drinking straws and an equal length of masking tape. Using the materials provided, patients were asked to build a landing pad to catch a golf ball dropped from approximately 5 feet in the air. All materials were required to be used by the team in their design. LRT facilitated post-activity discussion.   Affect/Mood: Appropriate   Participation Level: Minimal   Participation Quality: Independent   Behavior: On-looking   Speech/Thought Process: Barely audible    Insight: N/A   Judgement: N/A   Modes of Intervention: STEM Activity   Patient Response to Interventions:  Attentive   Education Outcome:  Acknowledges education and In group clarification offered    Clinical Observations/Individualized Feedback: Pt attended and did more observing than engaging in activity.    Plan: Continue to engage patient in RT group sessions 2-3x/week.   Brooklinn Longbottom-McCall, LRT,CTRS 09/04/2022 11:03 AM

## 2022-09-04 NOTE — BHH Group Notes (Signed)
[facility] Group Notes:  (Nursing/MHT/Case Management/Adjunct)  Date:  09/04/2022  Time:  10:07 AM  Type of Therapy:  Group Therapy  Participation Level:  Active  Participation Quality:  Appropriate  Affect:  Appropriate  Cognitive:  Appropriate  Insight:  Improving  Engagement in Group:  Engaged  Modes of Intervention:  Discussion  Summary of Progress/Problems:  Gina Hendricks 09/04/2022, 10:07 AM

## 2022-09-04 NOTE — Progress Notes (Signed)
   09/04/22 0645  15 Minute Checks  Location Dayroom  Visual Appearance Calm  Behavior Composed  Sleep (Behavioral Health Patients Only)  Calculate sleep? (Click Yes once per 24 hr at 0600 safety check) Yes  Documented sleep last 24 hours 5.5

## 2022-09-04 NOTE — Progress Notes (Signed)
Discharge Note:  Patient discharged via transport.  Suicide prevention information given and discussed with patient who stated she understood and had no questions.  Denied SI and HI.  Denied A/V hallucinations.  Denied pain.  Patient stated she appreciated all assistance by [facility] staff.  All required discharge information given.  Patient stated she received all her belongings, clothing, toiletries, misc items, etc.

## 2022-09-04 NOTE — Plan of Care (Signed)
Nurse discussed anxiety, depression and coping skills with patient.  

## 2022-09-17 ENCOUNTER — Encounter: Payer: Self-pay | Admitting: Family Medicine

## 2022-09-17 ENCOUNTER — Ambulatory Visit (INDEPENDENT_AMBULATORY_CARE_PROVIDER_SITE_OTHER): Payer: 59 | Admitting: Family Medicine

## 2022-09-17 VITALS — Temp 98.1°F | Ht 59.0 in | Wt 122.0 lb

## 2022-09-17 DIAGNOSIS — F332 Major depressive disorder, recurrent severe without psychotic features: Secondary | ICD-10-CM

## 2022-09-17 DIAGNOSIS — F411 Generalized anxiety disorder: Secondary | ICD-10-CM | POA: Diagnosis not present

## 2022-09-17 MED ORDER — QUETIAPINE FUMARATE ER 300 MG PO TB24: 300.0000 mg | ORAL_TABLET | Freq: Every day | ORAL | 1 refills | Status: DC

## 2022-09-17 MED ORDER — HYDROXYZINE HCL 25 MG PO TABS: 25.0000 mg | ORAL_TABLET | Freq: Three times a day (TID) | ORAL | 1 refills | Status: DC | PRN

## 2022-09-17 MED ORDER — DULOXETINE HCL 60 MG PO CPEP: 60.0000 mg | ORAL_CAPSULE | Freq: Two times a day (BID) | ORAL | 1 refills | Status: DC

## 2022-09-17 NOTE — Progress Notes (Signed)
Subjective:  Patient ID: Gina Hendricks, female    DOB: 1960-07-30  Age: 62 y.o. MRN: [id number]  CC: Depression, Anxiety, and Insomnia   HPI Gina Hendricks presents for struggling due to severe depression, anxiety with insomnia.Got soIncreased it to three a day. me relief with vistaril . Seroquel not helping with sleep.      09/17/2022    2:36 PM 03/16/2022   10:12 AM 01/12/2022    8:09 AM  Depression screen PHQ 2/9  Decreased Interest Down, Depressed, Hopeless PHQ - 2 Score Altered sleeping Tired, decreased energy Change in appetite Feeling bad or failure about yourself  Trouble concentrating 3 3 0  Moving slowly or fidgety/restless Suicidal thoughts 1 1 0  PHQ-9 Score Difficult doing work/chores Very difficult Extremely dIfficult Extremely dIfficult    History Gina Hendricks has a past medical history of Anxiety, Anxiety disorder, unspecified (08/23/2020), Depression, and Heart murmur.   Gina Hendricks has a past surgical history that includes Finger surgery (Right).   Gina Hendricks family history includes Alcohol abuse in Gina Hendricks brother, brother, and maternal grandfather; Arthritis in Gina Hendricks mother; Depression in Gina Hendricks brother; Heart disease in Gina Hendricks father, maternal grandmother, and paternal grandfather; Vision loss in Gina Hendricks mother.Gina Hendricks reports that Gina Hendricks has been smoking cigarettes. Gina Hendricks started smoking about 42 years ago. Gina Hendricks has been smoking an average of 2 packs per day. Gina Hendricks has never used smokeless tobacco. Gina Hendricks reports that Gina Hendricks does not drink alcohol and does not use drugs.    ROS Review of Systems  Constitutional: Negative.   HENT: Negative.    Eyes:  Negative for visual disturbance.  Respiratory:  Negative for shortness of breath.   Cardiovascular:  Negative for chest pain.  Gastrointestinal:  Negative for abdominal pain.  Musculoskeletal:  Negative for arthralgias.    Objective:  Temp 98.1 F (36.7 C)   Ht  (1.499 m)   Wt 122  lb (55.3 kg)   BMI 24.64 kg/m   BP Readings from Last 3 Encounters:  09/04/22 (!) 92/57  08/26/22 94/60  08/05/22 101/77    Wt Readings from Last 3 Encounters:  09/17/22 122 lb (55.3 kg)  08/26/22 109 lb 6.4 oz (49.6 kg)  08/25/22 116 lb 13.5 oz (53 kg)     Physical Exam Constitutional:      General: Gina Hendricks is not in acute distress.    Appearance: Gina Hendricks is well-developed.  Cardiovascular:     Rate and Rhythm: Normal rate and regular rhythm.  Pulmonary:     Breath sounds: Normal breath sounds.  Musculoskeletal:        General: Normal range of motion.  Skin:    General: Skin is warm and dry.  Neurological:     Mental Status: Gina Hendricks is alert and oriented to person, place, and time.       Assessment & Plan:   Ajee was seen today for depression, anxiety and insomnia.  Diagnoses and all orders for this visit:  MDD (major depressive disorder), recurrent severe, without psychosis -     Ambulatory referral to Psychiatry  GAD (generalized anxiety disorder)  Other orders -     DULoxetine (CYMBALTA) 60 MG capsule; Take 1 capsule (60 mg total) by mouth 2 (two) times daily. -     QUEtiapine (SEROQUEL XR) 300 MG 24  hr tablet; Take 1 tablet (300 mg total) by mouth at bedtime. -     hydrOXYzine (ATARAX) 25 MG tablet; Take 1 tablet (25 mg total) by mouth every 8 (eight) hours as needed for anxiety.       I have discontinued Briahna D. Aki's QUEtiapine. I have also changed Gina Hendricks DULoxetine and hydrOXYzine. Additionally, I am having Gina Hendricks start on QUEtiapine. Lastly, I am having Gina Hendricks maintain Gina Hendricks ibuprofen and nicotine.  Allergies as of 09/17/2022       Reactions   Zoloft [sertraline Hcl] Other (See Comments)   "out of body experience"        Medication List        Accurate as of September 17, 2022 11:59 PM. If you have any questions, ask your nurse or doctor.          STOP taking these medications    QUEtiapine 50 MG tablet Commonly known as: SEROQUEL Replaced by:  QUEtiapine 300 MG 24 hr tablet Stopped by: Mechele Claude, MD       TAKE these medications    DULoxetine 60 MG capsule Commonly known as: CYMBALTA Take 1 capsule (60 mg total) by mouth 2 (two) times daily. What changed:  medication strength how much to take when to take this Changed by: Mechele Claude, MD   hydrOXYzine 25 MG tablet Commonly known as: ATARAX Take 1 tablet (25 mg total) by mouth every 8 (eight) hours as needed for anxiety. What changed:  medication strength how much to take Changed by: Mechele Claude, MD   ibuprofen 200 MG tablet Commonly known as: ADVIL Take 200-400 mg by mouth every 6 (six) hours as needed for moderate pain.   nicotine 21 mg/24hr patch Commonly known as: NICODERM CQ - dosed in mg/24 hours Place 1 patch (21 mg total) onto the skin daily. (May buy from over the counter): For smoking cessation   QUEtiapine 300 MG 24 hr tablet Commonly known as: SEROquel XR Take 1 tablet (300 mg total) by mouth at bedtime. Replaces: QUEtiapine 50 MG tablet Started by: Mechele Claude, MD         Follow-up: Return in about 1 month (around 10/17/2022).  Mechele Claude, M.D.

## 2022-10-10 ENCOUNTER — Other Ambulatory Visit: Payer: Self-pay | Admitting: Family Medicine

## 2022-12-18 ENCOUNTER — Other Ambulatory Visit: Payer: Self-pay | Admitting: Family Medicine

## 2022-12-22 ENCOUNTER — Emergency Department (HOSPITAL_COMMUNITY)
Admission: EM | Admit: 2022-12-22 | Discharge: 2022-12-22 | Disposition: A | Discharge status: Home / Self Care | Payer: 59 | Attending: Emergency Medicine | Admitting: Emergency Medicine

## 2022-12-22 ENCOUNTER — Emergency Department (HOSPITAL_COMMUNITY): Payer: 59

## 2022-12-22 ENCOUNTER — Encounter (HOSPITAL_COMMUNITY): Payer: Self-pay | Admitting: Emergency Medicine

## 2022-12-22 ENCOUNTER — Other Ambulatory Visit: Payer: Self-pay

## 2022-12-22 DIAGNOSIS — R109 Unspecified abdominal pain: Secondary | ICD-10-CM | POA: Diagnosis not present

## 2022-12-22 DIAGNOSIS — R197 Diarrhea, unspecified: Secondary | ICD-10-CM | POA: Diagnosis not present

## 2022-12-22 DIAGNOSIS — N200 Calculus of kidney: Secondary | ICD-10-CM | POA: Diagnosis not present

## 2022-12-22 DIAGNOSIS — R110 Nausea: Secondary | ICD-10-CM | POA: Insufficient documentation

## 2022-12-22 DIAGNOSIS — K449 Diaphragmatic hernia without obstruction or gangrene: Secondary | ICD-10-CM | POA: Diagnosis not present

## 2022-12-22 DIAGNOSIS — F419 Anxiety disorder, unspecified: Secondary | ICD-10-CM | POA: Diagnosis not present

## 2022-12-22 DIAGNOSIS — Z743 Need for continuous supervision: Secondary | ICD-10-CM | POA: Diagnosis not present

## 2022-12-22 LAB — URINALYSIS, ROUTINE W REFLEX MICROSCOPIC
Bacteria, UA: NONE SEEN
Bilirubin Urine: NEGATIVE
Glucose, UA: NEGATIVE mg/dL
Hgb urine dipstick: NEGATIVE
Ketones, ur: 5 mg/dL — AB
Leukocytes,Ua: NEGATIVE
Nitrite: NEGATIVE
Protein, ur: 30 mg/dL — AB
Specific Gravity, Urine: 1.01 (ref 1.005–1.030)
pH: 7 (ref 5.0–8.0)

## 2022-12-22 LAB — COMPREHENSIVE METABOLIC PANEL
ALT: 31 U/L (ref 0–44)
AST: 30 U/L (ref 15–41)
Albumin: 4.2 g/dL (ref 3.5–5.0)
Alkaline Phosphatase: 120 U/L (ref 38–126)
Anion gap: 10 (ref 5–15)
BUN: 9 mg/dL (ref 6–20)
CO2: 20 mmol/L — ABNORMAL LOW (ref 22–32)
Calcium: 9.2 mg/dL (ref 8.9–10.3)
Chloride: 108 mmol/L (ref 98–111)
Creatinine, Ser: 0.63 mg/dL (ref 0.44–1.00)
GFR, Estimated: >60 mL/min (ref >60)
Glucose, Bld: 120 mg/dL — ABNORMAL HIGH (ref 70–99)
Potassium: 3.4 mmol/L — ABNORMAL LOW (ref 3.5–5.1)
Sodium: 138 mmol/L (ref 135–145)
Total Bilirubin: 0.3 mg/dL (ref 0.3–1.2)
Total Protein: 8.3 g/dL — ABNORMAL HIGH (ref 6.5–8.1)

## 2022-12-22 LAB — CBC
HCT: 40 % (ref 36.0–46.0)
Hemoglobin: 13.4 g/dL (ref 12.0–15.0)
MCH: 30.6 pg (ref 26.0–34.0)
MCHC: 33.5 g/dL (ref 30.0–36.0)
MCV: 91.3 fL (ref 80.0–100.0)
Platelets: 263 10*3/uL (ref 150–400)
RBC: 4.38 MIL/uL (ref 3.87–5.11)
RDW: 13.2 % (ref 11.5–15.5)
WBC: 11.5 10*3/uL — ABNORMAL HIGH (ref 4.0–10.5)
nRBC: 0 % (ref 0.0–0.2)

## 2022-12-22 LAB — CBG MONITORING, ED: Glucose-Capillary: 129 mg/dL — ABNORMAL HIGH (ref 70–99)

## 2022-12-22 LAB — LIPASE, BLOOD: Lipase: 28 U/L (ref 11–51)

## 2022-12-22 MED ORDER — IOHEXOL 300 MG/ML  SOLN
100.0000 mL | Freq: Once | INTRAMUSCULAR | Status: AC | PRN
  Administered 2022-12-22: 100 mL via INTRAVENOUS

## 2022-12-22 MED ORDER — SODIUM CHLORIDE 0.9 % IV BOLUS
1000.0000 mL | Freq: Once | INTRAVENOUS | Status: AC
  Administered 2022-12-22: 1000 mL via INTRAVENOUS

## 2022-12-22 NOTE — ED Provider Notes (Signed)
[place] EMERGENCY DEPARTMENT AT [facility] Provider Note   CSN: [id number] Arrival date & time: 12/22/22  1359     History  Chief Complaint  Patient presents with   Diarrhea   Anxiety   HPI Gina Hendricks is a 62 y.o. female with MDD, GAD presenting for diarrhea and anxiety. Diarrhea started about 2 weeks ago. Endorses some nausea but no vomiting .States it has improved but has been persistent. Denies fever at home. Endorses poor appetite as well and has been drinking Boost to supplement calories but state she still feels "terrible". Endorses intermittent abdominal pain across the mid abdomen. She called EMS today and started to feel nervous and began "hyperventilating" which she states is common when she is having an "anxiety attack".    Diarrhea Anxiety       Home Medications Prior to Admission medications   Medication Sig Start Date End Date Taking? Authorizing Provider  DULoxetine (CYMBALTA) 60 MG capsule Take 1 capsule (60 mg total) by mouth 2 (two) times daily. 09/17/22   Mechele Claude, MD  hydrOXYzine (ATARAX) 25 MG tablet TAKE 1 TABLET BY MOUTH EVERY 8 HOURS AS NEEDED FOR ANXIETY. 10/12/22   Mechele Claude, MD  ibuprofen (ADVIL) 200 MG tablet Take 200-400 mg by mouth every 6 (six) hours as needed for moderate pain.    [provider]  nicotine (NICODERM CQ - DOSED IN MG/24 HOURS) 21 mg/24hr patch Place 1 patch (21 mg total) onto the skin daily. (May buy from over the counter): For smoking cessation 09/04/22   Karie Fetch, MD  QUEtiapine (SEROQUEL XR) 300 MG 24 hr tablet TAKE 1 TABLET BY MOUTH EVERYDAY AT BEDTIME 12/18/22   Mechele Claude, MD      Allergies    Zoloft [sertraline hcl]    Review of Systems   Review of Systems  Gastrointestinal:  Positive for diarrhea.    Physical Exam   Vitals:   12/22/22 1409 12/22/22 1503  BP: 116/89   Pulse: 88   Resp: (!) 28   Temp: 98 F (36.7 C) 98.2 F (36.8 C)  SpO2: 100%     CONSTITUTIONAL:   well-appearing, NAD NEURO:  Alert and oriented x 3, CN 3-12 grossly intact EYES:  eyes equal and reactive ENT/NECK:  Supple, no stridor  CARDIO:  Regular rate and rhythm, appears well-perfused  PULM:  No respiratory distress, CTAB GI/GU:  non-distended, soft, non tender MSK/SPINE:  No gross deformities, no edema, moves all extremities  SKIN:  no rash, atraumatic  *Additional and/or pertinent findings included in MDM below  ED Results / Procedures / Treatments   Labs (all labs ordered are listed, but only abnormal results are displayed) Labs Reviewed  COMPREHENSIVE METABOLIC PANEL - Abnormal; Notable for the following components:      Result Value   Potassium 3.4 (*)    CO2 20 (*)    Glucose, Bld 120 (*)    Total Protein 8.3 (*)    All other components within normal limits  CBC - Abnormal; Notable for the following components:   WBC 11.5 (*)    All other components within normal limits  URINALYSIS, ROUTINE W REFLEX MICROSCOPIC - Abnormal; Notable for the following components:   Ketones, ur 5 (*)    Protein, ur 30 (*)    All other components within normal limits  CBG MONITORING, ED - Abnormal; Notable for the following components:   Glucose-Capillary 129 (*)    All other components within normal limits  LIPASE, BLOOD    EKG None  Radiology CT ABDOMEN PELVIS W CONTRAST  Result Date: 12/22/2022 CLINICAL DATA:  Abdominal pain, diarrhea EXAM: CT ABDOMEN AND PELVIS WITH CONTRAST TECHNIQUE: Multidetector CT imaging of the abdomen and pelvis was performed using the standard protocol following bolus administration of intravenous contrast. RADIATION DOSE REDUCTION: This exam was performed according to the departmental dose-optimization program which includes automated exposure control, adjustment of the mA and/or kV according to patient size and/or use of iterative reconstruction technique. CONTRAST:  OMNIPAQUE IOHEXOL 300 MG/ML  SOLN COMPARISON:  Images of previous study done  on 02/27/2016 are not available for comparison at the time of this report. Report for the previous study was reviewed. FINDINGS: Lower chest: No acute findings are seen. Hepatobiliary: No focal abnormalities are seen in liver. There is no dilation of bile ducts. Gallbladder is unremarkable. Pancreas: Unremarkable. Spleen: Unremarkable. Adrenals/Urinary Tract: Adrenals are unremarkable. There is no hydronephrosis. There is 2 mm calculus in the mid to lower portion of right kidney. There are no calcific densities in the courses of the ureters. Urinary bladder is unremarkable. Stomach/Bowel: Small hiatal hernia is seen. Stomach is unremarkable. Small bowel loops are not dilated. Appendix is not dilated. There is no significant wall thickening in colon. There is no pericolic stranding. Vascular/Lymphatic: Arterial calcifications are seen in aorta and its major branches. Reproductive: Uterus appears smaller than usual in size. Other: There is no ascites or pneumoperitoneum. Musculoskeletal: There is minimal retrolisthesis of the L4-L5 level. Spinal stenosis is seen at L4-L5 level. There is narrowing of neural foramina at multiple levels. IMPRESSION: There is no evidence of intestinal obstruction or pneumoperitoneum. There is no hydronephrosis. Appendix is not dilated. Small hiatal hernia. There is 2 mm right renal calculus. Aortic arteriosclerosis. Lumbar spondylosis. Electronically Signed   By: Ernie Avena M.D.   On: 12/22/2022 16:50    Procedures Procedures    Medications Ordered in ED Medications  sodium chloride 0.9 % bolus 1,000 mL (1,000 mLs Intravenous New Bag/Given 12/22/22 1505)  iohexol (OMNIPAQUE) 300 MG/ML solution 100 mL (100 mLs Intravenous Contrast Given 12/22/22 1610)    ED Course/ Medical Decision Making/ A&P                             Medical Decision Making Amount and/or Complexity of Data Reviewed Labs: ordered. Radiology: ordered.  Risk Prescription drug  management.   62 year old well-appearing female presenting for diarrhea and anxiety.  Exam was unremarkable.  DDx includes intra-abdominal infection, dehydration, metabolic derangement, anxiety.  Labs reassuring.  CT revealed small right renal calculus but otherwise no acute findings.  After treatment patient states she felt better.  Given that her diarrhea is improving, suspect viral illness.  Did advise her to follow-up with her PCP.  Discussed pertinent return precautions.  Vital stable.  Discharged home.        Final Clinical Impression(s) / ED Diagnoses Final diagnoses:  Diarrhea, unspecified type  Anxiety    Rx / DC Orders ED Discharge Orders     None         Gareth Eagle, PA-C 12/22/22 1704    Cathren Laine, MD 12/30/22 [phone]

## 2022-12-22 NOTE — Discharge Instructions (Signed)
Evaluation today was overall reassuring.  Recommend you follow-up with your PCP for your diarrhea and your anxiety.  If your symptoms worsen or change in any way please return emergency department for further evaluation.

## 2022-12-22 NOTE — ED Notes (Signed)
Pt returned from CT °

## 2022-12-22 NOTE — ED Triage Notes (Signed)
Pt via [facility] from home c/o diarrhea and poor appetite x 2 weeks with anxiety. Pt appears unwell in triage. She reports fatigue and says she has been drinking Boost drinks for calories but still feels terrible. Intermittent abd pain that feels like gas cramps.   BP 108/62 en route O2 99% RA

## 2022-12-30 ENCOUNTER — Telehealth: Payer: Self-pay

## 2022-12-30 NOTE — Telephone Encounter (Signed)
Transition Care Management Unsuccessful Follow-up Telephone Call  Date of discharge and from where:  [facility]  Attempts:  1st Attempt  Reason for unsuccessful TCM follow-up call:  No answer/busy   [name] [facility] Guide, [facility] [phone] [address] [place], [place], [place] [address] Phone: [phone] Email: [email or web address]

## 2022-12-30 NOTE — Telephone Encounter (Signed)
Transition Care Management Unsuccessful Follow-up Telephone Call  Date of discharge and from where:  Gina Hendricks 7/23  Attempts:  2nd Attempt  Reason for unsuccessful TCM follow-up call:  No answer/busy   [name] [facility] Guide, [facility] [phone] [address] [place], [place], [place] [address] Phone: [phone] Email: [email or web address]

## 2023-01-01 ENCOUNTER — Telehealth: Payer: Self-pay | Admitting: Family Medicine

## 2023-01-01 NOTE — Telephone Encounter (Signed)
  Incoming Patient Call  01/01/2023  What symptoms do you have? Hip pain  How long have you been sick? A few days per pt   Have you been seen for this problem? Made appt for 01/04/23  If your provider decides to give you a prescription, which pharmacy would you like for it to be sent to?  [id number] - [place], [place] - [address]     Patient informed that this information will be sent to the clinical staff for review and that they should receive a follow up call.

## 2023-01-04 ENCOUNTER — Encounter: Payer: Self-pay | Admitting: Nurse Practitioner

## 2023-01-04 ENCOUNTER — Ambulatory Visit (INDEPENDENT_AMBULATORY_CARE_PROVIDER_SITE_OTHER): Payer: 59 | Admitting: Nurse Practitioner

## 2023-01-04 ENCOUNTER — Ambulatory Visit (INDEPENDENT_AMBULATORY_CARE_PROVIDER_SITE_OTHER): Payer: 59

## 2023-01-04 VITALS — BP 97/64 | HR 67 | Temp 97.5°F | Ht 59.0 in | Wt 127.8 lb

## 2023-01-04 DIAGNOSIS — M25552 Pain in left hip: Secondary | ICD-10-CM | POA: Diagnosis not present

## 2023-01-04 DIAGNOSIS — M8588 Other specified disorders of bone density and structure, other site: Secondary | ICD-10-CM | POA: Diagnosis not present

## 2023-01-04 MED ORDER — METHYLPREDNISOLONE ACETATE 80 MG/ML IJ SUSP
80.0000 mg | Freq: Once | INTRAMUSCULAR | Status: AC
Start: 2023-01-04 — End: 2023-01-04
  Administered 2023-01-04: 80 mg via INTRAMUSCULAR

## 2023-01-04 NOTE — Addendum Note (Signed)
Addended by: Evern Bio, Dois Davenport on: 01/04/2023 04:07 PM   Modules accepted: Level of Service

## 2023-01-04 NOTE — Progress Notes (Signed)
Acute Office Visit  Subjective:     Patient ID: Gina Hendricks, female    DOB: 1961/05/22, 62 y.o.   MRN: [id number]  Chief Complaint  Patient presents with   Hip Pain    Left side hip pain started last Friday. Unsure of what caused pain. Has been taking ibuprofen with minimal relief.     HPI Hip Pain: Patient complains of left hip pain. Onset of the symptoms was several days ago. Inciting event: none. Current symptoms include is worse with sitting . Associated symptoms: none. Aggravating symptoms: any weight bearing. Patient's overall course: began worsening 3 days ago. Patient has had no prior hip problems. Previous visits for this problem: none. Evaluation to date: none.  Treatment to date: OTC analgesics, which have been somewhat effective.   Depression  Not wel controlled as client reports that she has not been taking her medications. She as an upcoming appointment with PCP to discuss MDD medication.      01/04/2023   10:39 AM 09/17/2022    2:36 PM 03/16/2022   10:12 AM  PHQ9 SCORE ONLY  PHQ-9 Total Score 27 25 25      Review of Systems  Constitutional:  Negative for chills and fever.  Respiratory:  Negative for shortness of breath.   Cardiovascular:  Negative for chest pain and leg swelling.  Gastrointestinal:  Negative for blood in stool, melena, nausea and vomiting.  Musculoskeletal:  Positive for joint pain. Negative for falls.  Skin:  Negative for itching and rash.  Neurological:  Positive for tingling. Negative for dizziness, weakness and headaches.  Endo/Heme/Allergies:  Negative for polydipsia. Does not bruise/bleed easily.   Negative unless indicated in HPI    Active Ambulatory Problems    Diagnosis Date Noted   Depression, major, single episode, moderate (HCC) 12/01/2019   Right hip pain 12/01/2019   MDD (major depressive disorder), recurrent severe, without psychosis (HCC)    Sedative dependence (HCC)    MDD (major depressive disorder), recurrent, severe,  with psychosis (HCC) 08/22/2020   GAD (generalized anxiety disorder) 08/23/2020   Anxiety 07/28/2021   Nausea 07/28/2021   MDD (major depressive disorder) 08/26/2022   Left hip pain 01/04/2023   Resolved Ambulatory Problems    Diagnosis Date Noted   No Resolved Ambulatory Problems   Past Medical History:  Diagnosis Date   Anxiety disorder, unspecified 08/23/2020   Depression    Heart murmur     Objective:    BP 97/64   Pulse 67   Temp (!) 97.5 F (36.4 C) (Temporal)   Ht 4\' 11"  (1.499 m)   Wt 127 lb 12.8 oz (58 kg)   SpO2 97%   BMI 25.81 kg/m  BP Readings from Last 3 Encounters:  01/04/23 97/64  12/22/22 109/87  08/26/22 94/60   Wt Readings from Last 3 Encounters:  01/04/23 127 lb 12.8 oz (58 kg)  12/22/22 125 lb (56.7 kg)  09/17/22 122 lb (55.3 kg)      Physical Exam Vitals and nursing note reviewed.  Constitutional:      Appearance: Normal appearance. She is normal weight.  HENT:     Head: Normocephalic and atraumatic.  Cardiovascular:     Rate and Rhythm: Normal rate and regular rhythm.     Pulses: Normal pulses.     Heart sounds: Normal heart sounds.  Musculoskeletal:     Cervical back: Normal range of motion and neck supple. No rigidity or tenderness.     Right hip: Normal.  Left hip: Tenderness present. Decreased range of motion.  Skin:    General: Skin is warm and dry.     Coloration: Skin is not jaundiced.     Findings: No rash.  Neurological:     General: No focal deficit present.     Mental Status: She is alert and oriented to person, place, and time. Mental status is at baseline.     No results found for any visits on 01/04/23.     Assessment & Plan:  Left hip pain -     DG HIP UNILAT W OR W/O PELVIS 2-3 VIEWS LEFT  Gina Hendricks was seen for left hip, no acute distress X-ray ordered  awaiting final report from radiologist  MDD: not well managed, wil follow with PCP  The above assessment and management plan was discussed with the  patient. The patient verbalized understanding of and has agreed to the management plan. Patient is aware to call the clinic if they develop any new symptoms or if symptoms persist or worsen. Patient is aware when to return to the clinic for a follow-up visit. Patient educated on when it is appropriate to go to the emergency department. a   Joint Injection/Arthrocentesis  Date/Time: 01/04/2023 11:39 AM  Performed by: Martina Sinner, NP Authorized by: Martina Sinner, NP  Indications: pain  Body area: hip Joint: left hip Local anesthesia used: yes  Anesthesia: Local anesthesia used: yes Local Anesthetic: lidocaine 1% with epinephrine  Sedation: Patient sedated: no  Needle size: 22 G Ultrasound guidance: no Approach: lateral Aspirate: clear Methylprednisolone amount: 80 mg Lidocaine 1% amount: 2 mL    Return for as already scheduled with PCP.      [place]ighter, DNP [facility] [address] [place], [place] [address] [phone]

## 2023-01-06 ENCOUNTER — Ambulatory Visit: Payer: 59

## 2023-01-06 ENCOUNTER — Encounter: Payer: Self-pay | Admitting: *Deleted

## 2023-01-06 VITALS — Ht 60.0 in | Wt 125.0 lb

## 2023-01-06 DIAGNOSIS — Z0000 Encounter for general adult medical examination without abnormal findings: Secondary | ICD-10-CM

## 2023-01-06 DIAGNOSIS — Z1231 Encounter for screening mammogram for malignant neoplasm of breast: Secondary | ICD-10-CM

## 2023-01-06 DIAGNOSIS — Z0100 Encounter for examination of eyes and vision without abnormal findings: Secondary | ICD-10-CM

## 2023-01-06 DIAGNOSIS — Z122 Encounter for screening for malignant neoplasm of respiratory organs: Secondary | ICD-10-CM

## 2023-01-06 DIAGNOSIS — Z1211 Encounter for screening for malignant neoplasm of colon: Secondary | ICD-10-CM

## 2023-01-06 NOTE — Patient Instructions (Signed)
Gina Hendricks , Thank you for taking time to come for your Medicare Wellness Visit. I appreciate your ongoing commitment to your health goals. Please review the following plan we discussed and let me know if I can assist you in the future.   Referrals/Orders/Follow-Ups/Clinician Recommendations: Aim for 30 minutes of exercise or brisk walking, 6-8 glasses of water, and 5 servings of fruits and vegetables each day.   This is a list of the screening recommended for you and due dates:  Health Maintenance  Topic Date Due   HIV Screening  Never done   Hepatitis C Screening  Never done   Screening for Lung Cancer  Never done   Mammogram  Never done   Zoster (Shingles) Vaccine (1 of 2) Never done   Pap Smear  08/25/2019   Colon Cancer Screening  11/01/2021   COVID-19 Vaccine (3 - 2023-24 season) 01/30/2022   Flu Shot  12/31/2022   Medicare Annual Wellness Visit  01/06/2024   DTaP/Tdap/Td vaccine (2 - Td or Tdap) 01/11/2028   HPV Vaccine  Aged Out    Advanced directives: (ACP Link)Information on Advanced Care Planning can be found at [facility] [place] Advance Health Care Directives Advance Health Care Directives ([email or web address]  Information on Advanced Care Planning can be found at [facility] Advance Health Care Directives Advance Health Care Directives ([email or web address]   Next Medicare Annual Wellness Visit scheduled for next year: Yes  Preventive Care 40-64 Years, Female Preventive care refers to lifestyle choices and visits with your health care provider that can promote health and wellness. What does preventive care include? A yearly physical exam. This is also called an annual well check. Dental exams once or twice a year. Routine eye exams. Ask your health care provider how often you should have your eyes checked. Personal lifestyle choices, including: Daily care of your teeth and gums. Regular physical activity. Eating a healthy diet. Avoiding tobacco and  drug use. Limiting alcohol use. Practicing safe sex. Taking low-dose aspirin daily starting at age 73. Taking vitamin and mineral supplements as recommended by your health care provider. What happens during an annual well check? The services and screenings done by your health care provider during your annual well check will depend on your age, overall health, lifestyle risk factors, and family history of disease. Counseling  Your health care provider may ask you questions about your: Alcohol use. Tobacco use. Drug use. Emotional well-being. Home and relationship well-being. Sexual activity. Eating habits. Work and work Astronomer. Method of birth control. Menstrual cycle. Pregnancy history. Screening  You may have the following tests or measurements: Height, weight, and BMI. Blood pressure. Lipid and cholesterol levels. These may be checked every 5 years, or more frequently if you are over 73 years old. Skin check. Lung cancer screening. You may have this screening every year starting at age 95 if you have a 30-pack-year history of smoking and currently smoke or have quit within the past 15 years. Fecal occult blood test (FOBT) of the stool. You may have this test every year starting at age 5. Flexible sigmoidoscopy or colonoscopy. You may have a sigmoidoscopy every 5 years or a colonoscopy every 10 years starting at age 103. Hepatitis C blood test. Hepatitis B blood test. Sexually transmitted disease (STD) testing. Diabetes screening. This is done by checking your blood sugar (glucose) after you have not eaten for a while (fasting). You may have this done every 1-3 years. Mammogram. This may be done  every 1-2 years. Talk to your health care provider about when you should start having regular mammograms. This may depend on whether you have a family history of breast cancer. BRCA-related cancer screening. This may be done if you have a family history of breast, ovarian, tubal, or  peritoneal cancers. Pelvic exam and Pap test. This may be done every 3 years starting at age 67. Starting at age 36, this may be done every 5 years if you have a Pap test in combination with an HPV test. Bone density scan. This is done to screen for osteoporosis. You may have this scan if you are at high risk for osteoporosis. Discuss your test results, treatment options, and if necessary, the need for more tests with your health care provider. Vaccines  Your health care provider may recommend certain vaccines, such as: Influenza vaccine. This is recommended every year. Tetanus, diphtheria, and acellular pertussis (Tdap, Td) vaccine. You may need a Td booster every 10 years. Zoster vaccine. You may need this after age 58. Pneumococcal 13-valent conjugate (PCV13) vaccine. You may need this if you have certain conditions and were not previously vaccinated. Pneumococcal polysaccharide (PPSV23) vaccine. You may need one or two doses if you smoke cigarettes or if you have certain conditions. Talk to your health care provider about which screenings and vaccines you need and how often you need them. This information is not intended to replace advice given to you by your health care provider. Make sure you discuss any questions you have with your health care provider. Document Released: 06/14/2015 Document Revised: 02/05/2016 Document Reviewed: 03/19/2015 Elsevier Interactive Patient Education  2017 [employer].    Fall Prevention in the Home Falls can cause injuries. They can happen to people of all ages. There are many things you can do to make your home safe and to help prevent falls. What can I do on the outside of my home? Regularly fix the edges of walkways and driveways and fix any cracks. Remove anything that might make you trip as you walk through a door, such as a raised step or threshold. Trim any bushes or trees on the path to your home. Use bright outdoor lighting. Clear any walking  paths of anything that might make someone trip, such as rocks or tools. Regularly check to see if handrails are loose or broken. Make sure that both sides of any steps have handrails. Any raised decks and porches should have guardrails on the edges. Have any leaves, snow, or ice cleared regularly. Use sand or salt on walking paths during winter. Clean up any spills in your garage right away. This includes oil or grease spills. What can I do in the bathroom? Use night lights. Install grab bars by the toilet and in the tub and shower. Do not use towel bars as grab bars. Use non-skid mats or decals in the tub or shower. If you need to sit down in the shower, use a plastic, non-slip stool. Keep the floor dry. Clean up any water that spills on the floor as soon as it happens. Remove soap buildup in the tub or shower regularly. Attach bath mats securely with double-sided non-slip rug tape. Do not have throw rugs and other things on the floor that can make you trip. What can I do in the bedroom? Use night lights. Make sure that you have a light by your bed that is easy to reach. Do not use any sheets or blankets that are too big for your  bed. They should not hang down onto the floor. Have a firm chair that has side arms. You can use this for support while you get dressed. Do not have throw rugs and other things on the floor that can make you trip. What can I do in the kitchen? Clean up any spills right away. Avoid walking on wet floors. Keep items that you use a lot in easy-to-reach places. If you need to reach something above you, use a strong step stool that has a grab bar. Keep electrical cords out of the way. Do not use floor polish or wax that makes floors slippery. If you must use wax, use non-skid floor wax. Do not have throw rugs and other things on the floor that can make you trip. What can I do with my stairs? Do not leave any items on the stairs. Make sure that there are handrails  on both sides of the stairs and use them. Fix handrails that are broken or loose. Make sure that handrails are as long as the stairways. Check any carpeting to make sure that it is firmly attached to the stairs. Fix any carpet that is loose or worn. Avoid having throw rugs at the top or bottom of the stairs. If you do have throw rugs, attach them to the floor with carpet tape. Make sure that you have a light switch at the top of the stairs and the bottom of the stairs. If you do not have them, ask someone to add them for you. What else can I do to help prevent falls? Wear shoes that: Do not have high heels. Have rubber bottoms. Are comfortable and fit you well. Are closed at the toe. Do not wear sandals. If you use a stepladder: Make sure that it is fully opened. Do not climb a closed stepladder. Make sure that both sides of the stepladder are locked into place. Ask someone to hold it for you, if possible. Clearly mark and make sure that you can see: Any grab bars or handrails. First and last steps. Where the edge of each step is. Use tools that help you move around (mobility aids) if they are needed. These include: Canes. Walkers. Scooters. Crutches. Turn on the lights when you go into a dark area. Replace any light bulbs as soon as they burn out. Set up your furniture so you have a clear path. Avoid moving your furniture around. If any of your floors are uneven, fix them. If there are any pets around you, be aware of where they are. Review your medicines with your doctor. Some medicines can make you feel dizzy. This can increase your chance of falling. Ask your doctor what other things that you can do to help prevent falls. This information is not intended to replace advice given to you by your health care provider. Make sure you discuss any questions you have with your health care provider. Document Released: 03/14/2009 Document Revised: 10/24/2015 Document Reviewed:  06/22/2014 Elsevier Interactive Patient Education  2017 [employer].

## 2023-01-06 NOTE — Progress Notes (Signed)
Subjective:   Gina Hendricks is a 62 y.o. female who presents for an Initial Medicare Annual Wellness Visit.  Visit Complete: Virtual  I connected with  Gina Hendricks on 01/06/23 by a audio enabled telemedicine application and verified that I am speaking with the correct person using two identifiers.  Patient Location: Home  Provider Location: Home Office  I discussed the limitations of evaluation and management by telemedicine. The patient expressed understanding and agreed to proceed.  Patient Medicare AWV questionnaire was completed by the patient on 080/11/2022; I have confirmed that all information answered by patient is correct and no changes since this date.  Review of Systems    Vital Signs: Unable to obtain new vitals due to this being a telehealth visit.  Cardiac Risk Factors include: advanced age (>105men, >75 women)     Objective:    Today's Vitals   01/06/23 1035  Weight: 125 lb (56.7 kg)  Height: 5' (1.524 m)   Body mass index is 24.41 kg/m.     01/06/2023   10:38 AM 12/22/2022    2:08 PM 08/26/2022    3:00 PM 08/22/2020    6:00 PM  Advanced Directives  Does Patient Have a Medical Advance Directive? Yes Yes    Type of Estate agent of [place];Living will Healthcare Power of Attorney    Copy of Healthcare Power of Attorney in Chart? No - copy requested     Would patient like information on creating a medical advance directive?         Information is confidential and restricted. Go to Review Flowsheets to unlock data.    Current Medications (verified) Outpatient Encounter Medications as of 01/06/2023  Medication Sig   DULoxetine (CYMBALTA) 60 MG capsule Take 1 capsule (60 mg total) by mouth 2 (two) times daily.   hydrOXYzine (ATARAX) 25 MG tablet TAKE 1 TABLET BY MOUTH EVERY 8 HOURS AS NEEDED FOR ANXIETY.   ibuprofen (ADVIL) 200 MG tablet Take 200-400 mg by mouth every 6 (six) hours as needed for moderate pain.   nicotine (NICODERM CQ -  DOSED IN MG/24 HOURS) 21 mg/24hr patch Place 1 patch (21 mg total) onto the skin daily. (May buy from over the counter): For smoking cessation   QUEtiapine (SEROQUEL XR) 300 MG 24 hr tablet TAKE 1 TABLET BY MOUTH EVERYDAY AT BEDTIME   No facility-administered encounter medications on file as of 01/06/2023.    Allergies (verified) Zoloft [sertraline hcl]   History: Past Medical History:  Diagnosis Date   Anxiety    Anxiety disorder, unspecified 08/23/2020   Depression    Heart murmur    Past Surgical History:  Procedure Laterality Date   FINGER SURGERY Right    right index finger   Family History  Problem Relation Age of Onset   Arthritis Mother    Vision loss Mother    Heart disease Father    Alcohol abuse Brother    Depression Brother    Heart disease Maternal Grandmother    Alcohol abuse Maternal Grandfather    Heart disease Paternal Grandfather    Alcohol abuse Brother    Social History   Socioeconomic History   Marital status: Single    Spouse name: Not on file   Number of children: Not on file   Years of education: Not on file   Highest education level: Not on file  Occupational History   Not on file  Tobacco Use   Smoking status: Every Day  Current packs/day: 2.00    Average packs/day: 2.0 packs/day for 42.4 years (84.7 ttl pk-yrs)    Types: Cigarettes    Start date: 08/24/1980   Smokeless tobacco: Never  Vaping Use   Vaping status: Never Used  Substance and Sexual Activity   Alcohol use: No   Drug use: No   Sexual activity: Not Currently  Other Topics Concern   Not on file  Social History Narrative   Not on file   Social Determinants of Health   Financial Resource Strain: Low Risk  (01/06/2023)   Overall Financial Resource Strain (CARDIA)    Difficulty of Paying Living Expenses: Not hard at all  Food Insecurity: No Food Insecurity (01/06/2023)   Hunger Vital Sign    Worried About Running Out of Food in the Last Year: Never true    Ran Out of Food  in the Last Year: Never true  Transportation Needs: No Transportation Needs (01/06/2023)   PRAPARE - Administrator, Civil Service (Medical): No    Lack of Transportation (Non-Medical): No  Physical Activity: Insufficiently Active (01/06/2023)   Exercise Vital Sign    Days of Exercise per Week: 2 days    Minutes of Exercise per Session: 30 min  Stress: No Stress Concern Present (01/06/2023)   [employer] of Occupational Health - Occupational Stress Questionnaire    Feeling of Stress : Not at all  Social Connections: Socially Isolated (01/06/2023)   Social Connection and Isolation Panel [NHANES]    Frequency of Communication with Friends and Family: More than three times a week    Frequency of Social Gatherings with Friends and Family: More than three times a week    Attends Religious Services: Never    Database administrator or Organizations: No    Attends Engineer, structural: Never    Marital Status: Never married    Tobacco Counseling Ready to quit: No Counseling given: Not Answered   Clinical Intake:  Pre-visit preparation completed: Yes  Pain : No/denies pain     Nutritional Risks: None Diabetes: No  How often do you need to have someone help you when you read instructions, pamphlets, or other written materials from your doctor or pharmacy?: 1 - Never  Interpreter Needed?: No  Information entered by :: Renie Ora, LPN   Activities of Daily Living    01/06/2023   10:38 AM 08/26/2022    2:00 PM  In your present state of health, do you have any difficulty performing the following activities:  Hearing? 0   Vision? 0   Difficulty concentrating or making decisions? 0   Walking or climbing stairs? 0   Dressing or bathing? 0   Doing errands, shopping? 0   Preparing Food and eating ? N   Using the Toilet? N   In the past six months, have you accidently leaked urine? N   Do you have problems with loss of bowel control? N   Managing your  Medications? N   Managing your Finances? N   Housekeeping or managing your Housekeeping? N      Information is confidential and restricted. Go to Review Flowsheets to unlock data.    Patient Care Team: Mechele Claude, MD as PCP - General (Family Medicine)  Indicate any recent Medical Services you may have received from other than [facility] providers in the past year (date may be approximate).     Assessment:   This is a routine wellness examination for Gina Hendricks.  Hearing/Vision  screen Vision Screening - Comments:: Referral 01/06/2023  Dietary issues and exercise activities discussed:     Goals Addressed             This Visit's Progress    Exercise 3x per week (30 min per time)         Depression Screen    01/06/2023   10:37 AM 01/04/2023   10:39 AM 09/17/2022    2:36 PM 03/16/2022   10:12 AM 01/12/2022    8:09 AM 12/03/2021   12:10 PM 10/30/2021    2:07 PM  PHQ 2/9 Scores  PHQ - 2 Score 0 6 6 6 6 5 6   PHQ- 9 Score 0 27 25 25 21  19     Fall Risk    01/06/2023   10:36 AM 01/04/2023   10:40 AM 09/17/2022    2:36 PM 09/17/2022    2:14 PM 03/16/2022   10:09 AM  Fall Risk   Falls in the past year? 0 0 0 0 0  Number falls in past yr: 0 0     Injury with Fall? 0 0     Risk for fall due to : No Fall Risks No Fall Risks     Follow up Falls prevention discussed Falls evaluation completed       MEDICARE RISK AT HOME:  Medicare Risk at Home - 01/06/23 1036     Any stairs in or around the home? Yes    If so, are there any without handrails? No    Home free of loose throw rugs in walkways, pet beds, electrical cords, etc? Yes    Adequate lighting in your home to reduce risk of falls? Yes    Life alert? No    Use of a cane, walker or w/c? No    Grab bars in the bathroom? Yes    Shower chair or bench in shower? Yes    Elevated toilet seat or a handicapped toilet? Yes             TIMED UP AND GO:  Was the test performed? No    Cognitive Function:        01/06/2023    10:39 AM  6CIT Screen  What Year? 0 points  What month? 0 points  What time? 0 points  Count back from 20 0 points  Months in reverse 0 points  Repeat phrase 0 points  Total Score 0 points    Immunizations Immunization History  Administered Date(s) Administered   Influenza,inj,Quad PF,6+ Mos 05/21/2017   Influenza-Unspecified 03/01/2018   Moderna Sars-Covid-2 Vaccination 09/07/2019, 10/10/2019   Tdap 01/10/2018    TDAP status: Up to date  Flu Vaccine status: Declined, Education has been provided regarding the importance of this vaccine but patient still declined. Advised may receive this vaccine at local pharmacy or Health Dept. Aware to provide a copy of the vaccination record if obtained from local pharmacy or Health Dept. Verbalized acceptance and understanding.  Pneumococcal vaccine status: Due, Education has been provided regarding the importance of this vaccine. Advised may receive this vaccine at local pharmacy or Health Dept. Aware to provide a copy of the vaccination record if obtained from local pharmacy or Health Dept. Verbalized acceptance and understanding.  Covid-19 vaccine status: Completed vaccines  Qualifies for Shingles Vaccine? Yes   Zostavax completed No   Shingrix Completed?: No.    Education has been provided regarding the importance of this vaccine. Patient has been advised to call insurance company to  determine out of pocket expense if they have not yet received this vaccine. Advised may also receive vaccine at local pharmacy or Health Dept. Verbalized acceptance and understanding.  Screening Tests Health Maintenance  Topic Date Due   HIV Screening  Never done   Hepatitis C Screening  Never done   Lung Cancer Screening  Never done   MAMMOGRAM  Never done   Zoster Vaccines- Shingrix (1 of 2) Never done   PAP SMEAR-Modifier  08/25/2019   Colonoscopy  11/01/2021   COVID-19 Vaccine (3 - 2023-24 season) 01/30/2022   INFLUENZA VACCINE  12/31/2022    Medicare Annual Wellness (AWV)  01/06/2024   DTaP/Tdap/Td (2 - Td or Tdap) 01/11/2028   HPV VACCINES  Aged Out    Health Maintenance  Health Maintenance Due  Topic Date Due   HIV Screening  Never done   Hepatitis C Screening  Never done   Lung Cancer Screening  Never done   MAMMOGRAM  Never done   Zoster Vaccines- Shingrix (1 of 2) Never done   PAP SMEAR-Modifier  08/25/2019   Colonoscopy  11/01/2021   COVID-19 Vaccine (3 - 2023-24 season) 01/30/2022   INFLUENZA VACCINE  12/31/2022    Colorectal cancer screening: Referral to GI placed 01/06/2023. Pt aware the office will call re: appt.  Mammogram status: Ordered 01/06/2023. Pt provided with contact info and advised to call to schedule appt.   Bone Density status: Ordered not of age . Pt provided with contact info and advised to call to schedule appt.  Lung Cancer Screening: (Low Dose CT Chest recommended if Age 71-80 years, 20 pack-year currently smoking OR have quit w/in 15years.) does qualify.   Lung Cancer Screening Referral: referral 08/0/2024  Additional Screening:  Hepatitis C Screening: does not qualify;   Vision Screening: Recommended annual ophthalmology exams for early detection of glaucoma and other disorders of the eye. Is the patient up to date with their annual eye exam?  No  Who is the provider or what is the name of the office in which the patient attends annual eye exams? Dr.Le  If pt is not established with a provider, would they like to be referred to a provider to establish care? No .   Dental Screening: Recommended annual dental exams for proper oral hygiene    Community Resource Referral / Chronic Care Management: CRR required this visit?  No   CCM required this visit?  No     Plan:     I have personally reviewed and noted the following in the patient's chart:   Medical and social history Use of alcohol, tobacco or illicit drugs  Current medications and supplements including opioid  prescriptions. Patient is not currently taking opioid prescriptions. Functional ability and status Nutritional status Physical activity Advanced directives List of other physicians Hospitalizations, surgeries, and ER visits in previous 12 months Vitals Screenings to include cognitive, depression, and falls Referrals and appointments  In addition, I have reviewed and discussed with patient certain preventive protocols, quality metrics, and best practice recommendations. A written personalized care plan for preventive services as well as general preventive health recommendations were provided to patient.     Lorrene Reid, LPN   06/06/1094   After Visit Summary: (MyChart) Due to this being a telephonic visit, the after visit summary with patients personalized plan was offered to patient via MyChart   Nurse Notes: Due pneumonia Vaccine

## 2023-01-13 ENCOUNTER — Telehealth: Payer: Self-pay | Admitting: Family Medicine

## 2023-01-13 NOTE — Telephone Encounter (Signed)
Steroid injection given at lat visit with Dois Davenport. Xray performed as well and results were normal.

## 2023-01-13 NOTE — Telephone Encounter (Signed)
Patient was seen on 8/5 for hip pain and is not feeling any better. Wants to know if something can be called in. Please call back and advise.

## 2023-01-14 ENCOUNTER — Other Ambulatory Visit: Payer: Self-pay | Admitting: Family Medicine

## 2023-01-14 MED ORDER — PREDNISONE 10 MG PO TABS: ORAL_TABLET | ORAL | 0 refills | Status: DC

## 2023-01-14 NOTE — Telephone Encounter (Signed)
Patient calling back to speak to nurse, please call back and advise

## 2023-01-14 NOTE — Telephone Encounter (Signed)
PATIENT AWARE

## 2023-01-14 NOTE — Telephone Encounter (Signed)
PATIENT REPORTS ADVIL IS NOT HELPING HER HIP PAIN, IS THERE ANYTHING ELSE THAT CAN BE PRESCRIBED

## 2023-01-14 NOTE — Telephone Encounter (Signed)
Please let the patient know that I sent their prescription to their pharmacy. Thanks, WS 

## 2023-01-25 ENCOUNTER — Other Ambulatory Visit: Payer: Self-pay | Admitting: Family Medicine

## 2023-01-29 NOTE — Telephone Encounter (Signed)
Spoke with patient. She is aware of the error and that she has a normal platelet count. Pt would like for her chart to be corrected. Clinical manager has been made aware of the incident.

## 2023-02-18 ENCOUNTER — Other Ambulatory Visit: Payer: Self-pay | Admitting: Family Medicine

## 2023-02-18 ENCOUNTER — Encounter: Payer: Self-pay | Admitting: Family Medicine

## 2023-02-18 ENCOUNTER — Ambulatory Visit (INDEPENDENT_AMBULATORY_CARE_PROVIDER_SITE_OTHER): Payer: 59 | Admitting: Family Medicine

## 2023-02-18 VITALS — BP 116/83 | HR 107 | Temp 97.2°F | Ht 60.0 in | Wt 138.0 lb

## 2023-02-18 DIAGNOSIS — M7062 Trochanteric bursitis, left hip: Secondary | ICD-10-CM | POA: Diagnosis not present

## 2023-02-18 MED ORDER — HYDROXYZINE HCL 25 MG PO TABS: 25.0000 mg | ORAL_TABLET | Freq: Three times a day (TID) | ORAL | 0 refills | Status: DC | PRN

## 2023-02-18 MED ORDER — MIRTAZAPINE 30 MG PO TABS: 30.0000 mg | ORAL_TABLET | Freq: Every day | ORAL | 2 refills | Status: DC

## 2023-02-18 MED ORDER — BETAMETHASONE SOD PHOS & ACET 6 (3-3) MG/ML IJ SUSP
6.0000 mg | Freq: Once | INTRAMUSCULAR | Status: AC
Start: 2023-02-18 — End: 2023-02-18
  Administered 2023-02-18: 6 mg via INTRAMUSCULAR

## 2023-02-18 MED ORDER — NABUMETONE 500 MG PO TABS: 1000.0000 mg | ORAL_TABLET | Freq: Two times a day (BID) | ORAL | 1 refills | Status: DC

## 2023-02-18 NOTE — Addendum Note (Signed)
Addended by: Julious Payer D on: 02/18/2023 02:38 PM   Modules accepted: Orders

## 2023-02-18 NOTE — Telephone Encounter (Signed)
Appt made 03-24-23 with [facility]

## 2023-02-18 NOTE — Progress Notes (Signed)
Subjective:  Patient ID: Gina Hendricks, female    DOB: [birth year]  Age: 62 y.o. MRN: [id number]  CC: Hip Pain   HPI Gina Hendricks presents for left hip pain0 for a month. Steroid shot helped only for a day. Moderately severe - 7/10,  radiates down thigh, but no back pain. Pt. States possibly flare of old injury 5 years ago. Advil helps.  PHQ reviewed. Pt. Denies all intent to hurt self or others also says she mistakenly stated that her concentration is poor. She is sleeping better since Dcing the cymbalta.       02/18/2023    9:29 AM 02/18/2023    9:28 AM 01/06/2023   10:37 AM 01/04/2023   10:39 AM 09/17/2022    2:36 PM  Depression screen PHQ 2/9  Decreased Interest  3 0 3 3  Down, Depressed, Hopeless  3 0 3 3  PHQ - 2 Score  6 0 6 6  Altered sleeping  3 0 3 3  Tired, decreased energy  3 0 3 3  Change in appetite  3 0 3 3  Feeling bad or failure about yourself   3 0 3 3  Trouble concentrating  3 0 3 3  Moving slowly or fidgety/restless  3 0 3 3  Suicidal thoughts -- 3 0 3 1  PHQ-9 Score  27 0 27 25  Difficult doing work/chores  Very difficult Not difficult at all Very difficult Very difficult       02/18/2023    9:29 AM 01/04/2023   10:39 AM 09/17/2022    2:37 PM 03/16/2022   10:13 AM  GAD 7 : Generalized Anxiety Score  Nervous, Anxious, on Edge 3 3 3 3   Control/stop worrying 3 3 3 3   Worry too much - different things 3 3 3 3   Trouble relaxing 3 3 3 3   Restless 3 3 3 3   Easily annoyed or irritable 3 3 3 3   Afraid - awful might happen 3 3 3 3   Total GAD 7 Score 21 21 21 21   Anxiety Difficulty Very difficult Very difficult Very difficult Extremely difficult        History Gina Hendricks has a past medical history of Anxiety, Anxiety disorder, unspecified (08/23/2020), Depression, and Heart murmur.   She has a past surgical history that includes Finger surgery (Right).   Her family history includes Alcohol abuse in her brother, brother, and maternal grandfather; Arthritis in  her mother; Depression in her brother; Heart disease in her father, maternal grandmother, and paternal grandfather; Vision loss in her mother.She reports that she has been smoking cigarettes. She started smoking about 42 years ago. She has a 85 pack-year smoking history. She has never used smokeless tobacco. She reports that she does not drink alcohol and does not use drugs.    ROS Review of Systems  Objective:  BP 116/83   Pulse (!) 107   Temp (!) 97.2 F (36.2 C)   Ht 5' (1.524 m)   Wt 138 lb (62.6 kg)   SpO2 96%   BMI 26.95 kg/m   BP Readings from Last 3 Encounters:  02/18/23 116/83  01/04/23 97/64  12/22/22 109/87    Wt Readings from Last 3 Encounters:  02/18/23 138 lb (62.6 kg)  01/06/23 125 lb (56.7 kg)  01/04/23 127 lb 12.8 oz (58 kg)     Physical Exam Constitutional:      General: She is in acute distress (pacing due to eft hip pain).  Appearance: She is well-developed.  Cardiovascular:     Rate and Rhythm: Normal rate and regular rhythm.  Pulmonary:     Breath sounds: Normal breath sounds.  Musculoskeletal:        General: Tenderness (left hip posteriorly between greater and lesser trochanter) present. Normal range of motion.  Skin:    General: Skin is warm and dry.  Neurological:     Mental Status: She is alert and oriented to person, place, and time.       Assessment & Plan:   Gina Hendricks was seen today for hip pain.  Diagnoses and all orders for this visit:  Trochanteric bursitis of left hip -     Ambulatory referral to Physical Therapy -     betamethasone acetate-betamethasone sodium phosphate (CELESTONE) injection 6 mg -     MR HIP LEFT WO CONTRAST; Future  Other orders -     mirtazapine (REMERON) 30 MG tablet; Take 1 tablet (30 mg total) by mouth at bedtime. For sleep -     nabumetone (RELAFEN) 500 MG tablet; Take 2 tablets (1,000 mg total) by mouth 2 (two) times daily. For muscle and joint pain       I have discontinued Gina D.  Hendricks's DULoxetine and predniSONE. I am also having her start on mirtazapine and nabumetone. Additionally, I am having her maintain her ibuprofen, nicotine, QUEtiapine, and hydrOXYzine. We will continue to administer betamethasone acetate-betamethasone sodium phosphate.  Allergies as of 02/18/2023       Reactions   Zoloft [sertraline Hcl] Other (See Comments)   "out of body experience"        Medication List        Accurate as of February 18, 2023 10:04 AM. If you have any questions, ask your nurse or doctor.          STOP taking these medications    DULoxetine 60 MG capsule Commonly known as: CYMBALTA Stopped by: Gina Hendricks   predniSONE 10 MG tablet Commonly known as: DELTASONE Stopped by: Gina Hendricks       TAKE these medications    hydrOXYzine 25 MG tablet Commonly known as: ATARAX Take 1 tablet (25 mg total) by mouth every 8 (eight) hours as needed. for anxiety **NEEDS TO BE SEEN BEFORE NEXT REFILL**   ibuprofen 200 MG tablet Commonly known as: ADVIL Take 200-400 mg by mouth every 6 (six) hours as needed for moderate pain.   mirtazapine 30 MG tablet Commonly known as: Remeron Take 1 tablet (30 mg total) by mouth at bedtime. For sleep Started by: Gina Hendricks   nabumetone 500 MG tablet Commonly known as: RELAFEN Take 2 tablets (1,000 mg total) by mouth 2 (two) times daily. For muscle and joint pain Started by: Broadus John Dalene Robards   nicotine 21 mg/24hr patch Commonly known as: NICODERM CQ - dosed in mg/24 hours Place 1 patch (21 mg total) onto the skin daily. (May buy from over the counter): For smoking cessation   QUEtiapine 300 MG 24 hr tablet Commonly known as: SEROQUEL XR TAKE 1 TABLET BY MOUTH EVERYDAY AT BEDTIME         Follow-up: Return in about 4 weeks (around 03/18/2023).  Mechele Claude, M.D.

## 2023-02-18 NOTE — Telephone Encounter (Signed)
Stacks pt NTBS 30-d given 01/25/23

## 2023-02-25 ENCOUNTER — Ambulatory Visit: Payer: 59 | Attending: Family Medicine | Admitting: Physical Therapy

## 2023-02-25 ENCOUNTER — Ambulatory Visit (HOSPITAL_COMMUNITY)
Admission: RE | Admit: 2023-02-25 | Discharge: 2023-02-25 | Disposition: A | Discharge status: Home / Self Care | Payer: 59 | Source: Ambulatory Visit | Attending: Family Medicine | Admitting: Family Medicine

## 2023-02-25 ENCOUNTER — Other Ambulatory Visit: Payer: Self-pay

## 2023-02-25 DIAGNOSIS — M7062 Trochanteric bursitis, left hip: Secondary | ICD-10-CM | POA: Diagnosis not present

## 2023-02-25 DIAGNOSIS — G8929 Other chronic pain: Secondary | ICD-10-CM | POA: Diagnosis not present

## 2023-02-25 DIAGNOSIS — M25552 Pain in left hip: Secondary | ICD-10-CM | POA: Diagnosis not present

## 2023-02-25 DIAGNOSIS — M160 Bilateral primary osteoarthritis of hip: Secondary | ICD-10-CM | POA: Diagnosis not present

## 2023-02-25 DIAGNOSIS — M62838 Other muscle spasm: Secondary | ICD-10-CM | POA: Insufficient documentation

## 2023-02-25 NOTE — Therapy (Signed)
OUTPATIENT PHYSICAL THERAPY LOWER EXTREMITY EVALUATION   Patient Name: Gina Hendricks MRN: [id number] DOB:[birth year], 62 y.o., female Today's Date: 02/25/2023  END OF SESSION:  PT End of Session - 02/25/23 1542     Visit Number 1    Number of Visits 12    Date for PT Re-Evaluation 05/26/23    Authorization Type FOTO.    PT Start Time 0316    PT Stop Time 0401    PT Time Calculation (min) 45 min    Activity Tolerance Patient tolerated treatment well    Behavior During Therapy [facility] for tasks assessed/performed             Past Medical History:  Diagnosis Date   Anxiety    Anxiety disorder, unspecified 08/23/2020   Depression    Heart murmur    Past Surgical History:  Procedure Laterality Date   FINGER SURGERY Right    right index finger   Patient Active Problem List   Diagnosis Date Noted   Left hip pain 01/04/2023   MDD (major depressive disorder) 08/26/2022   Anxiety 07/28/2021   Nausea 07/28/2021   GAD (generalized anxiety disorder) 08/23/2020   MDD (major depressive disorder), recurrent, severe, 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  THERAPY DIAG:  Left hip pain  Other muscle spasm  Rationale for Evaluation and Treatment: Rehabilitation  ONSET DATE: ~2 months  SUBJECTIVE:   SUBJECTIVE STATEMENT: The patient presents to the clinic with c/o left hip pain that came about for no known reason about two months ago.  She wonders if it might be due to a left knee injury many years ago.  Her pain is rated at a 6/10 today but can rise to higher levels with increased standing, walking and moving.  Rest and medication help decrease her pain.    PERTINENT HISTORY: See above. PAIN:   Are you having pain? Yes: NPRS scale: 6/10 Pain location: Left hip. Pain description: Ache, numb. Aggravating factors: See above. Relieving factors: See above.  PRECAUTIONS: None  WEIGHT BEARING RESTRICTIONS: No  FALLS:  Has patient fallen in last 6 months? No  LIVING ENVIRONMENT: Lives in: House/apartment Has following equipment at home: None  PLOF: Independent  PATIENT GOALS: Not have left hip pain.  OBJECTIVE:   DIAGNOSTIC FINDINGS: X-ray:  01/08/23:  IMPRESSION: Osteopenia.  No acute osseous abnormalities.  **She had a MRI today 02/25/23**  PALPATION: Very tender to palpation posterior to the left greater trochanter.  LOWER EXTREMITY ROM:  Normal left hip range of motion.  LOWER EXTREMITY MMT:  Normal left hip strength.  LOWER EXTREMITY SPECIAL TESTS:  Equal leg lengths.  GAIT: Very antalgic gait.  Patient in obvious pain and limits stance time over her left LE.  TODAY'S TREATMENT:  DATE:  Patient in right SDLY position with 2 pillow between  knees for comfort:   HMP and IFC at 80-150 Hz on 40% scan to patient's left hip x 20 minutes. Normal modality response following removal of modality.  PATIENT EDUCATION:  Education details: Sleeping posture with pillows between knees to keep hips in alignment Person educated: Patient Education method: Medical illustrator Education comprehension: verbalized understanding and returned demonstration  HOME EXERCISE PROGRAM:   ASSESSMENT:  CLINICAL IMPRESSION: The patient presents to OPPT with c/o left hip pain that has been ongoing for about two months.  She has normal left hip range of motion and her strength is normal.  She is very tender to palpation posterior to her left greater trochanter.  She has an antalgic gait pattern.  Her FOTO limitation score is 40.9.  Patient will benefit  from skilled physical therapy intervention to address pain and deficits.  OBJECTIVE IMPAIRMENTS: Abnormal gait, decreased activity tolerance, increased muscle spasms, and pain.   ACTIVITY LIMITATIONS: standing and locomotion level  PARTICIPATION LIMITATIONS: meal prep, cleaning, laundry, and yard work  [facility] POTENTIAL: Good  CLINICAL DECISION MAKING: Stable/uncomplicated  EVALUATION COMPLEXITY: Low   GOALS:  SHORT TERM GOALS: Target date: 03/11/23  Ind with a HEP. Goal status: INITIAL    LONG TERM GOALS: Target date: 05/26/23  Stand 20 minutes with left hip pain not > 2/10.  Goal status: INITIAL  2.  Walk a community distance with pain not > 2-3/10.  Goal status: INITIAL  3.  Perform ADL's with pain not > 2-3/10.  Goal status: INITIAL   PLAN:  PT FREQUENCY: 2x/week  PT DURATION: 6 weeks  PLANNED INTERVENTIONS: Therapeutic exercises, Therapeutic activity, Gait training, Patient/Family education, Self Care, Dry Needling, Cryotherapy, Moist heat, Ultrasound, and Manual therapy  PLAN FOR NEXT SESSION: Combo e'stim/US at 1.50 W/CM2, STW/M, dry needling.  Hip therex.      Dason Mosley, [place], PT 02/25/2023, 4:45 PM

## 2023-03-01 ENCOUNTER — Ambulatory Visit: Payer: 59

## 2023-03-04 ENCOUNTER — Ambulatory Visit: Payer: 59 | Attending: Family Medicine

## 2023-03-04 DIAGNOSIS — M25552 Pain in left hip: Secondary | ICD-10-CM | POA: Insufficient documentation

## 2023-03-04 DIAGNOSIS — M62838 Other muscle spasm: Secondary | ICD-10-CM | POA: Insufficient documentation

## 2023-03-04 NOTE — Therapy (Signed)
OUTPATIENT PHYSICAL THERAPY LOWER EXTREMITY TREATMENT   Patient Name: Gina Hendricks MRN: [id number] DOB:10-Jul-1960, 62 y.o., female Today's Date: 03/04/2023  END OF SESSION:  PT End of Session - 03/04/23 1608     Visit Number 2    Number of Visits 12    Date for PT Re-Evaluation 05/26/23    Authorization Type FOTO.    PT Start Time 1600    PT Stop Time 1645    PT Time Calculation (min) 45 min    Activity Tolerance Patient tolerated treatment well    Behavior During Therapy [facility] for tasks assessed/performed             Past Medical History:  Diagnosis Date   Anxiety    Anxiety disorder, unspecified 08/23/2020   Depression    Heart murmur    Past Surgical History:  Procedure Laterality Date   FINGER SURGERY Right    right index finger   Patient Active Problem List   Diagnosis Date Noted   Left hip pain 01/04/2023   MDD (major depressive disorder) 08/26/2022   Anxiety 07/28/2021   Nausea 07/28/2021   GAD (generalized anxiety disorder) 08/23/2020   MDD (major depressive disorder), recurrent, severe, 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  THERAPY DIAG:  Left hip pain  Other muscle spasm  Rationale for Evaluation and Treatment: Rehabilitation  ONSET DATE: ~2 months  SUBJECTIVE:   SUBJECTIVE STATEMENT: Pt reports 6/10 left hip pain.   PERTINENT HISTORY: See above. PAIN:  Are you having pain? Yes: NPRS scale: 6/10 Pain location: Left hip. Pain description: Ache, numb. Aggravating factors: See above. Relieving factors: See above.  PRECAUTIONS: None  WEIGHT BEARING RESTRICTIONS: No  FALLS:  Has patient fallen in last 6 months? No  LIVING ENVIRONMENT: Lives in:  House/apartment Has following equipment at home: None  PLOF: Independent  PATIENT GOALS: Not have left hip pain.  OBJECTIVE:   DIAGNOSTIC FINDINGS: X-ray:  01/08/23:  IMPRESSION: Osteopenia.  No acute osseous abnormalities.  **She had a MRI today 02/25/23**  PALPATION: Very tender to palpation posterior to the left greater trochanter.  LOWER EXTREMITY ROM:  Normal left hip range of motion.  LOWER EXTREMITY MMT:  Normal left hip strength.  LOWER EXTREMITY SPECIAL TESTS:  Equal leg lengths.  GAIT: Very antalgic gait.  Patient in obvious pain and limits stance time over her left LE.  TODAY'S TREATMENT:                                                                                                                              DATE:  EXERCISE LOG  Exercise Repetitions and Resistance Comments  Nustep Lvl 1 x 15 mins        Blank cell = exercise not performed today   Modalities  Date:  Combo: Hip, 1.5 w/cm2 100%, 12 mins, Pain  Manual Therapy Soft Tissue Mobilization: left hip, STW/M to left posterior hip to decrease pain and tone with pt positioned in right side-lying for comfort with pillow between her knees     PATIENT EDUCATION:  Education details: Sleeping posture with pillows between knees to keep hips in alignment Person educated: Patient Education method: Medical illustrator Education comprehension: verbalized understanding and returned demonstration  HOME EXERCISE PROGRAM:   ASSESSMENT:  CLINICAL IMPRESSION: Pt arrives for today's treatment session reporting 6/10 left hip pain.  Pt able to tolerate Nustep today for warm up without any issue.  STW/M performed to left posterior hip musculature to decrease pain and tone with pt positioned in right side-lying for comfort with pillow between her knees.  Normal responses to combo noted upon completion.  Pt denied estim and MH today.  Pt reported decreased pain at  completion of today's treatment session.  OBJECTIVE IMPAIRMENTS: Abnormal gait, decreased activity tolerance, increased muscle spasms, and pain.   ACTIVITY LIMITATIONS: standing and locomotion level  PARTICIPATION LIMITATIONS: meal prep, cleaning, laundry, and yard work  [facility] POTENTIAL: Good  CLINICAL DECISION MAKING: Stable/uncomplicated  EVALUATION COMPLEXITY: Low   GOALS:  SHORT TERM GOALS: Target date: 03/11/23  Ind with a HEP. Goal status: INITIAL    LONG TERM GOALS: Target date: 05/26/23  Stand 20 minutes with left hip pain not > 2/10.  Goal status: INITIAL  2.  Walk a community distance with pain not > 2-3/10.  Goal status: INITIAL  3.  Perform ADL's with pain not > 2-3/10.  Goal status: INITIAL   PLAN:  PT FREQUENCY: 2x/week  PT DURATION: 6 weeks  PLANNED INTERVENTIONS: Therapeutic exercises, Therapeutic activity, Gait training, Patient/Family education, Self Care, Dry Needling, Cryotherapy, Moist heat, Ultrasound, and Manual therapy  PLAN FOR NEXT SESSION: Combo e'stim/US at 1.50 W/CM2, STW/M, dry needling.  Hip therex.      Newman Pies, PTA 03/04/2023, 6:07 PM

## 2023-03-12 ENCOUNTER — Other Ambulatory Visit: Payer: Self-pay | Admitting: Family Medicine

## 2023-03-21 ENCOUNTER — Other Ambulatory Visit: Payer: Self-pay | Admitting: Family Medicine

## 2023-03-24 ENCOUNTER — Ambulatory Visit: Payer: 59 | Admitting: Family Medicine

## 2023-04-07 ENCOUNTER — Other Ambulatory Visit: Payer: Self-pay | Admitting: Family Medicine

## 2023-04-22 ENCOUNTER — Ambulatory Visit: Payer: 59 | Admitting: Family Medicine

## 2023-04-27 ENCOUNTER — Ambulatory Visit: Payer: 59 | Admitting: Family Medicine

## 2023-04-28 ENCOUNTER — Encounter: Payer: Self-pay | Admitting: Family Medicine

## 2023-04-29 ENCOUNTER — Other Ambulatory Visit: Payer: Self-pay | Admitting: Family Medicine

## 2023-05-17 ENCOUNTER — Ambulatory Visit (INDEPENDENT_AMBULATORY_CARE_PROVIDER_SITE_OTHER): Payer: 59 | Admitting: Family Medicine

## 2023-05-17 ENCOUNTER — Encounter: Payer: Self-pay | Admitting: Family Medicine

## 2023-05-17 VITALS — BP 132/87 | HR 111 | Temp 97.6°F | Ht 60.0 in | Wt 151.4 lb

## 2023-05-17 DIAGNOSIS — F333 Major depressive disorder, recurrent, severe with psychotic symptoms: Secondary | ICD-10-CM

## 2023-05-17 DIAGNOSIS — E785 Hyperlipidemia, unspecified: Secondary | ICD-10-CM

## 2023-05-17 DIAGNOSIS — R635 Abnormal weight gain: Secondary | ICD-10-CM | POA: Diagnosis not present

## 2023-05-17 DIAGNOSIS — R7303 Prediabetes: Secondary | ICD-10-CM

## 2023-05-17 NOTE — Progress Notes (Signed)
Subjective:  Patient ID: Gina Hendricks, female    DOB: 02/15/1961  Age: 62 y.o. MRN: [id number]  CC: Medical Management of Chronic Issues .  HPI Gina Hendricks presents for concerns about weight gain.  She is not sure why but she has gained 30 pounds in the last few months.  She has been taking Seroquel for depression and sleep.  She is not 300 mg and the weight gain seem to start about the time that dose was increased.  Additionally she is concerned about medical causes such as thyroid and cortisol.  She has been consuming just 1 meal a day for the last 3 months or so.  She eats a steak every day.     05/17/2023    4:02 PM 05/17/2023    3:58 PM 02/18/2023    9:29 AM  Depression screen PHQ 2/9  Decreased Interest 1 0   Down, Depressed, Hopeless 1 0   PHQ - 2 Score 2 0   Altered sleeping 1    Tired, decreased energy 1    Change in appetite 1    Feeling bad or failure about yourself  1    Trouble concentrating 1    Moving slowly or fidgety/restless 0    Suicidal thoughts 0  --  PHQ-9 Score 7    Difficult doing work/chores Somewhat difficult      History Gina Hendricks has a past medical history of Anxiety, Anxiety disorder, unspecified (08/23/2020), Depression, and Heart murmur.   She has a past surgical history that includes Finger surgery (Right).   Her family history includes Alcohol abuse in her brother, brother, and maternal grandfather; Arthritis in her mother; Depression in her brother; Heart disease in her father, maternal grandmother, and paternal grandfather; Vision loss in her mother.She reports that she has been smoking cigarettes. She started smoking about 42 years ago. She has a 85.5 pack-year smoking history. She has never used smokeless tobacco. She reports that she does not drink alcohol and does not use drugs.    ROS Review of Systems  Constitutional:  Positive for unexpected weight change (Weight gain of almost 30 pounds in the last 8 months).  HENT: Negative.     Eyes:  Negative for visual disturbance.  Respiratory:  Negative for shortness of breath.   Cardiovascular:  Negative for chest pain.  Gastrointestinal:  Negative for abdominal pain.  Musculoskeletal:  Negative for arthralgias.    Objective:  BP 132/87   Pulse (!) 111   Temp 97.6 F (36.4 C)   Ht 5' (1.524 m)   Wt 151 lb 6.4 oz (68.7 kg)   SpO2 94%   BMI 29.57 kg/m   BP Readings from Last 3 Encounters:  05/17/23 132/87  02/18/23 116/83  01/04/23 97/64    Wt Readings from Last 3 Encounters:  05/17/23 151 lb 6.4 oz (68.7 kg)  02/18/23 138 lb (62.6 kg)  01/06/23 125 lb (56.7 kg)     Physical Exam Constitutional:      General: She is not in acute distress.    Appearance: She is well-developed.  Cardiovascular:     Rate and Rhythm: Normal rate and regular rhythm.  Pulmonary:     Breath sounds: Normal breath sounds.  Musculoskeletal:        General: Normal range of motion.  Skin:    General: Skin is warm and dry.  Neurological:     Mental Status: She is alert and oriented to person, place, and time.  Assessment & Plan:   Mansi was seen today for medical management of chronic issues.  Diagnoses and all orders for this visit:  Prediabetes -     Bayer DCA Hb A1c Waived -     CBC with Differential/Platelet -     CMP14+EGFR -     TSH -     Cortisol-am, blood  Hyperlipidemia, unspecified hyperlipidemia type -     Lipid panel -     TSH -     Cortisol-am, blood  Weight gain, abnormal -     TSH -     Cortisol-am, blood -     Amb ref to Medical Nutrition Therapy-MNT  MDD (major depressive disorder), recurrent, severe, with psychosis (HCC)       I have discontinued Gina Hendricks's ibuprofen, nicotine, nabumetone, mirtazapine, and hydrOXYzine. I am also having her maintain her QUEtiapine.  Allergies as of 05/17/2023       Reactions   Zoloft [sertraline Hcl] Other (See Comments)   "out of body experience"        Medication List         Accurate as of May 17, 2023  6:03 PM. If you have any questions, ask your nurse or doctor.          STOP taking these medications    hydrOXYzine 25 MG tablet Commonly known as: ATARAX Stopped by: Gina Hendricks   ibuprofen 200 MG tablet Commonly known as: ADVIL Stopped by: Gina Hendricks   mirtazapine 30 MG tablet Commonly known as: REMERON Stopped by: Gina Hendricks   nabumetone 500 MG tablet Commonly known as: RELAFEN Stopped by: Gina Hendricks   nicotine 21 mg/24hr patch Commonly known as: NICODERM CQ - dosed in mg/24 hours Stopped by: Gina Hendricks       TAKE these medications    QUEtiapine 300 MG 24 hr tablet Commonly known as: SEROQUEL XR TAKE 1 TABLET BY MOUTH EVERYDAY AT BEDTIME         Follow-up: Return in about 3 months (around 08/15/2023).  Gina Hendricks, M.D.

## 2023-06-10 ENCOUNTER — Other Ambulatory Visit: Payer: Self-pay | Admitting: Family Medicine

## 2023-06-15 ENCOUNTER — Telehealth: Payer: Self-pay

## 2023-06-15 NOTE — Telephone Encounter (Signed)
 Copied from CRM [id number]. Topic: Appointments - Appointment Scheduling >> Jun 15, 2023  2:14 PM Isabell A wrote: Patient/patient representative is calling to schedule an appointment. Refer to attachments for appointment information.   Patient would like to establish care with Alysaa M. Allwardt.  Please see CRM message patient called wanting to establish care with Allwardt, please advise

## 2023-07-01 ENCOUNTER — Ambulatory Visit: Payer: 59 | Admitting: Nutrition

## 2023-07-09 ENCOUNTER — Encounter (INDEPENDENT_AMBULATORY_CARE_PROVIDER_SITE_OTHER): Payer: Self-pay | Admitting: *Deleted

## 2023-08-13 ENCOUNTER — Other Ambulatory Visit: Payer: Self-pay | Admitting: Family Medicine

## 2023-08-30 ENCOUNTER — Encounter: Payer: Self-pay | Admitting: Physician Assistant

## 2023-08-30 ENCOUNTER — Ambulatory Visit (INDEPENDENT_AMBULATORY_CARE_PROVIDER_SITE_OTHER): Payer: 59 | Admitting: Physician Assistant

## 2023-08-30 VITALS — BP 110/74 | HR 81 | Temp 97.9°F | Ht 60.0 in | Wt 139.4 lb

## 2023-08-30 DIAGNOSIS — F332 Major depressive disorder, recurrent severe without psychotic features: Secondary | ICD-10-CM

## 2023-08-30 DIAGNOSIS — G4700 Insomnia, unspecified: Secondary | ICD-10-CM

## 2023-08-30 DIAGNOSIS — R7303 Prediabetes: Secondary | ICD-10-CM

## 2023-08-30 DIAGNOSIS — E785 Hyperlipidemia, unspecified: Secondary | ICD-10-CM

## 2023-08-30 DIAGNOSIS — R5383 Other fatigue: Secondary | ICD-10-CM

## 2023-08-30 LAB — CBC WITH DIFFERENTIAL/PLATELET
Basophils Absolute: 0.1 10*3/uL (ref 0.0–0.1)
Basophils Relative: 0.6 % (ref 0.0–3.0)
Eosinophils Absolute: 0.4 10*3/uL (ref 0.0–0.7)
Eosinophils Relative: 4.1 % (ref 0.0–5.0)
HCT: 41.2 % (ref 36.0–46.0)
Hemoglobin: 13.8 g/dL (ref 12.0–15.0)
Lymphocytes Relative: 21.3 % (ref 12.0–46.0)
Lymphs Abs: 2.1 10*3/uL (ref 0.7–4.0)
MCHC: 33.5 g/dL (ref 30.0–36.0)
MCV: 92.8 fl (ref 78.0–100.0)
Monocytes Absolute: 0.5 10*3/uL (ref 0.1–1.0)
Monocytes Relative: 5.5 % (ref 3.0–12.0)
Neutro Abs: 6.7 10*3/uL (ref 1.4–7.7)
Neutrophils Relative %: 68.5 % (ref 43.0–77.0)
Platelets: 238 10*3/uL (ref 150.0–400.0)
RBC: 4.44 Mil/uL (ref 3.87–5.11)
RDW: 14.1 % (ref 11.5–15.5)
WBC: 9.9 10*3/uL (ref 4.0–10.5)

## 2023-08-30 LAB — LIPID PANEL
Cholesterol: 218 mg/dL — ABNORMAL HIGH (ref 0–200)
HDL: 37.7 mg/dL — ABNORMAL LOW (ref >39.00)
LDL Cholesterol: 147 mg/dL — ABNORMAL HIGH (ref 0–99)
NonHDL: 179.98
Total CHOL/HDL Ratio: 6
Triglycerides: 165 mg/dL — ABNORMAL HIGH (ref 0.0–149.0)
VLDL: 33 mg/dL (ref 0.0–40.0)

## 2023-08-30 LAB — TSH: TSH: 1.06 u[IU]/mL (ref 0.35–5.50)

## 2023-08-30 LAB — COMPREHENSIVE METABOLIC PANEL WITH GFR
ALT: 18 U/L (ref 0–35)
AST: 17 U/L (ref 0–37)
Albumin: 4.2 g/dL (ref 3.5–5.2)
Alkaline Phosphatase: 168 U/L — ABNORMAL HIGH (ref 39–117)
BUN: 15 mg/dL (ref 6–23)
CO2: 24 meq/L (ref 19–32)
Calcium: 9.4 mg/dL (ref 8.4–10.5)
Chloride: 110 meq/L (ref 96–112)
Creatinine, Ser: 0.56 mg/dL (ref 0.40–1.20)
GFR: 99.24 mL/min (ref >60.00)
Glucose, Bld: 95 mg/dL (ref 70–99)
Potassium: 4.2 meq/L (ref 3.5–5.1)
Sodium: 142 meq/L (ref 135–145)
Total Bilirubin: 0.4 mg/dL (ref 0.2–1.2)
Total Protein: 6.9 g/dL (ref 6.0–8.3)

## 2023-08-30 LAB — HEMOGLOBIN A1C: Hgb A1c MFr Bld: 5.8 % (ref 4.6–6.5)

## 2023-08-30 LAB — VITAMIN D 25 HYDROXY (VIT D DEFICIENCY, FRACTURES): VITD: 11.11 ng/mL — ABNORMAL LOW (ref 30.00–100.00)

## 2023-08-30 LAB — VITAMIN B12: Vitamin B-12: 697 pg/mL (ref 211–911)

## 2023-08-30 MED ORDER — RAMELTEON 8 MG PO TABS
8.0000 mg | ORAL_TABLET | Freq: Every day | ORAL | 2 refills | Status: DC
Start: 2023-08-30 — End: 2023-09-21

## 2023-08-30 MED ORDER — QUETIAPINE FUMARATE ER 300 MG PO TB24
300.0000 mg | ORAL_TABLET | Freq: Every day | ORAL | 1 refills | Status: DC
Start: 2023-08-30 — End: 2024-04-18

## 2023-08-30 NOTE — Patient Instructions (Addendum)
 Welcome to [facility]! It was a pleasure meeting you today.  Labs today - will reach out about results through Mychart   Start on Rozerem 8 mg at night for sleep if we can get this approved, if not, continue on the Seroquel (I sent both prescriptions).  Keep up good work!!    PLEASE NOTE:  If you had any LAB tests please let us know if you have not heard back within a few days. You may see your results on MyChart before we have a chance to review them but we will give you a call once they are reviewed by [place]. If we ordered any REFERRALS today, please let us know if you have not heard from their office within the next two weeks. Let us know through MyChart if you are needing REFILLS, or have your pharmacy send [place] the request. You can also use MyChart to communicate with me or any office staff.  Please try these tips to maintain a healthy lifestyle:  Eat most of your calories during the day when you are active. Eliminate processed foods including packaged sweets (pies, cakes, cookies), reduce intake of potatoes, white bread, white pasta, and white rice. Look for whole grain options, oat flour or Salih flour.  Each meal should contain half fruits/vegetables, one quarter protein, and one quarter carbs (no bigger than a computer mouse).  Cut down on sweet beverages. This includes juice, soda, and sweet tea. Also watch fruit intake, though this is a healthier sweet option, it still contains natural sugar! Limit to 3 servings daily.  Drink at least 1 glass of water with each meal and aim for at least 8 glasses (64 ounces) per day.  Exercise at least 150 minutes every week to the best of your ability.    Take Care,  Morningstar Toft, PA-C

## 2023-08-30 NOTE — Progress Notes (Signed)
 Patient ID: Gina Hendricks, female    DOB: Sep 08, 1960, 63 y.o.   MRN: [id number]   Assessment & Plan:  Insomnia, unspecified type -     Ramelteon; Take 1 tablet (8 mg total) by mouth at bedtime.  Dispense: 30 tablet; Refill: 2 -     QUEtiapine Fumarate ER; Take 1 tablet (300 mg total) by mouth at bedtime.  Dispense: 90 tablet; Refill: 1 -     VITAMIN D 25 Hydroxy (Vit-D Deficiency, Fractures) -     Vitamin B12  Prediabetes -     CBC with Differential/Platelet -     Comprehensive metabolic panel with GFR -     Lipid panel -     Hemoglobin A1c  Hyperlipidemia, unspecified hyperlipidemia type -     Lipid panel  MDD (major depressive disorder), recurrent severe, without psychosis (HCC) -     TSH -     VITAMIN D 25 Hydroxy (Vit-D Deficiency, Fractures) -     Vitamin B12  Other fatigue -     CBC with Differential/Platelet -     Comprehensive metabolic panel with GFR -     Lipid panel -     TSH -     Hemoglobin A1c -     VITAMIN D 25 Hydroxy (Vit-D Deficiency, Fractures) -     Vitamin B12      Assessment and Plan Assessment & Plan Insomnia Chronic insomnia likely related to long-term benzodiazepine use and withdrawal. She has used Xanax for over 30 years and experiences difficulty sleeping after discontinuation since 2021. Previously used Seroquel for sleep, which was effective but caused significant weight gain. Currently using over-the-counter sleep aids like Advil PM and melatonin with limited success. Rozerem (ramelteon), a prescription-based melatonin, is considered as an alternative to avoid weight gain associated with Seroquel. High Advil PM use necessitates checking kidney function. - Attempt to prescribe Rozerem (ramelteon) to improve sleep without the weight gain side effect. - If Rozerem is not approved, refill Seroquel to ensure adequate sleep. - Order blood tests to check kidney function due to high Advil PM use - advised discontinuing this.   Weight  Gain Significant weight gain possibly related to Seroquel use. She reports a 30-pound increase in weight, which coincided with Seroquel use. She has modified her diet to one meal a day with some fruit intake. Seroquel is known to cause weight gain, and this is considered in the decision to try Rozerem as an alternative. - Monitor weight and dietary habits. - Encourage balanced diet with adequate vegetable intake.  General Health Maintenance She is interested in understanding vitamin and mineral levels. No recent comprehensive blood work has been done. She is taking various supplements including probiotics, B complex, B12, zinc, St. John's wort, magnesium, ginger root, and green tea capsules. - Order blood tests to check vitamin B12, vitamin D, thyroid function, liver function, kidney function, blood sugar, and cholesterol levels.  Follow-up She is establishing care and requires follow-up to monitor health conditions and treatment efficacy. - Schedule follow-up appointment in two months to review blood test results and assess treatment efficacy.      Return in about 2 months (around 10/30/2023) for recheck/follow-up.    Subjective:    Chief Complaint  Patient presents with   New Patient (Initial Visit)    New Pt in office to est care with PCP; pt has concerns with some possible Vitamin deficiencies and would like to do lab work today; pt also has  issues with insomnia; pt is fasting today;     HPI Discussed the use of AI scribe software for clinical note transcription with the patient, who gave verbal consent to proceed.  History of Present Illness Gina Hendricks is a 63 year old female who presents with sleep disturbances and concerns about past Xanax use.   She has a history of long-term Xanax use, which she discontinued in late Sep 23, 2019 after using it for over 30 years. Initially prescribed for PMS, her dosage escalated to three 2 mg doses per day. She successfully weaned herself off  Xanax by gradually reducing the dose, despite experiencing withdrawal symptoms. Since stopping Xanax, she has noticed significant changes in her body and life.  She experiences significant sleep disturbances, often waking up at 2 AM and struggling to fall back asleep until 4:30 or 5 AM. She has been using Advil PM, sometimes taking up to six pills a night, but is concerned about the long-term effects on her kidneys. She has tried various supplements, including melatonin, GABA, and magnesium, to aid sleep but finds them insufficient. She previously used Seroquel for sleep, which was effective but led to a 30-pound weight gain. She stopped Seroquel when it was no longer refilled and is hesitant to rely on it again due to the weight gain and dependency concerns.  She is currently taking various supplements, including probiotics, B complex, B12, zinc, St. John's wort, magnesium, ginger root, and green tea capsules. She is cautious about using synthetic pharmaceuticals and prefers natural supplements. She has also experienced a loss of taste since 2019/09/23, which she attributes to a possible COVID-19 infection, although she is unsure if she ever had the virus.  Her past medical history includes a significant period of isolation and mental health struggles following her mother's death in 09/22/20. She has a family history of heart disease, with her father and grandfather having had massive heart attacks, and her sister reportedly having a heart attack as well.  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 No other current symptoms besides sleep disturbances and weight gain.     Past Medical History:  Diagnosis Date   Anxiety    Anxiety disorder, unspecified 08/23/2020   Depression    Heart murmur     Past Surgical History:  Procedure Laterality Date   FINGER SURGERY Right    right index finger    Family History  Problem Relation Age of Onset   Arthritis Mother    Vision loss  Mother    Pneumonia Mother    Heart failure Mother 42       passed in 09-22-20   Heart disease Father    Heart attack Sister    Alcohol abuse Brother    Depression Brother    Alcohol abuse Brother    Heart disease Maternal Grandmother    Alcohol abuse Maternal Grandfather    Heart disease Paternal Grandfather     Social History   Tobacco Use   Smoking status: Every Day    Current packs/day: 2.00    Average packs/day: 2.0 packs/day for 43.0 years (86.0 ttl pk-yrs)    Types: Cigarettes    Start date: 08/24/1980   Smokeless tobacco: Never  Vaping Use   Vaping status: Never Used  Substance Use Topics   Alcohol use: No   Drug use: No     Allergies  Allergen Reactions   Zoloft [Sertraline Hcl] Other (See Comments)    "out of body  experience"    Review of Systems NEGATIVE UNLESS OTHERWISE INDICATED IN HPI      Objective:     BP 110/74 (BP Location: Left Arm, Patient Position: Sitting, Cuff Size: Normal)   Pulse 81   Temp 97.9 F (36.6 C) (Temporal)   Ht 5' (1.524 m)   Wt 139 lb 6.4 oz (63.2 kg)   SpO2 98%   BMI 27.22 kg/m   Wt Readings from Last 3 Encounters:  08/30/23 139 lb 6.4 oz (63.2 kg)  05/17/23 151 lb 6.4 oz (68.7 kg)  02/18/23 138 lb (62.6 kg)    BP Readings from Last 3 Encounters:  08/30/23 110/74  05/17/23 132/87  02/18/23 116/83     Physical Exam Vitals and nursing note reviewed.  Constitutional:      Appearance: Normal appearance. She is obese. She is not toxic-appearing.  HENT:     Head: Normocephalic and atraumatic.     Right Ear: External ear normal.     Left Ear: External ear normal.  Eyes:     Extraocular Movements: Extraocular movements intact.     Conjunctiva/sclera: Conjunctivae normal.     Pupils: Pupils are equal, round, and reactive to light.  Cardiovascular:     Rate and Rhythm: Normal rate and regular rhythm.     Pulses: Normal pulses.     Heart sounds: Normal heart sounds.  Pulmonary:     Effort: Pulmonary effort is  normal.     Breath sounds: Normal breath sounds.  Musculoskeletal:        General: Normal range of motion.     Cervical back: Normal range of motion and neck supple.  Skin:    General: Skin is warm and dry.  Neurological:     General: No focal deficit present.     Mental Status: She is alert and oriented to person, place, and time.  Psychiatric:        Mood and Affect: Mood normal.        Behavior: Behavior normal.             Jalan Bodi M Danyele Smejkal, PA-C

## 2023-09-01 ENCOUNTER — Encounter: Payer: Self-pay | Admitting: Physician Assistant

## 2023-09-02 ENCOUNTER — Other Ambulatory Visit: Payer: Self-pay

## 2023-09-02 DIAGNOSIS — R899 Unspecified abnormal finding in specimens from other organs, systems and tissues: Secondary | ICD-10-CM

## 2023-09-02 DIAGNOSIS — R748 Abnormal levels of other serum enzymes: Secondary | ICD-10-CM

## 2023-09-02 MED ORDER — VITAMIN D (ERGOCALCIFEROL) 1.25 MG (50000 UNIT) PO CAPS: 50000.0000 [IU] | ORAL_CAPSULE | ORAL | 0 refills | Status: AC

## 2023-09-21 ENCOUNTER — Other Ambulatory Visit: Payer: Self-pay | Admitting: Physician Assistant

## 2023-09-21 DIAGNOSIS — G4700 Insomnia, unspecified: Secondary | ICD-10-CM

## 2023-10-23 ENCOUNTER — Other Ambulatory Visit: Payer: Self-pay | Admitting: Physician Assistant

## 2023-11-01 ENCOUNTER — Ambulatory Visit (INDEPENDENT_AMBULATORY_CARE_PROVIDER_SITE_OTHER): Admitting: Physician Assistant

## 2023-11-01 VITALS — BP 110/80 | HR 100 | Temp 97.3°F | Ht 60.0 in | Wt 149.2 lb

## 2023-11-01 DIAGNOSIS — G4700 Insomnia, unspecified: Secondary | ICD-10-CM | POA: Diagnosis not present

## 2023-11-01 DIAGNOSIS — R748 Abnormal levels of other serum enzymes: Secondary | ICD-10-CM

## 2023-11-01 DIAGNOSIS — E559 Vitamin D deficiency, unspecified: Secondary | ICD-10-CM

## 2023-11-01 DIAGNOSIS — F332 Major depressive disorder, recurrent severe without psychotic features: Secondary | ICD-10-CM | POA: Diagnosis not present

## 2023-11-01 LAB — HEPATIC FUNCTION PANEL
ALT: 16 U/L (ref 0–35)
AST: 17 U/L (ref 0–37)
Albumin: 4.1 g/dL (ref 3.5–5.2)
Alkaline Phosphatase: 157 U/L — ABNORMAL HIGH (ref 39–117)
Bilirubin, Direct: 0.1 mg/dL (ref 0.0–0.3)
Total Bilirubin: 0.3 mg/dL (ref 0.2–1.2)
Total Protein: 7.4 g/dL (ref 6.0–8.3)

## 2023-11-01 LAB — VITAMIN D 25 HYDROXY (VIT D DEFICIENCY, FRACTURES): VITD: 25.1 ng/mL — ABNORMAL LOW (ref 30.00–100.00)

## 2023-11-01 LAB — GAMMA GT: GGT: 18 U/L (ref 7–51)

## 2023-11-01 NOTE — Progress Notes (Signed)
 Patient ID: Gina Hendricks, female    DOB: 05-24-1961, 63 y.o.   MRN: [id number]   Assessment & Plan:  Elevated alkaline phosphatase level -     Hepatic function panel -     Gamma GT  Vitamin D  deficiency -     VITAMIN D  25 Hydroxy (Vit-D Deficiency, Fractures)  Severe episode of recurrent major depressive disorder, without psychotic features (HCC)  Insomnia, unspecified type      Assessment and Plan Assessment & Plan Chronic insomnia Chronic insomnia managed with Seroquel  and Ramelteon . Experiences difficulty sleeping once a week despite medication. Concerns about weight gain with Seroquel . Values sleep highly. - Continue Seroquel  and Ramelteon  for sleep management. - Encourage regular physical activity and outdoor exposure.  Chronic depression Chronic depression managed with disability support. Engages in volunteer work and reports finding joy in activities.  Elevated alkaline phosphatase Elevated alkaline phosphatase noted in March labs. Differential includes liver irritation, fatty liver, bony issues, or neoplasms. No jaundice or significant liver-related symptoms reported. Previous CT in July showed normal liver and gallbladder. Plan to further evaluate with hepatic function test and GGT to determine if elevation is liver-related or due to other causes. - Order hepatic function test and GGT.  Vitamin D  deficiency Vitamin D  deficiency previously treated with 50,000 unit once weekly dose. Completed the course. Limited sun exposure reported. - Check vitamin D  levels to assess response to supplementation.  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 Occasional discomfort reported, possibly related to physical activity such as golf. - Continue as needed use of Relafen  for hip discomfort (rare use per pt).      Return in about 6 months (around 05/02/2024) for recheck/follow-up.    Subjective:    Chief Complaint  Patient presents with    Medical Management of Chronic Issues    Pt in office for f/u and medication     HPI Discussed the use of AI scribe software for clinical note transcription with the patient, who gave verbal consent to proceed.  History of Present Illness Gina Hendricks is a 63 year old female who presents for follow-up on sleep issues and elevated liver function tests.  She experiences chronic insomnia, with difficulty falling asleep approximately once a week. She uses Seroquel  and Ramelteon  (Rozerem ) to aid her sleep, occasionally adjusting the dosage of Seroquel  by breaking the tablets. Despite these measures, she sometimes remains awake until the early morning hours.  She has a history of elevated alkaline phosphatase levels noted in March, with other liver function tests being normal. No regular alcohol consumption, drinking maybe once a year. No jaundice, yellowing of the eyes, or significant changes in stool color, although she noted a single instance of very dark stool recently.  She completed a course of vitamin D  supplementation, taking 50,000 units once weekly, and is unsure if she has any refills left. She has limited sun exposure but is attempting to increase outdoor activities, including playing golf once or twice a week.  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  She reports mild osteoarthritis in her hip joints, with occasional discomfort attributed to physical activity like golf. She uses Relafen  as needed for hip pain, preferring it over ibuprofen, but does not take it frequently.  No significant night sweats, fatigue, or nausea. General discomfort due to weight but no specific abdominal pain.  She experienced cramping and gastrointestinal discomfort after eating on a recent Sunday, which resolved after several trips to the bathroom.     Past Medical  History:  Diagnosis Date   Anxiety    Anxiety disorder, unspecified 08/23/2020   Depression    Heart murmur     Past Surgical History:  Procedure Laterality Date   FINGER SURGERY Right    right index finger    Family History  Problem Relation Age of Onset   Arthritis Mother    Vision loss Mother    Pneumonia Mother    Heart failure Mother 72       passed in 2022   Heart disease Father    Heart attack Sister    Alcohol abuse Brother    Depression Brother    Alcohol abuse Brother    Heart disease Maternal Grandmother    Alcohol abuse Maternal Grandfather    Heart disease Paternal Grandfather     Social History   Tobacco Use   Smoking status: Every Day    Current packs/day: 2.00    Average packs/day: 2.0 packs/day for 43.2 years (86.4 ttl pk-yrs)    Types: Cigarettes    Start date: 08/24/1980   Smokeless tobacco: Never  Vaping Use   Vaping status: Never Used  Substance Use Topics   Alcohol use: No   Drug use: No     Allergies  Allergen Reactions   Zoloft [Sertraline Hcl] Other (See Comments)    "out of body experience"    Review of Systems NEGATIVE UNLESS OTHERWISE INDICATED IN HPI      Objective:     BP 110/80 (BP Location: Left Arm, Patient Position: Sitting)   Pulse 100   Temp (!) 97.3 F (36.3 C) (Temporal)   Ht 5' (1.524 m)   Wt 149 lb 3.2 oz (67.7 kg)   SpO2 97%   BMI 29.14 kg/m   Wt Readings from Last 3 Encounters:  11/01/23 149 lb 3.2 oz (67.7 kg)  08/30/23 139 lb 6.4 oz (63.2 kg)  05/17/23 151 lb 6.4 oz (68.7 kg)    BP Readings from Last 3 Encounters:  11/01/23 110/80  08/30/23 110/74  05/17/23 132/87     Physical Exam Vitals and nursing note reviewed.  Constitutional:      Appearance: Normal appearance. She is obese. She is not toxic-appearing.  HENT:     Head: Normocephalic and atraumatic.     Right Ear: External ear normal.     Left Ear: External ear normal.  Eyes:     Extraocular Movements: Extraocular movements  intact.     Conjunctiva/sclera: Conjunctivae normal.     Pupils: Pupils are equal, round, and reactive to light.  Cardiovascular:     Rate and Rhythm: Normal rate and regular rhythm.     Pulses: Normal pulses.     Heart sounds: Normal heart sounds.  Pulmonary:     Effort: Pulmonary effort is normal.     Breath sounds: Normal breath sounds.  Musculoskeletal:        General: Normal range of motion.     Cervical back: Normal range of motion and neck supple.  Skin:    General: Skin is warm and dry.  Neurological:     General: No focal deficit present.     Mental Status: She is alert and oriented to person, place, and time.  Psychiatric:        Mood and Affect: Mood normal.  Behavior: Behavior normal.             Alonni Heimsoth M Crystallynn Noorani, PA-C

## 2023-11-02 ENCOUNTER — Ambulatory Visit: Payer: Self-pay | Admitting: Physician Assistant

## 2023-11-25 ENCOUNTER — Other Ambulatory Visit: Payer: Self-pay | Admitting: Physician Assistant

## 2023-11-28 ENCOUNTER — Other Ambulatory Visit: Payer: Self-pay | Admitting: Family Medicine

## 2023-12-21 ENCOUNTER — Other Ambulatory Visit: Payer: Self-pay | Admitting: Physician Assistant

## 2023-12-21 DIAGNOSIS — G4700 Insomnia, unspecified: Secondary | ICD-10-CM

## 2024-04-18 ENCOUNTER — Other Ambulatory Visit: Payer: Self-pay | Admitting: Physician Assistant

## 2024-04-18 DIAGNOSIS — G4700 Insomnia, unspecified: Secondary | ICD-10-CM

## 2024-04-18 NOTE — Telephone Encounter (Unsigned)
 Copied from CRM #[id number]. Topic: Clinical - Medication Refill >> Apr 18, 2024  1:58 PM Alfonso ORN wrote: Medication: QUEtiapine  (SEROQUEL  XR) 300 MG 24 hr tablet  Has the patient contacted their pharmacy? no  This is the patient's preferred pharmacy:  [id number] - [place], [place] - [address] [place] [address] Phone: [phone] Fax: [phone]  Is this the correct pharmacy for this prescription? Yes If no, delete pharmacy and type the correct one.   Has the prescription been filled recently? No  Is the patient out of the medication? Yes  Has the patient been seen for an appointment in the last year OR does the patient have an upcoming appointment? Yes (05/02/24)  Can we respond through MyChart? No  Pt requesting 10 day supply while awaiting December appt.

## 2024-04-19 MED ORDER — QUETIAPINE FUMARATE ER 300 MG PO TB24: 300.0000 mg | ORAL_TABLET | Freq: Every day | ORAL | 1 refills | Status: DC

## 2024-05-02 ENCOUNTER — Ambulatory Visit: Payer: Self-pay | Admitting: Physician Assistant

## 2024-05-02 ENCOUNTER — Telehealth: Payer: Self-pay | Admitting: Physician Assistant

## 2024-05-02 ENCOUNTER — Encounter: Payer: Self-pay | Admitting: Physician Assistant

## 2024-05-02 ENCOUNTER — Other Ambulatory Visit: Payer: Self-pay

## 2024-05-02 VITALS — BP 134/84 | HR 100 | Temp 97.2°F | Ht 60.0 in | Wt 155.0 lb

## 2024-05-02 DIAGNOSIS — E559 Vitamin D deficiency, unspecified: Secondary | ICD-10-CM

## 2024-05-02 DIAGNOSIS — Z0000 Encounter for general adult medical examination without abnormal findings: Secondary | ICD-10-CM

## 2024-05-02 DIAGNOSIS — R748 Abnormal levels of other serum enzymes: Secondary | ICD-10-CM

## 2024-05-02 DIAGNOSIS — G4700 Insomnia, unspecified: Secondary | ICD-10-CM

## 2024-05-02 DIAGNOSIS — K5909 Other constipation: Secondary | ICD-10-CM

## 2024-05-02 DIAGNOSIS — R5383 Other fatigue: Secondary | ICD-10-CM

## 2024-05-02 DIAGNOSIS — R7303 Prediabetes: Secondary | ICD-10-CM

## 2024-05-02 DIAGNOSIS — F17200 Nicotine dependence, unspecified, uncomplicated: Secondary | ICD-10-CM

## 2024-05-02 MED ORDER — RAMELTEON 8 MG PO TABS: 8.0000 mg | ORAL_TABLET | Freq: Every day | ORAL | 1 refills | Status: AC

## 2024-05-02 MED ORDER — QUETIAPINE FUMARATE ER 300 MG PO TB24: 300.0000 mg | ORAL_TABLET | Freq: Every day | ORAL | 1 refills | Status: AC

## 2024-05-02 NOTE — Progress Notes (Signed)
 Patient ID: Gina Hendricks, female    DOB: 11/07/1960, 63 y.o.   MRN: [id number]   Assessment & Plan:  Insomnia, unspecified type -     QUEtiapine  Fumarate ER; Take 1 tablet (300 mg total) by mouth at bedtime.  Dispense: 90 tablet; Refill: 1 -     Ramelteon ; Take 1 tablet (8 mg total) by mouth at bedtime.  Dispense: 90 tablet; Refill: 1  Elevated alkaline phosphatase level  Vitamin D  deficiency  Current every day smoker  Other constipation    Assessment & Plan  Assessment and Plan    Insomnia Chronic insomnia managed with Seroquel  and Ramelteon . Current regimen is effective with no reported side effects. She is satisfied with the current dose and administration schedule. - Continue Seroquel  XR 300 mg and Ramelteon  as prescribed. - Sent prescriptions to pharmacy.  Vitamin D  deficiency and elevated alkaline phosphatase Vitamin D  deficiency with elevated alkaline phosphatase. Normal GGT suggests liver function is not significantly impaired. Monitoring is necessary to assess the relationship between vitamin D  deficiency and alkaline phosphatase levels. - Will recheck vitamin D  and alkaline phosphatase levels at next visit in June.  Nicotine  dependence Long-standing nicotine  dependence with current smoking of one pack per day. She expresses interest in quitting but lacks a clear plan. Previous attempts to quit were unsuccessful. She is aware of the health risks associated with smoking and is considering future cessation efforts. - Encouraged smoking cessation efforts. - Discussed potential use of nicotine  patches and medications for smoking cessation.  Constipation Recent onset of constipation with dry bowel movements and increased straining. No significant dietary changes reported. Possible dehydration due to increased water intake during a recent trip. No new stressors or significant changes in bowel habits. - Recommended Miralax or magnesium  to alleviate constipation. -  Encouraged increased water intake.           Return in about 6 months (around 10/31/2024) for physical, fasting labs .    Subjective:    Chief Complaint  Patient presents with   Medical Management of Chronic Issues    Pt in office for 6 month follow up, pt states the medication for sleep has been working well. Pt admits also taken melatonin nightly to help with sleep.     HPI Discussed the use of AI scribe software for clinical note transcription with the patient, who gave verbal consent to proceed.  History of Present Illness  Discussed the use of AI scribe software for clinical note transcription with the patient, who gave verbal consent to proceed.  History of Present Illness   Gina Hendricks is a 63 year old female who presents for a follow-up visit for elevated alkaline phosphatase and vitamin D  deficiency.  She has a history of elevated alkaline phosphatase levels with normal GGT and vitamin D  deficiency.  She experiences chronic insomnia, managed with Seroquel  XR 300 mg, which she splits to take half before sleep and the other half later, and Ramelteon . She recently requested a refill for her medication.  She smokes about one pack of cigarettes a day and has been smoking since age 61. She has attempted to quit in the past without success and uses nicotine  patches during long flights to manage cravings.  She reports recent changes in bowel movements over the past two to three weeks, describing them as dry and requiring more effort. She has not significantly changed her diet but is trying to increase her water intake.  She has a history of  a kidney stone found on a previous MRI of the hip. She has not had a recent chest X-ray or CT scan and expressed fear about looking at her X-ray results.  She does not participate in preventive screenings such as mammograms or colon cancer screenings. She takes magnesium  daily and reports no swelling in her legs. She experiences numbness  in her arms when lying down, which she attributes to her sleeping position.         Past Medical History:  Diagnosis Date   Anxiety    Anxiety disorder, unspecified 08/23/2020   Depression    Heart murmur     Past Surgical History:  Procedure Laterality Date   FINGER SURGERY Right    right index finger    Family History  Problem Relation Age of Onset   Arthritis Mother    Vision loss Mother    Pneumonia Mother    Heart failure Mother 81       passed in 2022   Heart disease Father    Heart attack Sister    Alcohol abuse Brother    Depression Brother    Alcohol abuse Brother    Heart disease Maternal Grandmother    Alcohol abuse Maternal Grandfather    Heart disease Paternal Grandfather     Social History   Tobacco Use   Smoking status: Every Day    Current packs/day: 2.00    Average packs/day: 2.0 packs/day for 43.7 years (87.4 ttl pk-yrs)    Types: Cigarettes    Start date: 08/24/1980   Smokeless tobacco: Never  Vaping Use   Vaping status: Never Used  Substance Use Topics   Alcohol use: No   Drug use: No     Allergies  Allergen Reactions   Zoloft [Sertraline Hcl] Other (See Comments)    out of body experience    Review of Systems NEGATIVE UNLESS OTHERWISE INDICATED IN HPI      Objective:     BP 134/84 (BP Location: Left Arm, Patient Position: Sitting, Cuff Size: Normal)   Pulse 100   Temp (!) 97.2 F (36.2 C) (Temporal)   Ht 5' (1.524 m)   Wt 155 lb (70.3 kg)   SpO2 96%   BMI 30.27 kg/m   Wt Readings from Last 3 Encounters:  05/02/24 155 lb (70.3 kg)  11/01/23 149 lb 3.2 oz (67.7 kg)  08/30/23 139 lb 6.4 oz (63.2 kg)    BP Readings from Last 3 Encounters:  05/02/24 134/84  11/01/23 110/80  08/30/23 110/74     Physical Exam Vitals and nursing note reviewed.  Constitutional:      Appearance: Normal appearance. She is obese. She is not toxic-appearing.  HENT:     Head: Normocephalic and atraumatic.     Right Ear: External ear  normal.     Left Ear: External ear normal.  Eyes:     Extraocular Movements: Extraocular movements intact.     Conjunctiva/sclera: Conjunctivae normal.     Pupils: Pupils are equal, round, and reactive to light.  Cardiovascular:     Rate and Rhythm: Normal rate and regular rhythm.     Pulses: Normal pulses.     Heart sounds: Normal heart sounds.  Pulmonary:     Effort: Pulmonary effort is normal.     Breath sounds: Normal breath sounds.  Abdominal:     Palpations: Abdomen is soft. There is no mass.     Tenderness: There is no abdominal tenderness. There is no guarding.  Musculoskeletal:        General: Normal range of motion.     Cervical back: Normal range of motion and neck supple.     Right lower leg: No edema.     Left lower leg: No edema.  Skin:    General: Skin is warm and dry.     Findings: No lesion or rash.  Neurological:     General: No focal deficit present.     Mental Status: She is alert and oriented to person, place, and time.  Psychiatric:        Mood and Affect: Mood normal.        Behavior: Behavior normal.             Densel Kronick M Jah Alarid, PA-C

## 2024-05-02 NOTE — Telephone Encounter (Signed)
 Future lab orders placed, please make pt aware will need to complete labs no sooner than 1 wk prior to CPE appt

## 2024-05-02 NOTE — Telephone Encounter (Signed)
 Patient would like fasting labs prior to cpe

## 2024-05-03 NOTE — Telephone Encounter (Signed)
 Pt wants to do labs during CPE

## 2024-06-23 ENCOUNTER — Ambulatory Visit: Payer: Self-pay

## 2024-06-23 NOTE — Telephone Encounter (Signed)
 Called and spoke to patient to inquire if she made it to the ED or urgent care. Patient denied going to get treatment today as she expressed she decided to stay off the leg. Patient was advised to seem medical treatment in pain got worse overnight to ensure she will not be stuck with severe pain during the bad weather expected. Patient verbally expressed understanding and agreed to do so. No other questions or concerns were addressed during the call.

## 2024-06-23 NOTE — Telephone Encounter (Signed)
 FYI Only or Action Required?: FYI only for provider: UC with ED precautions, pt would not confirm if she will go.  Patient was last seen in primary care on 05/02/2024 by Allwardt, Mardy HERO, PA-C.  Called Nurse Triage reporting Leg Pain.  Symptoms began 7 weeks.  Interventions attempted: Rest, hydration, or home remedies.  Symptoms are: gradually worsening.  Triage Disposition: See HCP Within 4 Hours (Or PCP Triage)  Patient/caregiver understands and will follow disposition?: Unsure Reason for Disposition  [1] SEVERE pain (e.g., excruciating, unable to do any normal activities) AND [2] not improved after 2 hours of pain medicine  Answer Assessment - Initial Assessment Questions Pt reports right mid-calf pain described as the muscle pulling up or down. States radiates up to her thigh. Began 7 weeks ago intermittently, now constant x 2 weeks. Pain is 9/10 with movement, is mild when at rest.   Advised UC today, 911 if symptoms worsen or if SOB, chest pain or any new symptoms develop. Pt to call Monday to follow up.   1. ONSET: When did the pain start?      7 weeks, worse the past 2 weeks 2. LOCATION: Where is the pain located?      Right calf 3. PAIN: How bad is the pain?    (Scale 1-10; or mild, moderate, severe)     9/10, but mild when not ambulating 6. OTHER SYMPTOMS: Do you have any other symptoms? (e.g., chest pain, back pain, breathing difficulty, swelling, rash, fever, numbness, weakness)     Denies  Protocols used: Leg Pain-A-AH

## 2024-06-26 ENCOUNTER — Ambulatory Visit: Payer: Self-pay

## 2024-06-26 NOTE — Telephone Encounter (Signed)
 Noted and agreed, thank you.

## 2024-06-26 NOTE — Telephone Encounter (Signed)
 FYI Only or Action Required?: FYI only for provider: appointment scheduled on 06/28/24.  Patient was last seen in primary care on 05/02/2024 by Allwardt, Mardy HERO, PA-C.  Called Nurse Triage reporting calf pain.  Symptoms began 7 weeks ago .  Interventions attempted: OTC medications: Tylenol  and Ibuprofen .  Symptoms are: unchanged.  Triage Disposition: See PCP When Office is Open (Within 3 Days)  Patient/caregiver understands and will follow disposition?: Yes    Message from Harlene ORN sent at 06/26/2024  2:07 PM EST  Reason for Triage: something going on in her calf muscle; feels like it is pulling apart. Having trouble walking. Going on frequently unless she is sitting.   Reason for Disposition  [1] MODERATE pain (e.g., interferes with normal activities, limping) AND [2] present > 3 days  Answer Assessment - Initial Assessment Questions 1. ONSET: When did the pain start?      7 weeks  2. LOCATION: Where is the pain located?      Right calf  3. PAIN: How bad is the pain?    (Scale 1-10; or mild, moderate, severe)     Walking 8/10  sitting or laying down pain subsides// feels tight  4. WORK OR EXERCISE: Has there been any recent work or exercise that involved this part of the body?      no 5. CAUSE: What do you think is causing the leg pain?     Unknown  6. OTHER SYMPTOMS: Do you have any other symptoms? (e.g., chest pain, back pain, breathing difficulty, swelling, rash, fever, numbness, weakness)     none  Protocols used: Leg Pain-A-AH

## 2024-06-27 NOTE — Telephone Encounter (Signed)
 Noted pt scheduled for appt w/ Jesus

## 2024-06-28 ENCOUNTER — Ambulatory Visit (INDEPENDENT_AMBULATORY_CARE_PROVIDER_SITE_OTHER): Payer: Self-pay | Admitting: Internal Medicine

## 2024-06-28 ENCOUNTER — Encounter: Payer: Self-pay | Admitting: Internal Medicine

## 2024-06-28 VITALS — BP 136/80 | HR 94 | Temp 98.0°F | Ht 60.0 in | Wt 156.0 lb

## 2024-06-28 DIAGNOSIS — M79609 Pain in unspecified limb: Secondary | ICD-10-CM | POA: Diagnosis not present

## 2024-06-28 MED ORDER — KETOROLAC TROMETHAMINE 10 MG PO TABS: 10.0000 mg | ORAL_TABLET | Freq: Four times a day (QID) | ORAL | 0 refills | Status: AC | PRN

## 2024-06-28 MED ORDER — METHOCARBAMOL 500 MG PO TABS: 500.0000 mg | ORAL_TABLET | Freq: Four times a day (QID) | ORAL | 2 refills | Status: AC

## 2024-06-28 NOTE — Patient Instructions (Addendum)
 It was a pleasure seeing you today! Your health and satisfaction are our top priorities.  Bernardino Cone, MD  VISIT SUMMARY: Today, you were seen for right leg pain that has been ongoing for the past two and a half weeks. The pain has become more constant and unbearable when standing for long periods, and you also experience tingling down into your toes. You have no history of blood clots or clotting disorders, and there is no family history of blood clots. You have gained weight recently and have a history of dry skin and psychiatric issues.  YOUR PLAN: -RUPTURED BAKER'S CYST, RIGHT KNEE: A ruptured Baker's cyst occurs when fluid from the knee joint leaks into the surrounding tissue, causing pain and swelling. This condition can worsen with standing and may require intervention to alleviate symptoms. An urgent ultrasound of your right leg has been ordered to confirm the diagnosis and rule out deep vein thrombosis (DVT). You have been referred to an orthopedic surgeon for potential cyst drainage, and long-term management may include knee replacement surgery.  -RULE OUT DEEP VEIN THROMBOSIS, RIGHT LOWER EXTREMITY: Deep vein thrombosis (DVT) is a condition where a blood clot forms in a deep vein, usually in the legs. It is important to rule out DVT to prevent complications such as pulmonary embolism. An urgent ultrasound of your right leg has been ordered to help differentiate between a ruptured Baker's cyst and DVT.  INSTRUCTIONS: Please schedule an urgent ultrasound of your right leg as soon as possible to confirm the diagnosis and rule out DVT. Follow up with the orthopedic surgeon for potential cyst drainage and discuss the possibility of knee replacement surgery in the long term.  Your Providers PCP: Allwardt, Alyssa M, PA-C,  [phone]) Referring Provider: Allwardt, Alyssa M, PA-C,  [phone])  NEXT STEPS: [x]  Early Intervention: Schedule sooner appointment, call our on-call services, or  go to emergency room if there is any significant Increase in pain or discomfort New or worsening symptoms Sudden or severe changes in your health [x]  Flexible Follow-Up: We recommend a No follow-ups on file. for optimal routine care. This allows for progress monitoring and treatment adjustments. [x]  Preventive Care: Schedule your annual preventive care visit! It's typically covered by insurance and helps identify potential health issues early. [x]  Lab & X-ray Appointments: Incomplete tests scheduled today, or call to schedule. X-rays: [facility] (M-F, 8:30am-noon or 1pm-5pm). [x]  Medical Information Release: Sign a release form at front desk to obtain relevant medical information we don't have.  MAKING THE MOST OF OUR FOCUSED 20 MINUTE APPOINTMENTS: [x]   Clearly state your top concerns at the beginning of the visit to focus our discussion [x]   If you anticipate you will need more time, please inform the front desk during scheduling - we can book multiple appointments in the same week. [x]   If you have transportation problems- use our convenient video appointments or ask about transportation support. [x]   We can get down to business faster if you use MyChart to update information before the visit and submit non-urgent questions before your visit. Thank you for taking the time to provide details through MyChart.  Let our nurse know and she can import this information into your encounter documents.  Arrival and Wait Times: [x]   Arriving on time ensures that everyone receives prompt attention. [x]   Early morning (8a) and afternoon (1p) appointments tend to have shortest wait times. [x]   Unfortunately, we cannot delay appointments for late arrivals or hold slots during phone  calls.  Getting Answers and Following Up [x]   Simple Questions & Concerns: For quick questions or basic follow-up after your visit, reach us  at [phone] or MyChart messaging. [x]   Complex Concerns: If  your concern is more complex, scheduling an appointment might be best. Discuss this with the staff to find the most suitable option. [x]   Lab & Imaging Results: We'll contact you directly if results are abnormal or you don't use MyChart. Most normal results will be on MyChart within 2-3 business days, with a review message from Dr. Jesus. Haven't heard back in 2 weeks? Need results sooner? Contact us  at [phone]. [x]   Referrals: Our referral coordinator will manage specialist referrals. The specialist's office should contact you within 2 weeks to schedule an appointment. Call us  if you haven't heard from them after 2 weeks.  Staying Connected [x]   MyChart: Activate your MyChart for the fastest way to access results and message us . See the last page of this paperwork for instructions on how to activate.  Bring to Your Next Appointment [x]   Medications: Please bring all your medication bottles to your next appointment to ensure we have an accurate record of your prescriptions. [x]   Health Diaries: If you're monitoring any health conditions at home, keeping a diary of your readings can be very helpful for discussions at your next appointment.  Billing [x]   X-ray & Lab Orders: These are billed by separate companies. Contact the invoicing company directly for questions or concerns. [x]   Visit Charges: Discuss any billing inquiries with our administrative services team.  Your Satisfaction Matters [x]   Share Your Experience: We strive for your satisfaction! If you have any complaints, or preferably compliments, please let Dr. Jesus know directly or contact our Practice Administrators, Manuelita Rubin or [employer], by asking at the front desk.   Reviewing Your Records [x]   Review this early draft of your clinical encounter notes below and the final encounter summary tomorrow on MyChart after its been completed.  All orders placed so far are visible here: Popliteal pain -      Methocarbamol ; Take 1 tablet (500 mg total) by mouth 4 (four) times daily.  Dispense: 60 tablet; Refill: 2 -     Ambulatory referral to Orthopedic Surgery -     Ketorolac  Tromethamine ; Take 1 tablet (10 mg total) by mouth every 6 (six) hours as needed.  Dispense: 20 tablet; Refill: 0 -     VAS US  LOWER EXTREMITY VENOUS (DVT); Future      Transportation Needs: No Transportation Needs (01/06/2023)   PRAPARE - Administrator, Civil Service (Medical): No    Lack of Transportation (Non-Medical): No

## 2024-06-28 NOTE — Progress Notes (Signed)
 ==============================  [facility]: [phone]   -- Medical Office Visit --  Patient: Gina Hendricks      Age: 64 y.o.       Sex:  female  Date:   06/28/2024 Today's Healthcare Provider: Bernardino KANDICE Cone, MD  ==============================   Chief Complaint: Leg Pain (Right calf muscle hurting really bad happen couple mths ago )  Discussed the use of AI scribe software for clinical note transcription with the patient, who gave verbal consent to proceed.  History of Present Illness 63 year old female who presents with right leg pain to rule out a blood clot.  She has been experiencing right leg pain for the past two and a half weeks. Initially, the pain was sporadic and resolved on its own, but it has recently become more constant. The pain is located in the right leg, extending from the calf to the thigh and hamstring. It is described as unbearable when standing for extended periods and is accompanied by tingling down into the toes upon standing.  No history of blood clots, clotting disorders, or cancer. No family history of blood clots. She has not experienced similar leg pain in the past and denies any swelling associated with her dry skin. She has had fluid drawn from her knee in the past.  She mentions having gained weight, noting she used to weigh about sixty pounds less. She is currently on disability, not related to her current leg issue, and has a psychiatric history. She has had dry skin since the age of 79, which is present all over her body.  Background Reviewed: Problem List: has Depression, major, single episode, moderate (HCC); Right hip pain; MDD (major depressive disorder), recurrent severe, without psychosis (HCC); GAD (generalized anxiety disorder); Anxiety; Nausea; MDD (major depressive disorder); and Left hip pain on their problem list. Past Medical History:  has a past medical history of Anxiety, Anxiety disorder,  unspecified (08/23/2020), Depression, and Heart murmur. Past Surgical History:   has a past surgical history that includes Finger surgery (Right). Social History:   reports that she has been smoking cigarettes. She started smoking about 43 years ago. She has a 87.7 pack-year smoking history. She has never used smokeless tobacco. She reports that she does not drink alcohol and does not use drugs. Family History:  family history includes Alcohol abuse in her brother, brother, and maternal grandfather; Arthritis in her mother; Depression in her brother; Heart attack in her sister; Heart disease in her father, maternal grandmother, and paternal grandfather; Heart failure (age of onset: 65) in her mother; Pneumonia in her mother; Vision loss in her mother. Allergies:  is allergic to zoloft [sertraline hcl].   Medication Reconciliation: Current Outpatient Medications on File Prior to Visit  Medication Sig   cholecalciferol (VITAMIN D3) 25 MCG (1000 UNIT) tablet Take 2,000 Units by mouth 2 (two) times daily.   QUEtiapine  (SEROQUEL  XR) 300 MG 24 hr tablet Take 1 tablet (300 mg total) by mouth at bedtime.   ramelteon  (ROZEREM ) 8 MG tablet Take 1 tablet (8 mg total) by mouth at bedtime.   Vitamin D , Ergocalciferol , (DRISDOL ) 1.25 MG (50000 UNIT) CAPS capsule Take 1 capsule (50,000 Units total) by mouth every 7 (seven) days. (Patient not taking: Reported on 05/02/2024)   No current facility-administered medications on file prior to visit.  There are no discontinued medications.   Physical Exam:    06/28/2024    2:27 PM 05/02/2024   11:26 AM  11/01/2023   11:17 AM  Vitals with BMI  Height 5' 0 5' 0 5' 0  Weight 156 lbs 155 lbs 149 lbs 3 oz  BMI 30.47 30.27 29.14  Systolic 136 134 889  Diastolic 80 84 80  Pulse 94 100 100  Vital signs reviewed.  Nursing notes reviewed. Weight trend reviewed. Physical Activity: Insufficiently Active (01/06/2023)   Exercise Vital Sign    Days of Exercise per Week: 2 days     Minutes of Exercise per Session: 30 min   General Appearance:  No acute distress appreciable.   Well-groomed, healthy-appearing female.  Well proportioned with no abnormal fat distribution.  Good muscle tone. Pulmonary:  Normal work of breathing at rest, no respiratory distress apparent. SpO2: 98 %  Musculoskeletal: All extremities are intact.  Neurological:  Awake, alert, oriented, and engaged.  No obvious focal neurological deficits or cognitive impairments.  Sensorium seems unclouded.   Speech is clear and coherent with logical content. Psychiatric:  Appropriate mood, pleasant and cooperative demeanor, thoughtful and engaged during the exam   Verbalized to patient: Physical Exam EXTREMITIES: Baker's cyst present on right knee. Baker's cyst present on left knee, less severe. No cyanosis or edema. SKIN: Dry skin present on body /legs      05/02/2024    1:21 PM 11/01/2023   11:55 AM 08/30/2023   11:06 AM 05/17/2023    4:02 PM  PHQ 2/9 Scores  PHQ - 2 Score 0 0 0 2  PHQ- 9 Score 3 4  3  7       Data saved with a previous flowsheet row definition   Office Visit on 11/01/2023  Component Date Value Ref Range Status   Total Bilirubin 11/01/2023 0.3  0.2 - 1.2 mg/dL Final   Bilirubin, Direct 11/01/2023 0.1  0.0 - 0.3 mg/dL Final   Alkaline Phosphatase 11/01/2023 157 (H)  39 - 117 U/L Final   AST 11/01/2023 17  0 - 37 U/L Final   ALT 11/01/2023 16  0 - 35 U/L Final   Total Protein 11/01/2023 7.4  6.0 - 8.3 g/dL Final   Albumin 93/97/7974 4.1  3.5 - 5.2 g/dL Final   VITD 93/97/7974 25.10 (L)  30.00 - 100.00 ng/mL Final   GGT 11/01/2023 18  7 - 51 U/L Final  Office Visit on 08/30/2023  Component Date Value Ref Range Status   WBC 08/30/2023 9.9  4.0 - 10.5 K/uL Final   RBC 08/30/2023 4.44  3.87 - 5.11 Mil/uL Final   Hemoglobin 08/30/2023 13.8  12.0 - 15.0 g/dL Final   HCT 96/68/7974 41.2  36.0 - 46.0 % Final   MCV 08/30/2023 92.8  78.0 - 100.0 fl Final   MCHC 08/30/2023 33.5  30.0 -  36.0 g/dL Final   RDW 96/68/7974 14.1  11.5 - 15.5 % Final   Platelets 08/30/2023 238.0  150.0 - 400.0 K/uL Final   Neutrophils Relative % 08/30/2023 68.5  43.0 - 77.0 % Final   Lymphocytes Relative 08/30/2023 21.3  12.0 - 46.0 % Final   Monocytes Relative 08/30/2023 5.5  3.0 - 12.0 % Final   Eosinophils Relative 08/30/2023 4.1  0.0 - 5.0 % Final   Basophils Relative 08/30/2023 0.6  0.0 - 3.0 % Final   Neutro Abs 08/30/2023 6.7  1.4 - 7.7 K/uL Final   Lymphs Abs 08/30/2023 2.1  0.7 - 4.0 K/uL Final   Monocytes Absolute 08/30/2023 0.5  0.1 - 1.0 K/uL Final   Eosinophils Absolute 08/30/2023 0.4  0.0 - 0.7 K/uL Final   Basophils Absolute 08/30/2023 0.1  0.0 - 0.1 K/uL Final   Sodium 08/30/2023 142  135 - 145 mEq/L Final   Potassium 08/30/2023 4.2  3.5 - 5.1 mEq/L Final   Chloride 08/30/2023 110  96 - 112 mEq/L Final   CO2 08/30/2023 24  19 - 32 mEq/L Final   Glucose, Bld 08/30/2023 95  70 - 99 mg/dL Final   BUN 96/68/7974 15  6 - 23 mg/dL Final   Creatinine, Ser 08/30/2023 0.56  0.40 - 1.20 mg/dL Final   Total Bilirubin 08/30/2023 0.4  0.2 - 1.2 mg/dL Final   Alkaline Phosphatase 08/30/2023 168 (H)  39 - 117 U/L Final   AST 08/30/2023 17  0 - 37 U/L Final   ALT 08/30/2023 18  0 - 35 U/L Final   Total Protein 08/30/2023 6.9  6.0 - 8.3 g/dL Final   Albumin 96/68/7974 4.2  3.5 - 5.2 g/dL Final   GFR 96/68/7974 99.24  >60.00 mL/min Final   Calcium 08/30/2023 9.4  8.4 - 10.5 mg/dL Final   Cholesterol 96/68/7974 218 (H)  0 - 200 mg/dL Final   Triglycerides 96/68/7974 165.0 (H)  0.0 - 149.0 mg/dL Final   HDL 96/68/7974 37.70 (L)  >60.99 mg/dL Final   VLDL 96/68/7974 33.0  0.0 - 40.0 mg/dL Final   LDL Cholesterol 08/30/2023 147 (H)  0 - 99 mg/dL Final   Total CHOL/HDL Ratio 08/30/2023 6   Final   NonHDL 08/30/2023 179.98   Final   TSH 08/30/2023 1.06  0.35 - 5.50 uIU/mL Final   Hgb A1c MFr Bld 08/30/2023 5.8  4.6 - 6.5 % Final   VITD 08/30/2023 11.11 (L)  30.00 - 100.00 ng/mL Final    Vitamin B-12 08/30/2023 697  211 - 911 pg/mL Final  Admission on 12/22/2022, Discharged on 12/22/2022  Component Date Value Ref Range Status   Lipase 12/22/2022 28  11 - 51 U/L Final   Sodium 12/22/2022 138  135 - 145 mmol/L Final   Potassium 12/22/2022 3.4 (L)  3.5 - 5.1 mmol/L Final   Chloride 12/22/2022 108  98 - 111 mmol/L Final   CO2 12/22/2022 20 (L)  22 - 32 mmol/L Final   Glucose, Bld 12/22/2022 120 (H)  70 - 99 mg/dL Final   BUN 92/76/7975 9  6 - 20 mg/dL Final   Creatinine, Ser 12/22/2022 0.63  0.44 - 1.00 mg/dL Final   Calcium 92/76/7975 9.2  8.9 - 10.3 mg/dL Final   Total Protein 92/76/7975 8.3 (H)  6.5 - 8.1 g/dL Final   Albumin 92/76/7975 4.2  3.5 - 5.0 g/dL Final   AST 92/76/7975 30  15 - 41 U/L Final   ALT 12/22/2022 31  0 - 44 U/L Final   Alkaline Phosphatase 12/22/2022 120  38 - 126 U/L Final   Total Bilirubin 12/22/2022 0.3  0.3 - 1.2 mg/dL Final   GFR, Estimated 12/22/2022 >60  >60 mL/min Final   Anion gap 12/22/2022 10  5 - 15 Final   WBC 12/22/2022 11.5 (H)  4.0 - 10.5 K/uL Final   RBC 12/22/2022 4.38  3.87 - 5.11 MIL/uL Final   Hemoglobin 12/22/2022 13.4  12.0 - 15.0 g/dL Final   HCT 92/76/7975 40.0  36.0 - 46.0 % Final   MCV 12/22/2022 91.3  80.0 - 100.0 fL Final   MCH 12/22/2022 30.6  26.0 - 34.0 pg Final   MCHC 12/22/2022 33.5  30.0 - 36.0 g/dL Final  RDW 12/22/2022 13.2  11.5 - 15.5 % Final   Platelets 12/22/2022 263  150 - 400 K/uL Final   nRBC 12/22/2022 0.0  0.0 - 0.2 % Final   Color, Urine 12/22/2022 YELLOW  YELLOW Final   APPearance 12/22/2022 CLEAR  CLEAR Final   Specific Gravity, Urine 12/22/2022 1.010  1.005 - 1.030 Final   pH 12/22/2022 7.0  5.0 - 8.0 Final   Glucose, UA 12/22/2022 NEGATIVE  NEGATIVE mg/dL Final   Hgb urine dipstick 12/22/2022 NEGATIVE  NEGATIVE Final   Bilirubin Urine 12/22/2022 NEGATIVE  NEGATIVE Final   Ketones, ur 12/22/2022 5 (A)  NEGATIVE mg/dL Final   Protein, ur 92/76/7975 30 (A)  NEGATIVE mg/dL Final   Nitrite  92/76/7975 NEGATIVE  NEGATIVE Final   Leukocytes,Ua 12/22/2022 NEGATIVE  NEGATIVE Final   RBC / HPF 12/22/2022 0-5  0 - 5 RBC/hpf Final   WBC, UA 12/22/2022 0-5  0 - 5 WBC/hpf Final   Bacteria, UA 12/22/2022 NONE SEEN  NONE SEEN Final   Squamous Epithelial / HPF 12/22/2022 0-5  0 - 5 /HPF Final   Mucus 12/22/2022 PRESENT   Final   Glucose-Capillary 12/22/2022 129 (H)  70 - 99 mg/dL Final  Admission on 96/73/7975, Discharged on 08/26/2022  Component Date Value Ref Range Status   Sodium 08/25/2022 137  135 - 145 mmol/L Final   Potassium 08/25/2022 3.3 (L)  3.5 - 5.1 mmol/L Final   Chloride 08/25/2022 105  98 - 111 mmol/L Final   CO2 08/25/2022 23  22 - 32 mmol/L Final   Glucose, Bld 08/25/2022 148 (H)  70 - 99 mg/dL Final   BUN 96/73/7975 8  6 - 20 mg/dL Final   Creatinine, Ser 08/25/2022 0.58  0.44 - 1.00 mg/dL Final   Calcium 96/73/7975 9.0  8.9 - 10.3 mg/dL Final   Total Protein 96/73/7975 6.6  6.5 - 8.1 g/dL Final   Albumin 96/73/7975 3.4 (L)  3.5 - 5.0 g/dL Final   AST 96/73/7975 15  15 - 41 U/L Final   ALT 08/25/2022 12  0 - 44 U/L Final   Alkaline Phosphatase 08/25/2022 113  38 - 126 U/L Final   Total Bilirubin 08/25/2022 0.5  0.3 - 1.2 mg/dL Final   GFR, Estimated 08/25/2022 >60  >60 mL/min Final   Anion gap 08/25/2022 9  5 - 15 Final   Alcohol, Ethyl (B) 08/25/2022 <10  <10 mg/dL Final   Opiates 96/73/7975 NONE DETECTED  NONE DETECTED Final   Cocaine 08/25/2022 NONE DETECTED  NONE DETECTED Final   Benzodiazepines 08/25/2022 NONE DETECTED  NONE DETECTED Final   Amphetamines 08/25/2022 NONE DETECTED  NONE DETECTED Final   Tetrahydrocannabinol 08/25/2022 NONE DETECTED  NONE DETECTED Final   Barbiturates 08/25/2022 NONE DETECTED  NONE DETECTED Final   WBC 08/25/2022 10.3  4.0 - 10.5 K/uL Final   RBC 08/25/2022 4.61  3.87 - 5.11 MIL/uL Final   Hemoglobin 08/25/2022 14.2  12.0 - 15.0 g/dL Final   HCT 96/73/7975 43.4  36.0 - 46.0 % Final   MCV 08/25/2022 94.1  80.0 - 100.0 fL  Final   MCH 08/25/2022 30.8  26.0 - 34.0 pg Final   MCHC 08/25/2022 32.7  30.0 - 36.0 g/dL Final   RDW 96/73/7975 13.6  11.5 - 15.5 % Final   Platelets 08/25/2022 261  150 - 400 K/uL Final   nRBC 08/25/2022 0.0  0.0 - 0.2 % Final   Neutrophils Relative % 08/25/2022 72  % Final   Neutro  Abs 08/25/2022 7.4  1.7 - 7.7 K/uL Final   Lymphocytes Relative 08/25/2022 21  % Final   Lymphs Abs 08/25/2022 2.2  0.7 - 4.0 K/uL Final   Monocytes Relative 08/25/2022 4  % Final   Monocytes Absolute 08/25/2022 0.4  0.1 - 1.0 K/uL Final   Eosinophils Relative 08/25/2022 1  % Final   Eosinophils Absolute 08/25/2022 0.1  0.0 - 0.5 K/uL Final   Basophils Relative 08/25/2022 1  % Final   Basophils Absolute 08/25/2022 0.1  0.0 - 0.1 K/uL Final   Immature Granulocytes 08/25/2022 1  % Final   Abs Immature Granulocytes 08/25/2022 0.05  0.00 - 0.07 K/uL Final   Acetaminophen  (Tylenol ), Serum 08/25/2022 <10 (L)  10 - 30 ug/mL Final   Salicylate Lvl 08/25/2022 <7.0 (L)  7.0 - 30.0 mg/dL Final   Color, Urine 96/73/7975 YELLOW  YELLOW Final   APPearance 08/25/2022 CLEAR  CLEAR Final   Specific Gravity, Urine 08/25/2022 1.013  1.005 - 1.030 Final   pH 08/25/2022 6.0  5.0 - 8.0 Final   Glucose, UA 08/25/2022 NEGATIVE  NEGATIVE mg/dL Final   Hgb urine dipstick 08/25/2022 NEGATIVE  NEGATIVE Final   Bilirubin Urine 08/25/2022 NEGATIVE  NEGATIVE Final   Ketones, ur 08/25/2022 NEGATIVE  NEGATIVE mg/dL Final   Protein, ur 96/73/7975 NEGATIVE  NEGATIVE mg/dL Final   Nitrite 96/73/7975 NEGATIVE  NEGATIVE Final   Leukocytes,Ua 08/25/2022 NEGATIVE  NEGATIVE Final   SARS Coronavirus 2 by RT PCR 08/26/2022 NEGATIVE  NEGATIVE Final   Influenza A by PCR 08/26/2022 NEGATIVE  NEGATIVE Final   Influenza B by PCR 08/26/2022 NEGATIVE  NEGATIVE Final   Resp Syncytial Virus by PCR 08/26/2022 NEGATIVE  NEGATIVE Final        ASSESSMENT & PLAN   Assessment & Plan Popliteal pain Ruptured Baker's cyst, right knee most likely  diagnosis  A ruptured Baker's cyst in the right knee is suspected, likely due to rupture of the knee capsule and fluid back pressure. Symptoms include pain, tingling, and fluid pressure causing tissue tearing and nerve pain. The condition is chronic and worsens with standing. Differential diagnosis includes deep vein thrombosis (DVT), which must be ruled out. Although not life-threatening, intervention is needed to alleviate symptoms. Long-term management may require knee replacement surgery. An urgent ultrasound of the right leg is ordered to confirm the diagnosis and rule out DVT. Referral to an orthopedic surgeon is made for potential cyst drainage. The potential need for knee replacement surgery in the long term is discussed.  Rule out deep vein thrombosis, right lower extremity due to pain in calf and thigh its not safe to presume with certainty that this is a bakers cyst pain only DVT must be ruled out due to leg pain, despite low suspicion. Confirming the diagnosis is necessary to prevent complications like pulmonary embolism. The ultrasound will help differentiate between a ruptured Baker's cyst and DVT. An urgent ultrasound of the right leg is ordered to rule out DVT.  ORDER ASSOCIATIONS  #   DIAGNOSIS / CONDITION ICD-10 ENCOUNTER ORDER     ICD-10-CM   1. Popliteal pain  M79.609 methocarbamol  (ROBAXIN ) 500 MG tablet    Ambulatory referral to Orthopedic Surgery    ketorolac  (TORADOL ) 10 MG tablet    VAS US  LOWER EXTREMITY VENOUS (DVT)         Orders Placed in Encounter:    Imaging Orders         VAS US  LOWER EXTREMITY VENOUS (DVT)  Referral Orders         Ambulatory referral to Orthopedic Surgery     Meds ordered this encounter  Medications   methocarbamol  (ROBAXIN ) 500 MG tablet    Sig: Take 1 tablet (500 mg total) by mouth 4 (four) times daily.    Dispense:  60 tablet    Refill:  2   ketorolac  (TORADOL ) 10 MG tablet    Sig: Take 1 tablet (10 mg total) by mouth every 6  (six) hours as needed.    Dispense:  20 tablet    Refill:  0        This document was synthesized by artificial intelligence ([employer]) using HIPAA-compliant recording of the clinical interaction;   We discussed the use of AI scribe software for clinical note transcription with the patient, who gave verbal consent to proceed. additional Info: This encounter employed state-of-the-art, real-time, collaborative documentation. The patient actively reviewed and assisted in updating their electronic medical record on a shared screen, ensuring transparency and facilitating joint problem-solving for the problem list, overview, and plan. This approach promotes accurate, informed care. The treatment plan was discussed and reviewed in detail, including medication safety, potential side effects, and all patient questions. We confirmed understanding and comfort with the plan. Follow-up instructions were established, including contacting the office for any concerns, returning if symptoms worsen, persist, or new symptoms develop, and precautions for potential emergency department visits.

## 2024-06-29 ENCOUNTER — Ambulatory Visit: Payer: Self-pay | Admitting: Internal Medicine

## 2024-06-29 ENCOUNTER — Ambulatory Visit: Payer: Self-pay | Admitting: *Deleted

## 2024-06-29 ENCOUNTER — Ambulatory Visit (HOSPITAL_COMMUNITY)
Admission: RE | Admit: 2024-06-29 | Discharge: 2024-06-29 | Disposition: A | Discharge status: Home / Self Care | Payer: Self-pay | Source: Ambulatory Visit | Attending: Internal Medicine | Admitting: Internal Medicine

## 2024-06-29 DIAGNOSIS — M79609 Pain in unspecified limb: Secondary | ICD-10-CM | POA: Insufficient documentation

## 2024-06-29 NOTE — Telephone Encounter (Signed)
 Please see triage note as FYI only

## 2024-06-29 NOTE — Telephone Encounter (Signed)
 FYI Only or Action Required?: FYI only for provider: Ortho [facility] advised.  Patient was last seen in primary care on 06/28/2024 by Gina Bernardino MATSU, MD.  Called Nurse Triage reporting Leg Injury.  Symptoms began yesterday.  Interventions attempted: Rest, hydration, or home remedies.  Symptoms are: gradually worsening.  Triage Disposition: Go to ED Now (Notify PCP)  Patient/caregiver understands and will follow disposition?: No, refuses disposition   Message from [facility] sent at 06/29/2024  8:37 AM EST  Reason for Triage: In yesterday for right leg and what she was given for pain isn't working, slipped on ice yesterday after leaving appointment yesterday and twisted other leg left leg. Only can put 20% weight on it and in pain as well, worse than the one she's getting an ultrasound on. Provider is sending her for ultrasound and wants to know if he can change order to have both done. Patient states she needs something for pain can't hardly take pain   Reason for Disposition  Can't stand (bear weight) or walk  Answer Assessment - Initial Assessment Questions Patient is requesting PCP order scan for her new injury- patient advised she will have to be evaluated to decide best course of evaluation and treatment. Can not give medications until seen. Patient declined ED- she does agree to go to Ortho UC    1. MECHANISM: How did the injury happen? (e.g., twisting injury, direct blow)      Slid on ice getting out of car 2. ONSET: When did the injury happen? (e.g., minutes, hours ago)      yesterday 3. LOCATION: Where is the injury located?      Left leg- pain in back of knee and front of leg 4. APPEARANCE of INJURY: What does the injury look like?  (e.g., deformity of leg)     Swelling on R side of knee cap 5. SEVERITY: Can you put weight on that leg? Can you walk?      Not very much-feels like it is going to give way 6. SIZE: For cuts, bruises, or swelling, ask: How large is  it? (e.g., inches or centimeters)      Swelling on knee only 7. PAIN: Is there pain? If Yes, ask: How bad is the pain?   What does it keep you from doing? (Scale 0-10; or none, mild, moderate, severe)     11/10  9. OTHER SYMPTOMS: Do you have any other symptoms?      Patient has US  scheduled today  Answer Assessment - Initial Assessment Questions 1. MECHANISM: How did the injury happen? (e.g., twisting injury, direct blow)      L knee 2. ONSET: When did the injury happen? (e.g., minutes, hours ago)      [name]- getting out of her car- slid on ice 3. LOCATION: Where is the injury located?      L knee 4. APPEARANCE of INJURY: What does the injury look like?      Some swelling R side of knee cap 5. SEVERITY: Can you put weight on that leg? Can you walk?      Patient using support when walking  7. PAIN: Is there pain? If Yes, ask: How bad is the pain?   What does it keep you from doing? (Scale 0-10; or none, mild, moderate, severe)     10/10  9. OTHER SYMPTOMS: Do you have any other symptoms?  (e.g., pop when knee injured, swelling, locking, buckling)      Swelling, feels like it  will give out  Protocols used: Leg Injury-A-AH, Knee Injury-A-AH

## 2024-07-05 ENCOUNTER — Ambulatory Visit: Admitting: Physician Assistant

## 2024-11-07 ENCOUNTER — Encounter: Payer: Self-pay | Admitting: Physician Assistant
